# Patient Record
Sex: Male | Born: 1935
Health system: Southern US, Community
[De-identification: ages and names within clinical notes are randomized; demographics above are authoritative.]

## PROBLEM LIST (undated history)

## (undated) VITALS — BP 132/80 | HR 62 | Wt 167.0 lb

## (undated) DIAGNOSIS — E785 Hyperlipidemia, unspecified: Secondary | ICD-10-CM

## (undated) DIAGNOSIS — C4490 Unspecified malignant neoplasm of skin, unspecified: Secondary | ICD-10-CM

## (undated) DIAGNOSIS — G20 Parkinson's disease: Principal | ICD-10-CM

## (undated) DIAGNOSIS — K449 Diaphragmatic hernia without obstruction or gangrene: Secondary | ICD-10-CM

## (undated) DIAGNOSIS — G500 Trigeminal neuralgia: Secondary | ICD-10-CM

## (undated) DIAGNOSIS — T7840XA Allergy, unspecified, initial encounter: Secondary | ICD-10-CM

## (undated) DIAGNOSIS — C61 Malignant neoplasm of prostate: Secondary | ICD-10-CM

## (undated) HISTORY — DX: Diaphragmatic hernia without obstruction or gangrene: K44.9

## (undated) HISTORY — DX: Malignant neoplasm of prostate: C61

## (undated) HISTORY — DX: Trigeminal neuralgia: G50.0

## (undated) HISTORY — DX: Hyperlipidemia, unspecified: E78.5

## (undated) HISTORY — DX: Unspecified malignant neoplasm of skin, unspecified: C44.90

## (undated) HISTORY — DX: Allergy, unspecified, initial encounter: T78.40XA

## (undated) HISTORY — DX: Parkinson's disease: G20

## (undated) HISTORY — PX: CHOLECYSTECTOMY: SHX55

---

## 1997-10-03 ENCOUNTER — Other Ambulatory Visit: Admission: RE | Admit: 1997-10-03 | Discharge: 1997-10-03 | Payer: Self-pay | Admitting: Family Medicine

## 1997-10-25 ENCOUNTER — Ambulatory Visit (HOSPITAL_COMMUNITY): Admission: RE | Admit: 1997-10-25 | Discharge: 1997-10-25 | Payer: Self-pay | Admitting: *Deleted

## 1997-12-23 ENCOUNTER — Encounter: Admission: RE | Admit: 1997-12-23 | Discharge: 1998-03-23 | Payer: Self-pay | Admitting: Radiation Oncology

## 1998-06-13 ENCOUNTER — Ambulatory Visit (HOSPITAL_COMMUNITY): Admission: RE | Admit: 1998-06-13 | Discharge: 1998-06-13 | Payer: Self-pay | Admitting: Family Medicine

## 1998-06-19 ENCOUNTER — Encounter: Admission: RE | Admit: 1998-06-19 | Discharge: 1998-09-27 | Payer: Self-pay | Admitting: Family Medicine

## 1998-08-15 ENCOUNTER — Ambulatory Visit (HOSPITAL_BASED_OUTPATIENT_CLINIC_OR_DEPARTMENT_OTHER): Admission: RE | Admit: 1998-08-15 | Discharge: 1998-08-15 | Payer: Self-pay | Admitting: *Deleted

## 1998-08-15 ENCOUNTER — Encounter: Payer: Self-pay | Admitting: *Deleted

## 1998-10-06 ENCOUNTER — Ambulatory Visit (HOSPITAL_COMMUNITY): Admission: RE | Admit: 1998-10-06 | Discharge: 1998-10-06 | Payer: Self-pay | Admitting: Radiation Oncology

## 1998-10-07 ENCOUNTER — Encounter: Payer: Self-pay | Admitting: Radiation Oncology

## 1999-08-29 ENCOUNTER — Ambulatory Visit (HOSPITAL_COMMUNITY): Admission: RE | Admit: 1999-08-29 | Discharge: 1999-08-29 | Payer: Self-pay | Admitting: Neurosurgery

## 1999-08-29 ENCOUNTER — Encounter: Payer: Self-pay | Admitting: Neurosurgery

## 2000-06-14 DIAGNOSIS — C61 Malignant neoplasm of prostate: Secondary | ICD-10-CM

## 2000-06-14 HISTORY — DX: Malignant neoplasm of prostate: C61

## 2003-09-10 ENCOUNTER — Encounter: Admission: RE | Admit: 2003-09-10 | Discharge: 2003-09-10 | Payer: Self-pay | Admitting: Surgery

## 2003-09-16 ENCOUNTER — Encounter (INDEPENDENT_AMBULATORY_CARE_PROVIDER_SITE_OTHER): Payer: Self-pay | Admitting: *Deleted

## 2003-09-16 ENCOUNTER — Ambulatory Visit (HOSPITAL_COMMUNITY): Admission: RE | Admit: 2003-09-16 | Discharge: 2003-09-16 | Payer: Self-pay | Admitting: Gastroenterology

## 2004-03-06 ENCOUNTER — Inpatient Hospital Stay (HOSPITAL_COMMUNITY): Admission: RE | Admit: 2004-03-06 | Discharge: 2004-03-13 | Payer: Self-pay | Admitting: Surgery

## 2004-04-21 ENCOUNTER — Ambulatory Visit: Payer: Self-pay | Admitting: Internal Medicine

## 2004-04-21 ENCOUNTER — Ambulatory Visit (HOSPITAL_COMMUNITY): Admission: RE | Admit: 2004-04-21 | Discharge: 2004-04-21 | Payer: Self-pay | Admitting: Internal Medicine

## 2004-05-05 ENCOUNTER — Ambulatory Visit: Payer: Self-pay | Admitting: Internal Medicine

## 2004-05-06 ENCOUNTER — Ambulatory Visit: Payer: Self-pay | Admitting: Internal Medicine

## 2004-10-09 ENCOUNTER — Ambulatory Visit: Payer: Self-pay | Admitting: Internal Medicine

## 2005-02-26 ENCOUNTER — Ambulatory Visit: Payer: Self-pay | Admitting: Internal Medicine

## 2005-03-03 ENCOUNTER — Ambulatory Visit: Payer: Self-pay | Admitting: Internal Medicine

## 2005-03-16 ENCOUNTER — Ambulatory Visit: Payer: Self-pay | Admitting: Gastroenterology

## 2005-03-23 ENCOUNTER — Ambulatory Visit: Payer: Self-pay | Admitting: Gastroenterology

## 2005-03-30 ENCOUNTER — Encounter (INDEPENDENT_AMBULATORY_CARE_PROVIDER_SITE_OTHER): Payer: Self-pay | Admitting: Specialist

## 2005-03-30 ENCOUNTER — Ambulatory Visit: Payer: Self-pay | Admitting: Gastroenterology

## 2005-05-05 ENCOUNTER — Ambulatory Visit: Payer: Self-pay | Admitting: Internal Medicine

## 2006-01-18 ENCOUNTER — Ambulatory Visit: Payer: Self-pay | Admitting: Internal Medicine

## 2006-02-01 ENCOUNTER — Ambulatory Visit: Payer: Self-pay | Admitting: Internal Medicine

## 2006-03-10 ENCOUNTER — Ambulatory Visit: Payer: Self-pay | Admitting: Internal Medicine

## 2006-05-26 ENCOUNTER — Ambulatory Visit: Payer: Self-pay | Admitting: Internal Medicine

## 2006-06-30 ENCOUNTER — Encounter: Payer: Self-pay | Admitting: Internal Medicine

## 2006-09-29 DIAGNOSIS — E785 Hyperlipidemia, unspecified: Secondary | ICD-10-CM

## 2006-09-29 DIAGNOSIS — M109 Gout, unspecified: Secondary | ICD-10-CM

## 2006-09-29 DIAGNOSIS — K449 Diaphragmatic hernia without obstruction or gangrene: Secondary | ICD-10-CM | POA: Insufficient documentation

## 2006-10-11 ENCOUNTER — Ambulatory Visit: Payer: Self-pay | Admitting: Internal Medicine

## 2006-10-11 LAB — CONVERTED CEMR LAB
Creatinine, Ser: 0.9 mg/dL (ref 0.4–1.5)
Eosinophils Relative: 6.8 % — ABNORMAL HIGH (ref 0.0–5.0)
GFR calc Af Amer: 103 mL/min
Glucose, Bld: 124 mg/dL — ABNORMAL HIGH (ref 70–99)
HCT: 38 % — ABNORMAL LOW (ref 39.0–52.0)
Hemoglobin: 13 g/dL (ref 13.0–17.0)
Lymphocytes Relative: 17.9 % (ref 12.0–46.0)
MCHC: 34.3 g/dL (ref 30.0–36.0)
MCV: 96.6 fL (ref 78.0–100.0)
Monocytes Relative: 8.2 % (ref 3.0–11.0)
Neutro Abs: 3.6 10*3/uL (ref 1.4–7.7)
RBC: 3.94 M/uL — ABNORMAL LOW (ref 4.22–5.81)

## 2006-10-21 ENCOUNTER — Ambulatory Visit: Payer: Self-pay

## 2006-11-02 ENCOUNTER — Ambulatory Visit: Payer: Self-pay | Admitting: Cardiology

## 2006-11-11 ENCOUNTER — Encounter (INDEPENDENT_AMBULATORY_CARE_PROVIDER_SITE_OTHER): Payer: Self-pay | Admitting: *Deleted

## 2007-02-03 ENCOUNTER — Telehealth: Payer: Self-pay | Admitting: Internal Medicine

## 2007-03-21 ENCOUNTER — Ambulatory Visit: Payer: Self-pay | Admitting: Internal Medicine

## 2007-03-21 DIAGNOSIS — G500 Trigeminal neuralgia: Secondary | ICD-10-CM | POA: Insufficient documentation

## 2007-03-21 DIAGNOSIS — Z8546 Personal history of malignant neoplasm of prostate: Secondary | ICD-10-CM | POA: Insufficient documentation

## 2007-03-21 DIAGNOSIS — Z8719 Personal history of other diseases of the digestive system: Secondary | ICD-10-CM | POA: Insufficient documentation

## 2007-04-07 ENCOUNTER — Ambulatory Visit: Payer: Self-pay | Admitting: Internal Medicine

## 2007-08-01 ENCOUNTER — Telehealth: Payer: Self-pay | Admitting: Internal Medicine

## 2007-08-30 ENCOUNTER — Ambulatory Visit: Payer: Self-pay | Admitting: Internal Medicine

## 2007-08-30 ENCOUNTER — Telehealth (INDEPENDENT_AMBULATORY_CARE_PROVIDER_SITE_OTHER): Payer: Self-pay | Admitting: *Deleted

## 2007-08-30 DIAGNOSIS — C4490 Unspecified malignant neoplasm of skin, unspecified: Secondary | ICD-10-CM

## 2007-09-04 ENCOUNTER — Encounter: Payer: Self-pay | Admitting: Internal Medicine

## 2007-09-04 LAB — CONVERTED CEMR LAB
ALT: 19 units/L (ref 0–53)
AST: 18 units/L (ref 0–37)
Basophils Relative: 0.7 % (ref 0.0–1.0)
Cholesterol: 241 mg/dL (ref 0–200)
Direct LDL: 175.2 mg/dL
Eosinophils Absolute: 0.3 10*3/uL (ref 0.0–0.6)
Eosinophils Relative: 5.2 % — ABNORMAL HIGH (ref 0.0–5.0)
HCT: 41.3 % (ref 39.0–52.0)
HDL: 47.2 mg/dL (ref >39.0)
Hemoglobin: 13.7 g/dL (ref 13.0–17.0)
Lymphocytes Relative: 19.8 % (ref 12.0–46.0)
MCHC: 33.1 g/dL (ref 30.0–36.0)
MCV: 97 fL (ref 78.0–100.0)
Monocytes Absolute: 0.6 10*3/uL (ref 0.2–0.7)
Neutro Abs: 3.9 10*3/uL (ref 1.4–7.7)
Neutrophils Relative %: 65 % (ref 43.0–77.0)
PSA: 0.21 ng/mL (ref 0.10–4.00)
Platelets: 210 10*3/uL (ref 150–400)
Potassium: 4 meq/L (ref 3.5–5.1)
RBC: 4.25 M/uL (ref 4.22–5.81)
Saturation Ratios: 28.4 % (ref 20.0–50.0)
Sodium: 141 meq/L (ref 135–145)
WBC: 6 10*3/uL (ref 4.5–10.5)

## 2008-04-30 ENCOUNTER — Telehealth (INDEPENDENT_AMBULATORY_CARE_PROVIDER_SITE_OTHER): Payer: Self-pay | Admitting: *Deleted

## 2008-05-21 ENCOUNTER — Telehealth (INDEPENDENT_AMBULATORY_CARE_PROVIDER_SITE_OTHER): Payer: Self-pay | Admitting: *Deleted

## 2009-01-29 ENCOUNTER — Ambulatory Visit: Payer: Self-pay | Admitting: Internal Medicine

## 2009-01-29 LAB — CONVERTED CEMR LAB
Bilirubin Urine: NEGATIVE
Glucose, Urine, Semiquant: NEGATIVE
Protein, U semiquant: NEGATIVE
Specific Gravity, Urine: 1.02

## 2009-01-30 ENCOUNTER — Encounter: Payer: Self-pay | Admitting: Internal Medicine

## 2009-01-30 LAB — CONVERTED CEMR LAB
Bacteria, UA: NONE SEEN
RBC / HPF: NONE SEEN (ref <3)
WBC, UA: NONE SEEN cells/hpf (ref <3)

## 2009-01-31 ENCOUNTER — Telehealth (INDEPENDENT_AMBULATORY_CARE_PROVIDER_SITE_OTHER): Payer: Self-pay | Admitting: *Deleted

## 2009-02-19 ENCOUNTER — Encounter (INDEPENDENT_AMBULATORY_CARE_PROVIDER_SITE_OTHER): Payer: Self-pay | Admitting: *Deleted

## 2009-02-19 ENCOUNTER — Telehealth: Payer: Self-pay | Admitting: Internal Medicine

## 2009-03-06 ENCOUNTER — Ambulatory Visit: Payer: Self-pay | Admitting: Internal Medicine

## 2009-03-06 LAB — CONVERTED CEMR LAB
ALT: 17 units/L (ref 0–53)
AST: 22 units/L (ref 0–37)
Basophils Relative: 0.3 % (ref 0.0–3.0)
Calcium: 8.8 mg/dL (ref 8.4–10.5)
Cholesterol: 250 mg/dL — ABNORMAL HIGH (ref 0–200)
Eosinophils Relative: 6.6 % — ABNORMAL HIGH (ref 0.0–5.0)
HCT: 40.7 % (ref 39.0–52.0)
HDL: 46 mg/dL (ref >39.00)
Lymphs Abs: 1.3 10*3/uL (ref 0.7–4.0)
Neutro Abs: 3.2 10*3/uL (ref 1.4–7.7)
Neutrophils Relative %: 58.1 % (ref 43.0–77.0)
Potassium: 4.3 meq/L (ref 3.5–5.1)
RDW: 13.5 % (ref 11.5–14.6)
Sodium: 142 meq/L (ref 135–145)
Total CHOL/HDL Ratio: 5
Triglycerides: 104 mg/dL (ref 0.0–149.0)
Uric Acid, Serum: 7.8 mg/dL (ref 4.0–7.8)
VLDL: 20.8 mg/dL (ref 0.0–40.0)

## 2009-03-12 ENCOUNTER — Telehealth: Payer: Self-pay | Admitting: Internal Medicine

## 2009-08-18 ENCOUNTER — Ambulatory Visit: Payer: Self-pay | Admitting: Internal Medicine

## 2009-08-18 ENCOUNTER — Encounter (INDEPENDENT_AMBULATORY_CARE_PROVIDER_SITE_OTHER): Payer: Self-pay | Admitting: *Deleted

## 2009-08-20 ENCOUNTER — Ambulatory Visit: Payer: Self-pay | Admitting: Internal Medicine

## 2009-08-20 DIAGNOSIS — H6120 Impacted cerumen, unspecified ear: Secondary | ICD-10-CM | POA: Insufficient documentation

## 2009-08-20 DIAGNOSIS — J309 Allergic rhinitis, unspecified: Secondary | ICD-10-CM | POA: Insufficient documentation

## 2009-08-21 ENCOUNTER — Ambulatory Visit: Payer: Self-pay | Admitting: Internal Medicine

## 2009-09-01 ENCOUNTER — Telehealth (INDEPENDENT_AMBULATORY_CARE_PROVIDER_SITE_OTHER): Payer: Self-pay | Admitting: *Deleted

## 2009-09-01 DIAGNOSIS — E119 Type 2 diabetes mellitus without complications: Secondary | ICD-10-CM | POA: Insufficient documentation

## 2009-09-01 LAB — CONVERTED CEMR LAB
ALT: 19 units/L (ref 0–53)
AST: 20 units/L (ref 0–37)
HDL: 64.3 mg/dL (ref >39.00)
Hgb A1c MFr Bld: 6.7 % — ABNORMAL HIGH (ref 4.6–6.5)
Total CHOL/HDL Ratio: 3
Triglycerides: 86 mg/dL (ref 0.0–149.0)

## 2009-12-25 ENCOUNTER — Ambulatory Visit: Payer: Self-pay | Admitting: Internal Medicine

## 2009-12-26 ENCOUNTER — Encounter: Payer: Self-pay | Admitting: Internal Medicine

## 2009-12-29 ENCOUNTER — Telehealth (INDEPENDENT_AMBULATORY_CARE_PROVIDER_SITE_OTHER): Payer: Self-pay | Admitting: *Deleted

## 2009-12-30 ENCOUNTER — Ambulatory Visit: Payer: Self-pay | Admitting: Internal Medicine

## 2009-12-30 DIAGNOSIS — D649 Anemia, unspecified: Secondary | ICD-10-CM

## 2009-12-30 LAB — CONVERTED CEMR LAB
Ferritin: 11.3 ng/mL — ABNORMAL LOW (ref 22.0–322.0)
Iron: 91 ug/dL (ref 42–165)

## 2009-12-31 LAB — CONVERTED CEMR LAB
Basophils Absolute: 0 10*3/uL (ref 0.0–0.1)
Basophils Relative: 0.4 % (ref 0.0–3.0)
Eosinophils Absolute: 0.2 10*3/uL (ref 0.0–0.7)
Eosinophils Relative: 4.1 % (ref 0.0–5.0)
Hemoglobin: 12.9 g/dL — ABNORMAL LOW (ref 13.0–17.0)
Hgb A1c MFr Bld: 6 % (ref 4.6–6.5)
Lymphocytes Relative: 28.9 % (ref 12.0–46.0)
Lymphs Abs: 1.4 10*3/uL (ref 0.7–4.0)
MCHC: 34.7 g/dL (ref 30.0–36.0)
MCV: 94.7 fL (ref 78.0–100.0)
Monocytes Absolute: 0.5 10*3/uL (ref 0.1–1.0)
Monocytes Relative: 10.4 % (ref 3.0–12.0)

## 2010-03-03 ENCOUNTER — Encounter: Payer: Self-pay | Admitting: Internal Medicine

## 2010-03-12 ENCOUNTER — Telehealth: Payer: Self-pay | Admitting: Internal Medicine

## 2010-06-09 ENCOUNTER — Ambulatory Visit: Payer: Self-pay | Admitting: Internal Medicine

## 2010-06-09 DIAGNOSIS — R21 Rash and other nonspecific skin eruption: Secondary | ICD-10-CM

## 2010-06-09 DIAGNOSIS — N410 Acute prostatitis: Secondary | ICD-10-CM

## 2010-06-09 LAB — CONVERTED CEMR LAB
BUN: 18 mg/dL (ref 6–23)
CO2: 30 meq/L (ref 19–32)
GFR calc non Af Amer: 99.55 mL/min (ref >60.00)
Glucose, Bld: 82 mg/dL (ref 70–99)
Glucose, Urine, Semiquant: NEGATIVE
Ketones, urine, test strip: NEGATIVE
Nitrite: NEGATIVE
PSA: 3.08 ng/mL (ref 0.10–4.00)
Potassium: 4.4 meq/L (ref 3.5–5.1)
Urobilinogen, UA: 0.2
WBC Urine, dipstick: NEGATIVE

## 2010-06-10 ENCOUNTER — Encounter: Payer: Self-pay | Admitting: Internal Medicine

## 2010-06-10 LAB — CONVERTED CEMR LAB

## 2010-06-11 ENCOUNTER — Telehealth (INDEPENDENT_AMBULATORY_CARE_PROVIDER_SITE_OTHER): Payer: Self-pay | Admitting: *Deleted

## 2010-06-17 ENCOUNTER — Encounter: Payer: Self-pay | Admitting: Internal Medicine

## 2010-06-22 ENCOUNTER — Telehealth: Payer: Self-pay | Admitting: Internal Medicine

## 2010-07-02 ENCOUNTER — Ambulatory Visit: Admission: RE | Admit: 2010-07-02 | Payer: Self-pay | Source: Home / Self Care | Admitting: Internal Medicine

## 2010-07-02 ENCOUNTER — Encounter: Payer: Self-pay | Admitting: Internal Medicine

## 2010-07-12 LAB — CONVERTED CEMR LAB
AST: 23 units/L (ref 0–37)
Glucose, Bld: 103 mg/dL — ABNORMAL HIGH (ref 70–99)
HCT: 40.2 % (ref 39.0–52.0)
Hemoglobin: 13.3 g/dL (ref 13.0–17.0)
PSA: 0.1 ng/mL (ref 0.10–4.00)
RBC: 4.11 M/uL — ABNORMAL LOW (ref 4.22–5.81)
Uric Acid, Serum: 5.1 mg/dL (ref 2.4–7.0)

## 2010-07-16 NOTE — Medication Information (Signed)
Summary: [facility]  [facility]   Imported By: Lanelle Bal 03/11/2010 08:26:45  _____________________________________________________________________  External Attachment:    Type:   Image     Comment:   External Document

## 2010-07-16 NOTE — Letter (Signed)
Summary: {SA better, RTC 6 mo--- [facility]  [facility]   Imported By: Lanelle Bal 06/29/2010 10:44:25  _____________________________________________________________________  External Attachment:    Type:   Image     Comment:   External Document

## 2010-07-16 NOTE — Assessment & Plan Note (Signed)
Summary: rto 3 months.cbs   Vital Signs:  Patient profile:   77 year old male Weight:      163.25 pounds Pulse rate:   55 / minute Pulse rhythm:   regular BP sitting:   122 / 78  (left arm) Cuff size:   large  Vitals Entered By: Army Fossa CMA (December 25, 2009 10:47 AM) CC: Pt here for follow up on DM Comments Had Juice this am- no food.   History of Present Illness: followup from the last office visit, he was diagnosed with diabetes He has changed his diet, he is eating healthier, more salads, less carbohydrates. Feeling well  Allergies (verified): No Known Drug Allergies  Past History:  Past Medical History: DM-- dx 3-11, A1C 6.7 Gout Hyperlipidemia Hx of TRIGEMINAL NEURALGIA   ADENOCARCINOMA, PROSTATE---> 2002, s/p XRT- surgery (operative report not available) HIATAL HERNIA skin cancer neg cardiolite 01-2004 Carotid u/s 10-2006: 0-39%, next 1 year Allergic rhinitis  Review of Systems       he went to [place], while in [place] he became  severely constipated On September 22, 2009 he went to a local hospital, he reports normal x-rays, he  has labs reports with him from the hospital in [place]: Hemoglobin was 12, creatinine 0.8, sugar 105 Endo:  he held  simvastatin for a few weeks because he was constipated but now is back on it.  Physical Exam  General:  alert and well-developed.   Lungs:  normal respiratory effort, no intercostal retractions, no accessory muscle use, and normal breath sounds.   Heart:  normal rate, regular rhythm, and no murmur.   Abdomen:  soft, non-tender, no distention, no masses, no guarding, and no rigidity.   Psych:  Oriented X3, not anxious appearing, and not depressed appearing.     Impression & Recommendations:  Problem # 1:  DM (ICD-250.00) recently dx  diet has improved substantially, has lost almost 30 pounds Labs ( including a CBC, his hemoglobin was slightly low in April)  Labs Reviewed: Creat: 1.0 (03/06/2009)    Reviewed  HgBA1c results: 6.7 (08/21/2009)  Orders: Venipuncture (98119) TLB-A1C / Hgb A1C (Glycohemoglobin) (83036-A1C) TLB-CBC Platelet - w/Differential (85025-CBCD) Specimen Handling (14782)  Problem # 2:  ADENOCARCINOMA, PROSTATE, HX OF (ICD-V10.46) encouraged to see urology  Complete Medication List: 1)  Simvastatin 40 Mg Tabs (Simvastatin) .[name] Kitchen.. 1 by mouth at bedtime 2)  Allopurinol 300 Mg Tabs (Allopurinol) .... Take one tablet daily  Patient Instructions: 1)  please see your urologist 2)   Please schedule a follow-up appointment in 4 months .

## 2010-07-16 NOTE — Progress Notes (Signed)
Summary: rx for ankle  Phone Note From Other Clinic   Caller: [facility] Summary of Call: VM left stating that they were calling to check status of rx for ankle that was sent over. call back [phone] ext [phone]. pls advise if form received.................[name] KitchenFelecia Deloach CMA  March 12, 2010 12:58 PM   Follow-up for Phone Call        I do not have a form, Dr.Paz do you have a form?  Follow-up by: Army Fossa CMA,  March 12, 2010 1:24 PM  Additional Follow-up for Phone Call Additional follow up Details #1::        I have a request for an ankle orthosis, he does not have any diagnosis in the chart that support that prescription; if you find one, let  me know. Otherwise we'll discuss that on return to the office Additional Follow-up by: [facility] E. Paz MD,  March 13, 2010 2:54 PM    Additional Follow-up for Phone Call Additional follow up Details #2::    Left message for [facility]. I do not see anything on pts problem list, also left message for pt... Army Fossa CMA  March 13, 2010 2:57 PM

## 2010-07-16 NOTE — Assessment & Plan Note (Signed)
Summary: followup/kn   Vital Signs:  Patient profile:   77 year old male Weight:      167 pounds Pulse rate:   62 / minute Pulse rhythm:   regular BP sitting:   132 / 80  (left arm) Cuff size:   regular  Vitals Entered By: Army Fossa CMA (June 09, 2010 11:23 AM) CC: follow up visit- fasting  Comments c/o problems urinating- unable to tell me exactly whats going on. [facility]    History of Present Illness: routine office visit  4 days history of " slow urine", some dysuria, no frequency. On chart review, he has a history of prostate cancer, PSA was elevated at 02-2009, he was referred to urology, the patient reports he did go but  we were never able to get any reports.  DM--on diet only  , due for labs   Gout-- was off allopurinol x 2 months while visiting [place]  (Oct. and November), restarted meds , feeling well , no acute gout  episodes   Hyperlipidemia-- good medication compliance     ROS Denies fevers or chills No nausea or vomiting No gross hematuria Denies chest pain, shortness of breath or lower extremity edema No cough or chest congestion no back pain    Current Medications (verified): 1)  Simvastatin 40 Mg Tabs (Simvastatin) .[name] Kitchen.. 1 By Mouth At Bedtime 2)  Allopurinol 300 Mg Tabs (Allopurinol) .... Take One Tablet Daily  Allergies (verified): No Known Drug Allergies  Past History:  Past Medical History: DM-- dx 3-11, A1C 6.7 Gout Hyperlipidemia Hx of TRIGEMINAL NEURALGIA   ADENOCARCINOMA, PROSTATE---> 2002, s/p XRT- surgery (operative report not available) HIATAL HERNIA skin cancer neg cardiolite 01-2004 Carotid u/s 10-2006: 0-39%, next 1 year Allergic rhinitis  Past Surgical History: Reviewed history from 09/29/2006 and no changes required. Cholecystectomy  Social History: Reviewed history from 03/06/2009 and no changes required. 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   Lungs:  normal respiratory effort, no intercostal retractions, no accessory muscle use, and normal breath sounds.   Heart:  normal rate, regular rhythm, and no murmur.   Abdomen:  soft, non-tender, no masses, no guarding, and no rigidity.   Rectal:  what seems to be his prostate gland is firm, nonnodular, slightly tende and  enlarged   Impression & Recommendations:  Problem # 1:  ADENOCARCINOMA, PROSTATE, HX OF (ICD-V10.46) patient presents with urinary symptoms for 4 days On chart review, PSA was elevated at 02-2009, he was referred to urology, the patient reports he did go but  we were never able to get any reports. symptoms may be related to prostatitis versus local  prostate cancer recurrence plan: PSA Antibiotics re-refer to urology  Orders: TLB-PSA (Prostate Specific Antigen) ([address]-UEA) Urology Referral (Urology)  Problem # 2:  DM (ICD-250.00) due for labs  Labs Reviewed: Creat: 1.0 (03/06/2009)    Reviewed HgBA1c results: 6.0 (12/25/2009)  6.7 (08/21/2009)  Orders: Venipuncture (54098) TLB-A1C / Hgb A1C (Glycohemoglobin) (83036-A1C)  Problem # 3:  GOUT (ICD-274.9) poor compliance with meds for 2 months but he is back on allopurinol. Labs  His updated medication list for this problem includes:    Allopurinol 300 Mg Tabs (Allopurinol) .[name] Kitchen... Take one tablet daily  Orders: TLB-BMP (Basic Metabolic Panel-BMET) (80048-METABOL) TLB-ALT (SGPT) (84460-ALT) TLB-AST (SGOT) (84450-SGOT) TLB-Uric  Acid, Blood (84550-URIC)  Problem # 4:  HYPERLIPIDEMIA (ICD-272.4) well controlled per last cholesterol panel His updated medication list for this problem includes:    Simvastatin 40 Mg Tabs (Simvastatin) .[name] Kitchen... 1 by mouth at bedtime  Labs Reviewed: SGOT: 20 (08/21/2009)   SGPT: 19 (08/21/2009)   HDL:64.30 (08/21/2009), 46.00 (03/06/2009)   LDL:84 (08/21/2009), DEL (16/03/9603)  Chol:165 (08/21/2009), 250 (03/06/2009)  Trig:86.0 (08/21/2009), 104.0 (03/06/2009)  Problem # 5:  RASH-NONVESICULAR (ICD-782.1) has episodic rash n the groin: see Rx to be use PRN His updated medication list for this problem includes:    Lotrisone 1-0.05 % Crea (Clotrimazole-betamethasone) .[name] Kitchen... Apply twice a day as needed  Problem # 6:  PROSTATITIS, ACUTE (ICD-601.0) acute prostatitis? see #1 Orders: T-Urine Microscopic (54098-11914) T-Culture, Urine (78295-62130) UA Dipstick w/o Micro (automated)  (81003)  Complete Medication List: 1)  Simvastatin 40 Mg Tabs (Simvastatin) .[name] Kitchen.. 1 by mouth at bedtime 2)  Allopurinol 300 Mg Tabs (Allopurinol) .... Take one tablet daily 3)  Cipro 500 Mg Tabs (Ciprofloxacin hcl) .... One by mouth twice a day 4)  Lotrisone 1-0.05 % Crea (Clotrimazole-betamethasone) .... Apply twice a day as needed  Patient Instructions: 1)  it is very important that you see the urologist. We will make an appointment. Please keep the appointment 2)  Come back in 4 months Prescriptions: LOTRISONE 1-0.05 % CREA (CLOTRIMAZOLE-BETAMETHASONE) apply twice a day as needed  #1 x 0   Entered and Authorized by:   Elita Quick E. Sophya Vanblarcom MD   Signed by:   Nolon Rod. Jolita Haefner MD on 06/09/2010   Method used:   Print then Give to Patient   RxID:   [id number] CIPRO 500 MG TABS (CIPROFLOXACIN HCL) one by mouth twice a day  #20 x 0   Entered and Authorized by:   Nolon Rod. Zacari Stiff MD   Signed by:   Nolon Rod. Kalleigh Harbor MD on 06/09/2010   Method used:   Print then Give to Patient   RxID:   [id number]    Orders Added: 1)  Venipuncture [36415] 2)  TLB-PSA (Prostate Specific Antigen) [84153-PSA] 3)  TLB-A1C / Hgb A1C (Glycohemoglobin) [83036-A1C] 4)  TLB-BMP (Basic Metabolic Panel-BMET) [80048-METABOL] 5)  TLB-ALT (SGPT) [84460-ALT] 6)  TLB-AST (SGOT) [84450-SGOT] 7)  TLB-Uric Acid, Blood [84550-URIC] 8)  T-Urine Microscopic [03474-25956] 9)  T-Culture, Urine  [38756-43329] 10)  UA Dipstick w/o Micro (automated)  [81003] 11)  Urology Referral [Urology] 12)  Est. Patient Level IV [51884]    Laboratory Results   Urine Tests    Routine Urinalysis   Color: yellow Appearance: Clear Glucose: negative   (Normal Range: Negative) Bilirubin: negative   (Normal Range: Negative) Ketone: negative   (Normal Range: Negative) Spec. Gravity: 1.020   (Normal Range: 1.003-1.035) Blood: negative   (Normal Range: Negative) pH: 6.5   (Normal Range: 5.0-8.0) Protein: negative   (Normal Range: Negative) Urobilinogen: 0.2   (Normal Range: 0-1) Nitrite: negative   (Normal Range: Negative) Leukocyte Esterace: negative   (Normal Range: Negative)    Comments: Army Fossa CMA  June 09, 2010 11:28 AM

## 2010-07-16 NOTE — Progress Notes (Signed)
Summary: Meds- unable to communicate well w/ me  Phone Note Call from Patient Call back at Home Phone [phone]   Caller: Patient Summary of Call: I spoke w/ pt- due to [place] getting a refill for Cipro. When speaking with the pt there was a language barrier. He states he is really unsure as to what he needs due to him not being able to communicate well w/ me.  Would you be able to call the pt to see what meds he needs?  Initial call taken by: Army Fossa CMA,  June 22, 2010 8:48 AM  Follow-up for Phone Call        needs a refill on allopurinol and simvastatin, okay to refill for 6 months Follow-up by: [facility] E. Seena Face MD,  June 22, 2010 12:41 PM    Prescriptions: ALLOPURINOL 300 MG TABS (ALLOPURINOL) take one tablet daily  #30 x 6   Entered by:   Army Fossa CMA   Authorized by:   Nolon Rod. Savahanna Almendariz MD   Signed by:   Army Fossa CMA on 06/22/2010   Method used:   Electronically to        [facility]. [phone]* (retail)       [address]       [place], [place]  [address]       Ph: [phone]       Fax: [phone]   RxID:   [id number] SIMVASTATIN 40 MG TABS (SIMVASTATIN) 1 by mouth at bedtime  #30 Tablet x 6   Entered by:   Army Fossa CMA   Authorized by:   Nolon Rod. Catcher Dehoyos MD   Signed by:   Army Fossa CMA on 06/22/2010   Method used:   Electronically to        [facility]. [phone]* (retail)       [address]       [place], [place]  [address]       Ph: [phone]       Fax: [phone]   RxID:   [id number]

## 2010-07-16 NOTE — Assessment & Plan Note (Signed)
Summary: roa per pt//lch   Vital Signs:  Patient profile:   77 year old male Weight:      190 pounds Pulse rate:   60 / minute BP sitting:   110 / 60  Vitals Entered By: Shary Decamp (August 20, 2009 4:07 PM) CC: rov Comments  - both ears cleaned out Shary Decamp  August 20, 2009 4:18 PM    History of Present Illness: follow-up from the last office visit needs his ears clean  prostate cancer  : reports that he saw a  practitioner at the urology office, he was prescribed a medication (?name), he was recommended to return to the office in 6 months  high cholesterol: Based on the last labs he was a started on simvastatin, reports good tolerance  complaining off "frequent   throat clearing" , unsure if the sputum is coming from the nose or the chest  Current Medications (verified): 1)  Simvastatin 40 Mg Tabs (Simvastatin) .[name] Kitchen.. 1 By Mouth At Bedtime  Allergies (verified): No Known Drug Allergies  Past History:  Past Medical History: Gout Hyperlipidemia Hx of TRIGEMINAL NEURALGIA   ADENOCARCINOMA, PROSTATE---> 2002, s/p XRT- surgery (operative report not available) HIATAL HERNIA skin cancer neg cardiolite 01-2004 Carotid u/s 10-2006: 0-39%, next 1 year Allergic rhinitis  Past Surgical History: Reviewed history from 09/29/2006 and no changes required. Cholecystectomy  Social History: Reviewed history from 03/06/2009 and no changes required. 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 ENT:  occ  has a nosebleed. Resp:  no cough or chest congestion.  Physical Exam  General:  alert and well-developed.   Head:  face symmetric, nontender to palpation Ears:  ears were mashed bilaterally, after the lavage they were completely normal.  TMs normal as well Nose:  mild nasal congestion Lungs:  normal respiratory effort, no intercostal retractions,  no accessory muscle use, and normal breath sounds.   Heart:  normal rate, regular rhythm, and no murmur.     Impression & Recommendations:  Problem # 1:  CERUMEN IMPACTION, BILATERAL (ICD-380.4) lavaged  today Orders: Cerumen Impaction Removal (95284)  Problem # 2:  ADENOCARCINOMA, PROSTATE, HX OF (ICD-V10.46) will try to get urology note he is taking a medication as prescribed by urology, name?  Problem # 3:  HYPERLIPIDEMIA (ICD-272.4) reportedly good compliance with simvastatin labs His updated medication list for this problem includes:    Simvastatin 40 Mg Tabs (Simvastatin) .[name] Kitchen... 1 by mouth at bedtime  Problem # 4:  ALLERGIC RHINITIS (ICD-477.9)  frequent "phlegm" in the throat, some nasal congestion,  ?Allergies trial with Flonase  His updated medication list for this problem includes:    Flonase 50 Mcg/act Susp (Fluticasone propionate) .[name] Kitchen... 2 sprays on each side of the nose once daily  Problem # 5:  HYPERGLYCEMIA, FASTING (ICD-790.29) last fasting CBG is slightly elevated,  check a hemoglobin A1c  Complete Medication List: 1)  Simvastatin 40 Mg Tabs (Simvastatin) .[name] Kitchen.. 1 by mouth at bedtime 2)  Flonase 50 Mcg/act Susp (Fluticasone propionate) .... 2 sprays on each side of the nose once daily  Patient Instructions: 1)  please come back fasting: 2)  FLP , AST, ALT--- dx  high cholesterol  3)  hemoglobin A1c----dx  hyperglycemia 4)  start taking Flonase for nasal congestion 5)  Please schedule a follow-up appointment in 4 months .  Prescriptions: SIMVASTATIN 40 MG  TABS (SIMVASTATIN) 1 by mouth at bedtime  #30 Tablet x 6   Entered and Authorized by:   Nolon Rod. Jerris Fleer MD   Signed by:   Nolon Rod. Gibson Lad MD on 08/20/2009   Method used:   Print then Give to Patient   RxID:   [id number] FLONASE 50 MCG/ACT SUSP (FLUTICASONE PROPIONATE) 2 sprays on each side of the nose once daily  #1 x 6   Entered and Authorized by:   Nolon Rod. Leeah Politano MD   Signed by:   Nolon Rod. Delina Kruczek MD on  08/20/2009   Method used:   Print then Give to Patient   RxID:   [id number]

## 2010-07-16 NOTE — Progress Notes (Signed)
Summary: labs  Phone Note Outgoing Call   Call placed by: Army Fossa CMA,  December 29, 2009 4:12 PM Summary of Call: Pt needs to come in for labs:  Iron-Ferretin dx anemia. Army Fossa CMA  December 29, 2009 4:12 PM   Follow-up for Phone Call        [email or web address] Caldwell Negrete  December 29, 2009 4:53 PM  Additional Follow-up for Phone Call Additional follow up Details #1::        PATIENT CAME IN TO ASK QUESTION, MADE APPOINTMENT AND SENT HIM TO LAB////SPH Additional Follow-up by: Jerolyn Shin,  December 30, 2009 12:29 PM

## 2010-07-16 NOTE — Progress Notes (Signed)
Summary: lab results   Phone Note Outgoing Call   Summary of Call: Uric acid is actually low. He has a history of rather persistent-severe gout  before he started medication. Advise  patient: --DM stable  --Continue with allopurinol as before --PSA is elevated, finish antibiotics consider urologist as recommended --fax PSA, UA, UCX   to urology Jose E. Paz MD  June 11, 2010 1:09 PM     Follow-up for Phone Call        left message for pt to call back. Army Fossa CMA  June 11, 2010 1:33 PM   Additional Follow-up for Phone Call Additional follow up Details #1::        left message for pt to call back. Army Fossa CMA  June 12, 2010 2:26 PM     Additional Follow-up for Phone Call Additional follow up Details #2::    left message for pt to call back. Army Fossa CMA  June 16, 2010 2:43 PM  Patient's wife is aware.Harold Barban  June 17, 2010 1:19 PM

## 2010-07-16 NOTE — Assessment & Plan Note (Signed)
Summary: VISIT CANCELLED /drb   Vital Signs:  Patient profile:   77 year old male Weight:      167.13 pounds (75.97 kg) Temp:     97.6 degrees F (36.44 degrees C) oral BP sitting:   120 / 78  (left arm) Cuff size:   regular  Vitals Entered By: Lucious Groves CMA (July 02, 2010 3:43 PM) CC: DM foot exam./kb Is Patient Diabetic? Yes Pain Assessment Patient in pain? no      Comments Patient states that he does not know why he is here.   History of Present Illness: NO NEED FOR A VISIT TODAY PT DOING WELL , NO C/O  RTC 4 MONTHS   Current Medications (verified): 1)  Simvastatin 40 Mg Tabs (Simvastatin) .[name] Kitchen.. 1 By Mouth At Bedtime 2)  Allopurinol 300 Mg Tabs (Allopurinol) .... Take One Tablet Daily 3)  Lotrisone 1-0.05 % Crea (Clotrimazole-Betamethasone) .... Apply Twice A Day As Needed  Allergies (verified): No Known Drug Allergies  Past History:  Past Medical History: Reviewed history from 06/09/2010 and no changes required. DM-- dx 3-11, A1C 6.7 Gout Hyperlipidemia Hx of TRIGEMINAL NEURALGIA   ADENOCARCINOMA, PROSTATE---> 2002, s/p XRT- surgery (operative report not available) HIATAL HERNIA skin cancer neg cardiolite 01-2004 Carotid u/s 10-2006: 0-39%, next 1 year Allergic rhinitis  Past Surgical History: Reviewed history from 09/29/2006 and no changes required. Cholecystectomy  Social History: Reviewed history from 06/09/2010 and no changes required. 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 1 by mouth at bedtime 2)  Allopurinol 300 Mg Tabs (Allopurinol) .... Take one tablet daily 3)  Lotrisone 1-0.05 % Crea (Clotrimazole-betamethasone) .... Apply twice a day as needed  Other Orders: No Charge Patient Arrived (NCPA0) (NCPA0)   Orders Added: 1)  No Charge  Patient Arrived (NCPA0) [NCPA0]

## 2010-07-16 NOTE — Progress Notes (Signed)
Summary: results  Phone Note Outgoing Call   Call placed by: Doristine Devoid,  September 01, 2009 3:06 PM Call placed to: Patient Summary of Call: advise patient: cholesterol wel controlled, continue w/ meds has developed DM: diet-exercsie recommended, offer the patient a nutritionist referal  RTC 3 months  Follow-up for Phone Call        left message on machine ...[name] KitchenMarland KitchenDoristine Devoid  September 01, 2009 3:06 PM  left message on machine for pt to return call Shary Decamp  September 02, 2009 4:49 PM  discussed with pt -- referral done, DM info mailed to pt [facility]  September 04, 2009 10:38 AM   New Problems: DM (ICD-250.00)   New Problems: DM (ICD-250.00)

## 2010-07-16 NOTE — Letter (Signed)
Summary: [place] No Show Letter  [facility] [place]  [address] [place], [place] [address]   Phone: [phone]  Fax: [phone]    08/18/2009 MRN: [id number]  [facility] [address] [place], [place]  [address]   Dear Mr. Baus,   Our records indicate that you missed your scheduled appointment with Dr. Drue Novel on 08/18/09.  Please contact this office to reschedule your appointment as soon as possible.  It is important that you keep your scheduled appointments with your physician, so we can provide you the best care possible.  Please be advised that there may be a charge for "no show" appointments.    Sincerely,   [place] [facility]

## 2010-07-16 NOTE — Medication Information (Signed)
Summary: [facility] Supp[facility]  Diabetes Supplies/[facility]   Imported By: Lanelle Bal 01/06/2010 09:50:26  _____________________________________________________________________  External Attachment:    Type:   Image     Comment:   External Document

## 2010-07-16 NOTE — Medication Information (Signed)
Summary: [facility] Supp[facility]  [facility]   Imported By: Lanelle Bal 01/01/2010 13:18:09  _____________________________________________________________________  External Attachment:    Type:   Image     Comment:   External Document

## 2010-10-30 NOTE — Op Note (Signed)
NAMEHERNAN, [name]                            ACCOUNT NO.:  [id number]   MEDICAL RECORD NO.:  [id number]                   PATIENT TYPE:  AMB   LOCATION:  ENDO                                 FACILITY:  [facility]   PHYSICIAN:  Petra Kuba, M.D.                 DATE OF BIRTH:  [birth year]   DATE OF PROCEDURE:  09/16/2003  DATE OF DISCHARGE:                                 OPERATIVE REPORT   PROCEDURE PERFORMED:  Esophagogastroduodenoscopy with biopsy.   ENDOSCOPIST:  Petra Kuba, M.D.   INDICATIONS FOR PROCEDURE:  Patient with abnormal x-ray.  [name],  M.D. requesting esophagogastroduodenoscopy prior to surgical options.  Consent was signed after the risks, benefits, methods and options were  thoroughly discussed in the office with both the patient and his wife.   MEDICINES USED:  Demerol 40 mg, Versed 4 mg.   DESCRIPTION OF PROCEDURE:  The video endoscope was inserted by direct  vision.  Proximal esophagus was normal.  In the midesophagus, the  midesophageal small  to medium-sized diverticula were seen.  There was  nothing abnormal about it.  Scope passed into the stomach.  The anatomy was  deformed based on his large hernia and the abnormal rotation but we were  able to advance through the antrum through a normal pylorus into a normal  duodenal bulb and around the C-loop to a normal second portion of the  duodenum.  The scope was withdrawn back to the bulb and a good look there  ruled out ulcers in all locations.  Scope was withdrawn back to the stomach.  There was a small antral polyp which was cold biopsied times two.  Retroflex  and straight visualization confirmed the hiatal hernia but did not reveal  any additional findings.  Complete evaluation of the stomach was difficult  based on the deformed anatomy but no other abnormalities were seen.  Air was  suctioned, scope was slowly withdrawn.  Again, a good look at the esophagus  confirmed the above findings.  Scope  was removed.  The patient tolerated the  procedure well.  There were no obvious immediate complication.   ENDOSCOPIC DIAGNOSIS:  1. Large hiatal hernia.  2. Deformed anatomy due to his malrotation.  3. Midesophageal diverticulum, small to medium.  4. Small antral polyp cold biopsied times two.  5. Otherwise normal esophagogastroduodenoscopy.   PLAN:  Await pathology.  Surgical options per Dr. Daphine Deutscher.  Will be on  standby to help p.r.n.  Happy to see back as needed.                                               Petra Kuba, M.D.    MEM/MEDQ  D:  09/16/2003  T:  09/17/2003  Job:  [id number]   cc:   [name], M.D.  [address], [address]  [place]  [place] [address]  Fax: [phone]

## 2010-10-30 NOTE — Op Note (Signed)
[name], [name]                ACCOUNT NO.:  [id number]   MEDICAL RECORD NO.:  [id number]          PATIENT TYPE:  OBV   LOCATION:  0156                         FACILITY:  [facility]   PHYSICIAN:  Thornton Park. Daphine Deutscher, M.D.DATE OF BIRTH:  13-Nov-1918   DATE OF PROCEDURE:  DATE OF DISCHARGE:                                 OPERATIVE REPORT   PREOPERATIVE DIAGNOSIS:  Giant hiatal hernia with entire gastric volvulus  within the chest, as evidenced on upper gastrointestinal.   POSTOPERATIVE DIAGNOSIS:  Giant hiatal hernia with entire gastric volvulus  within the chest, as evidenced on upper gastrointestinal.   PROCEDURE:  Laparoscopic take-down of giant hiatal hernia with repair of  diaphragmatic defect with Gore-Tex dual-mesh patch and gastropexy.   SURGEON:  Thornton Park. Daphine Deutscher, M.D.   ASSISTANT:  Velora Heckler, M.D.   ANESTHESIA:  General endotracheal.   OPERATIVE TIME:  Two hours.   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  He was seen and evaluated.  An upper GI at  that time showed his stomach to be in a volvulus pattern up in the chest.  [place], [place] he was referred to me.  We evaluated him  with a chest CT, which confirmed this, and also to make sure that he did not  have active TB, since he did have a diverticulum of his proximal esophagus.  The patient was taken to room #11, where the abdomen was prepped with  Betadine and draped sterilely.  I entered the abdomen and left upper  quadrant with a 0 degree 11 Optiview without difficulty.  The abdomen was  insufflated, then two trocars were placed in the right upper quadrant, one  in the upper midline for the liver retractor, and then one slightly to the  left of the umbilicus for the camera.  Photos are taken of the stomach  herniated up into the chest, and these  are reduced.  A harmonic scalpel was  used to detach the stomach from the peritoneal attachments, holding it up  into the chest.  There was quite a large hernia sac present.  A 40 lighted  bougie was passed to help identify the esophagus.  I felt that with the  patient's age and friability of this region, to try and get around posterior  to the esophagus was not going to be helpful, and also it appeared that his  esophageal length was maintained, and the esophagus was actually into the  abdomen.  A large anterior defect was not going to come together primarily.  My goal was to restore the stomach to the abdomen, and I did this by cutting  a semi-circular piece of mesh to fit the diaphragmatic defect and then  suturing it in place with nine sutures using an Endo stitch.  These placed  and patched nicely over the defect.  The stomach was then tacked anteriorly  to this area, mainly along the lateral  segment of the liver attachment to  the diaphragm, as the diaphragm itself was felt to be too weak to try to  withstand a full-thickness suture.  This tacked the stomach anteriorly, and  posteriorly, it remained fixed, so therefore we felt it would be unlikely  that it would herniate posteriorly.  Anterior pexy was then performed more  distally with full-thickness bites to the stomach, and they were grabbed  with Endo-Close device, pulled up, and tied to the anterior abdominal wall.  No untoward recurrences were felt to be present.  I did have a little fatty  mass that I removed from the stomach, which I pulled out and extracted with  the bag.   Patient seemed to tolerate the procedure well.  Was taken to the recovery  room in satisfactory condition.  He will be taken to the intensive care unit  for postoperative observation.      MBM/MEDQ  D:  03/06/2004  T:  03/06/2004  Job:  [id number]   cc:   Danae Orleans. Venetia Maxon, M.D.  [address]  [place]  [place] [address]  Fax: [phone]   Petra Kuba,  M.D.  [address] [place] [address], [address]  [place]  [place] [address]  Fax: [phone]   Quita Skye. Artis Flock, M.D.  [address]  [place]  [place] [address]  Fax: [phone]

## 2010-10-30 NOTE — Discharge Summary (Signed)
NAMEANTOLIN, BELSITO                ACCOUNT NO.:  [id number]   MEDICAL RECORD NO.:  [id number]          PATIENT TYPE:  INP   LOCATION:  0446                         FACILITY:  [facility]   PHYSICIAN:  Thornton Park. Daphine Deutscher, MD  DATE OF BIRTH:  06-Jan-1919   DATE OF ADMISSION:  03/06/2004  DATE OF DISCHARGE:  03/13/2004                                 DISCHARGE SUMMARY   PROCEDURE:  Laparoscopic repair of giant hiatal hernia with Gore-Tex dual-  mesh patch of the diaphragm and with distal gastropexy (x2).   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  He was taken to the operating room where he  underwent the above-mentioned laparoscopic repair of a hiatal hernia with  reduction of the stomach into the abdomen and removal of the hernia sac.  Because the defect was so large that it was not amenable to primary repair,  a Gore-Tex patch was placed and sewn to the perimeter of the defect.  Distal  stomach was pexed.  Postoperatively, he had a swallow obtained which showed  no evidence of a leak.  He was advanced to a full liquid and then to a soft  diet.  He was restarted on his Neurontin for his tic douloureux.  He did  have some Reglan, although he was discharged home on Reglan, which would  help his gastric emptying.  He seemed to be doing well at the time of his  discharge on March 13, 2004, which was postop day #7.   CONDITION ON DISCHARGE:  Improved.     Matt   MBM/MEDQ  D:  04/07/2004  T:  04/07/2004  Job:  [id number]   cc:   Danae Orleans. Venetia Maxon, M.D.  [address]  [place]  [place] [address]  Fax: [phone]   Petra Kuba, M.D.  [address], [address]  [place]  [place] [address]  Fax: [phone]   Quita Skye. Artis Flock, M.D.  [address]  [place]  [place] [address]  Fax: [phone]

## 2011-03-17 ENCOUNTER — Ambulatory Visit (INDEPENDENT_AMBULATORY_CARE_PROVIDER_SITE_OTHER)
Admission: RE | Admit: 2011-03-17 | Discharge: 2011-03-17 | Disposition: A | Discharge status: Home / Self Care | Payer: Medicare Other | Source: Ambulatory Visit | Attending: Internal Medicine | Admitting: Internal Medicine

## 2011-03-17 ENCOUNTER — Ambulatory Visit (INDEPENDENT_AMBULATORY_CARE_PROVIDER_SITE_OTHER): Payer: Medicare Other | Admitting: Internal Medicine

## 2011-03-17 ENCOUNTER — Encounter: Payer: Self-pay | Admitting: Internal Medicine

## 2011-03-17 VITALS — BP 120/80 | HR 60 | Temp 97.5°F | Resp 14 | Wt 166.0 lb

## 2011-03-17 DIAGNOSIS — J40 Bronchitis, not specified as acute or chronic: Secondary | ICD-10-CM

## 2011-03-17 MED ORDER — AZITHROMYCIN 250 MG PO TABS: ORAL_TABLET | ORAL | Status: AC

## 2011-03-17 NOTE — Patient Instructions (Signed)
tomese la radiografia mucinex DM 1 o 2 veces al dia Zpack: antibiotico x 5 dias Albuterol: 2 puffs 4 veces al dia para la tos y congestion de pecho, solo si lo necesita regrese si se Personal assistant or no mejora dentro de los siguientes 3 dias necesita un exam de rutina, regrese en 2 semanas, en ayunas

## 2011-03-17 NOTE — Progress Notes (Signed)
  Subjective:    Patient ID: [name], male    DOB: 1919/01/11, 77 y.o.   MRN: [id number]  HPI Last OV 05-2010 Acute visit due to cough On and off cough for 2 months, has been bringing up white/green sputum. Was on OTC antihistaminics?  High cholesterol--run out of medicines a few months ago Gout -run out of bases a few months ago, no episodes  Past Medical History: DM-- dx 3-11, A1C 6.7 Gout Hyperlipidemia Hx of TRIGEMINAL NEURALGIA   ADENOCARCINOMA, PROSTATE---> 2002, s/p XRT- surgery (operative report not available) HIATAL HERNIA skin cancer neg cardiolite 01-2004 Carotid u/s 10-2006: 0-39%, next 1 year Allergic rhinitis  Past Surgical History: Reviewed history from 09/29/2006 and no changes required. Cholecystectomy    Review of Systems Denies fevers, shortness of breath, chest pain or lower extremity edema    Objective:   Physical Exam  Constitutional: He is oriented to person, place, and time. He appears well-developed and well-nourished. No distress.  HENT:  Head: Normocephalic and atraumatic.       Throat without redness, ears with some wax, nose is slightly congested, face symmetric and nontender to palpation  Cardiovascular: Normal rate and normal heart sounds.   No murmur heard. Pulmonary/Chest:       Rhonchi bilaterally, clear with cough. Few end expiratory wheezes bilaterally. No crackles, no increased work of breathing  Musculoskeletal: He exhibits no edema.  Neurological: He is alert and oriented to person, place, and time.  Skin: He is not diaphoretic.          Assessment & Plan:  Bronchitis: Physical exam consistent with bronchitis, symptoms however are on and off for 2 months. Plan: Chest x-ray, Z-Pak, samples of albuterol. See instructions Also ,recommended to come back in few days for routine office visit

## 2011-03-31 ENCOUNTER — Encounter: Payer: Self-pay | Admitting: Internal Medicine

## 2011-03-31 ENCOUNTER — Ambulatory Visit (INDEPENDENT_AMBULATORY_CARE_PROVIDER_SITE_OTHER): Payer: Medicare Other | Admitting: Internal Medicine

## 2011-03-31 VITALS — BP 96/68 | HR 63 | Temp 97.6°F | Resp 14 | Wt 167.5 lb

## 2011-03-31 DIAGNOSIS — Z0000 Encounter for general adult medical examination without abnormal findings: Secondary | ICD-10-CM | POA: Insufficient documentation

## 2011-03-31 DIAGNOSIS — Z23 Encounter for immunization: Secondary | ICD-10-CM

## 2011-03-31 DIAGNOSIS — M109 Gout, unspecified: Secondary | ICD-10-CM

## 2011-03-31 DIAGNOSIS — Z8546 Personal history of malignant neoplasm of prostate: Secondary | ICD-10-CM

## 2011-03-31 DIAGNOSIS — E119 Type 2 diabetes mellitus without complications: Secondary | ICD-10-CM

## 2011-03-31 DIAGNOSIS — E785 Hyperlipidemia, unspecified: Secondary | ICD-10-CM

## 2011-03-31 LAB — LIPID PANEL
Cholesterol: 253 mg/dL — ABNORMAL HIGH (ref 0–200)
HDL: 60.4 mg/dL (ref >39.00)
Total CHOL/HDL Ratio: 4
Triglycerides: 95 mg/dL (ref 0.0–149.0)
VLDL: 19 mg/dL (ref 0.0–40.0)

## 2011-03-31 LAB — BASIC METABOLIC PANEL
BUN: 18 mg/dL (ref 6–23)
CO2: 29 mEq/L (ref 19–32)
Calcium: 8.9 mg/dL (ref 8.4–10.5)
Chloride: 101 mEq/L (ref 96–112)
Creatinine, Ser: 0.9 mg/dL (ref 0.4–1.5)
GFR: 83.18 mL/min (ref >60.00)
Glucose, Bld: 96 mg/dL (ref 70–99)
Potassium: 4.3 mEq/L (ref 3.5–5.1)

## 2011-03-31 LAB — POCT URINALYSIS DIPSTICK
Clarity, UA: NORMAL
Ketones, UA: NEGATIVE
Leukocytes, UA: NEGATIVE
Protein, UA: NEGATIVE

## 2011-03-31 LAB — URIC ACID: Uric Acid, Serum: 6.5 mg/dL (ref 4.0–7.8)

## 2011-03-31 LAB — HEMOGLOBIN A1C: Hgb A1c MFr Bld: 6.6 % — ABNORMAL HIGH (ref 4.6–6.5)

## 2011-03-31 NOTE — Assessment & Plan Note (Signed)
On diet control, labs 

## 2011-03-31 NOTE — Progress Notes (Signed)
  Subjective:    Patient ID: [name], male    DOB: 1919-05-30, 77 y.o.   MRN: [id number]  HPI ROV Was seen w/ cough recently, improved  DM-- on no meds , no amb CBGs Gout, run out of allopurinol several months ago, asymptomatic High cholesterol, ran out of medicine several months ago BP noted to be slightly low, I recheck the blood pressure myself and obtained 100/ 80. See review of systems  Past Medical History  Diagnosis Date  . Diabetes mellitus     dx 3-11, A1C 6.7  . Hyperlipidemia   . Allergy     rhinitis  . Hiatal hernia   . Trigeminal neuralgia     h/o , status post gamma knife procedure and trial with multiple drugs  . Gout   . Skin cancer   . Prostate ca 2002    s/p  x-ray therapy and surgery   Past Surgical History  Procedure Date  . Cholecystectomy    History   Social History  . Marital Status: Married    Spouse Name: N/A    Number of Children: 2  . Years of Education: N/A   Occupational History  . Poet    Social History Main Topics  . Smoking status: Former Games developer  . Smokeless tobacco: Never Used  . Alcohol Use: Yes     rare  . Drug Use: No  . Sexually Active: Not on file   Other Topics Concern  . Not on file   Social History Narrative   From [place], Wife from [place]   Family History  Problem Relation Age of Onset  . Cancer Neg Hx     colon    Review of Systems Denies nausea, vomiting, diarrhea, no blood in the stools. Denies chest pain or lower extremity edema Complains of nocturia approximately 4 times at night, urinary flow is slow at night. No dysuria gross hematuria.    Objective:   Physical Exam  Constitutional: He is oriented to person, place, and time. He appears well-developed and well-nourished.  Cardiovascular: Normal rate, regular rhythm and normal heart sounds.   No murmur heard. Pulmonary/Chest: Effort normal. No respiratory distress. He has no rales.       Very few end expiratory wheezes, less than before. No  respiratory distress  Abdominal: Soft. He exhibits no distension. There is no tenderness. There is no rebound and no guarding.  Neurological: He is alert and oriented to person, place, and time.  Psychiatric: He has a normal mood and affect. His behavior is normal. Judgment and thought content normal.          Assessment & Plan:

## 2011-03-31 NOTE — Assessment & Plan Note (Addendum)
Last office visit weight urology 06-2010. PSA was better they recommended followup in 6 months. Dr. Vonita Moss Patient state he will see Dr. Vonita Moss. He does have some urinary symptoms, we'll check a UA and urine culture if needed

## 2011-03-31 NOTE — Assessment & Plan Note (Addendum)
Although we are not doing physical exam the chart is reviewed: Td 2009 pneumonia shot 2007 h/o  adenomatous polyps, last Cscope 2006  neg cardiolite 01-2004  Carotid u/s 10-2006: 0-39%  Plan: Flu shot today, no further colonoscopies given age

## 2011-03-31 NOTE — Assessment & Plan Note (Addendum)
Has taken medications on and off for years, history of poor compliance. Patient is 77 years old, will observe for now unless cholesterol is very high .

## 2011-03-31 NOTE — Assessment & Plan Note (Signed)
Not taking allopurinol for months, asymptomatic, recheck a uric acid

## 2011-04-04 LAB — CULTURE, URINE COMPREHENSIVE: Colony Count: 9000

## 2011-04-06 ENCOUNTER — Telehealth: Payer: Self-pay

## 2011-04-06 NOTE — Telephone Encounter (Signed)
Left message for pt to call back with name of pharmacy

## 2011-04-06 NOTE — Telephone Encounter (Signed)
Message copied by Beverely Low on Tue Apr 06, 2011 10:56 AM ------      Message from: Willow Ora E      Created: Mon Apr 05, 2011 10:48 AM       Advise patient:      Urine culture did show infection, start amoxicillin 500 mg 2 tablets twice a day for 2 weeks #56, no RF       Also, fax results to neurology and remind patient he needs to see them

## 2011-04-07 MED ORDER — AMOXICILLIN 500 MG PO CAPS: 500.0000 mg | ORAL_CAPSULE | Freq: Two times a day (BID) | ORAL | Status: AC

## 2011-04-07 NOTE — Telephone Encounter (Signed)
Pt's wife returned call with pharmacy name.   Rx sent to pharmacy

## 2011-06-30 ENCOUNTER — Other Ambulatory Visit: Payer: Self-pay | Admitting: Internal Medicine

## 2011-07-05 NOTE — Telephone Encounter (Signed)
rx sent to pharmacy by e-script  

## 2011-08-18 ENCOUNTER — Encounter: Payer: Self-pay | Admitting: Internal Medicine

## 2011-08-18 ENCOUNTER — Ambulatory Visit (INDEPENDENT_AMBULATORY_CARE_PROVIDER_SITE_OTHER): Payer: Medicare Other | Admitting: Internal Medicine

## 2011-08-18 VITALS — BP 122/68 | HR 82 | Temp 97.4°F | Wt 174.0 lb

## 2011-08-18 DIAGNOSIS — I739 Peripheral vascular disease, unspecified: Secondary | ICD-10-CM | POA: Diagnosis not present

## 2011-08-18 DIAGNOSIS — R05 Cough: Secondary | ICD-10-CM

## 2011-08-18 DIAGNOSIS — J9801 Acute bronchospasm: Secondary | ICD-10-CM | POA: Insufficient documentation

## 2011-08-18 MED ORDER — CLOTRIMAZOLE-BETAMETHASONE 1-0.05 % EX CREA: TOPICAL_CREAM | Freq: Two times a day (BID) | CUTANEOUS | Status: DC

## 2011-08-18 NOTE — Patient Instructions (Signed)
Get the XR done Rest, fluids , tylenol For cough, take Mucinex DM twice a day as needed  Symbicort twice a day x 1 month Take the antibiotic avelox once a day x 5 days  Call if no better in few days Call anytime if the symptoms are severe  Come back in 1 month for a check up

## 2011-08-18 NOTE — Assessment & Plan Note (Signed)
C/o bluish discoloration which is confirmed on exam, no claudication . Plan: ABIs to document the absence of significant PVD

## 2011-08-18 NOTE — Assessment & Plan Note (Signed)
6 weeks h/o cough-wheezing  No alarming sx such as fever-wt loss Plan: CXR avelox x 5 days (7 days if PNM) Samples of symbicort See instructions

## 2011-08-18 NOTE — Progress Notes (Signed)
  Subjective:    Patient ID: [name], male    DOB: [birth year], 78 y.o.   MRN: [id number]  HPI Acute visit Here with his wife 6 weeks history of cough, sputum production and wheezing. Initially took Mucinex with modest help. He continued with symptoms, symptoms are worse at night. Sputum is white, no hemoptysis. In addition, the patient's wife is concerned because his toes are "bluish" for a few months, she put them warm water with Epsom salts and that seems to help. No "bluish" color in the fingers   Past Medical History: DM-- dx 3-11, A1C 6.7 Gout Hyperlipidemia Hx of TRIGEMINAL NEURALGIA  , status post gamma knife procedure after a trial w/ several drugs ADENOCARCINOMA, PROSTATE---> 2002, s/p XRT- surgery (operative report not available) HIATAL HERNIA skin cancer neg cardiolite 01-2004 Carotid u/s 10-2006: 0-39%, next 1 year Allergic rhinitis  Past Surgical History: Cholecystectomy  Social history Married, 2 children, from [place] Occupation: poet Tobacco, quit in the 60s  Review of Systems  no fever or chills, no sinus congestion No nausea, vomiting, diarrhea No actual shortness of breath or chest pain. No claudication. Has gained 7 pounds since October 2010      Objective:   Physical Exam  Constitutional: He appears well-developed and well-nourished.  HENT:  Head: Normocephalic and atraumatic.  Right Ear: External ear normal.  Left Ear: External ear normal.  Nose: Nose normal.  Mouth/Throat: No oropharyngeal exudate.  Neck: No JVD present.  Cardiovascular: Normal rate and normal heart sounds.   No murmur heard.      Normal B femoral pulses, decreased pedal pulses  Pulmonary/Chest:       No increased WOB, ++ large airway congestion, ronchi, few end exp wheezing, no crackles   Musculoskeletal:       Trace edema B ankle-feet. Very mild bluish dyscoloration at the tip of the toes w/ normal cap refill       Assessment & Plan:

## 2011-08-19 ENCOUNTER — Ambulatory Visit (INDEPENDENT_AMBULATORY_CARE_PROVIDER_SITE_OTHER)
Admission: RE | Admit: 2011-08-19 | Discharge: 2011-08-19 | Disposition: A | Discharge status: Home / Self Care | Payer: Medicare Other | Source: Ambulatory Visit | Attending: Internal Medicine | Admitting: Internal Medicine

## 2011-08-19 DIAGNOSIS — R062 Wheezing: Secondary | ICD-10-CM | POA: Diagnosis not present

## 2011-08-19 DIAGNOSIS — R05 Cough: Secondary | ICD-10-CM

## 2011-08-19 DIAGNOSIS — J9819 Other pulmonary collapse: Secondary | ICD-10-CM | POA: Diagnosis not present

## 2011-08-24 ENCOUNTER — Telehealth: Payer: Self-pay

## 2011-08-24 NOTE — Telephone Encounter (Signed)
Patient and his spouse were notified of x-ray results.  Mrs. Trudell is concerned that patient is still coughing and congested.  He was only on 5 days of antibiotics, should he take another course of antibiotics?

## 2011-08-24 NOTE — Telephone Encounter (Signed)
Give 2 additional samples of avelox to complete 1 week of therapy, schedule a OV next week. If sx worsening needs to be seen later this week

## 2011-08-25 NOTE — Telephone Encounter (Signed)
[name], could you please call patient and give samples, if we have? Thanks

## 2011-08-25 NOTE — Telephone Encounter (Signed)
Discuss with patient wife 

## 2011-09-03 ENCOUNTER — Encounter (INDEPENDENT_AMBULATORY_CARE_PROVIDER_SITE_OTHER): Payer: Medicare Other

## 2011-09-03 DIAGNOSIS — I739 Peripheral vascular disease, unspecified: Secondary | ICD-10-CM | POA: Diagnosis not present

## 2011-09-06 ENCOUNTER — Ambulatory Visit (INDEPENDENT_AMBULATORY_CARE_PROVIDER_SITE_OTHER): Payer: Medicare Other | Admitting: Internal Medicine

## 2011-09-06 ENCOUNTER — Encounter: Payer: Self-pay | Admitting: Internal Medicine

## 2011-09-06 VITALS — BP 122/72 | HR 60 | Temp 98.1°F | Wt 174.0 lb

## 2011-09-06 DIAGNOSIS — J9801 Acute bronchospasm: Secondary | ICD-10-CM

## 2011-09-06 DIAGNOSIS — E119 Type 2 diabetes mellitus without complications: Secondary | ICD-10-CM | POA: Diagnosis not present

## 2011-09-06 DIAGNOSIS — I739 Peripheral vascular disease, unspecified: Secondary | ICD-10-CM

## 2011-09-06 DIAGNOSIS — D649 Anemia, unspecified: Secondary | ICD-10-CM | POA: Diagnosis not present

## 2011-09-06 DIAGNOSIS — E785 Hyperlipidemia, unspecified: Secondary | ICD-10-CM | POA: Diagnosis not present

## 2011-09-06 MED ORDER — BECLOMETHASONE DIPROPIONATE 80 MCG/ACT IN AERS: 1.0000 | INHALATION_SPRAY | Freq: Two times a day (BID) | RESPIRATORY_TRACT | Status: DC

## 2011-09-06 NOTE — Assessment & Plan Note (Addendum)
Status post Avelox, chest x-ray showed no acute problems. Patient is a former smoker. Plan: Instead of Symbicort will Rx Qvar PFTs

## 2011-09-06 NOTE — Assessment & Plan Note (Signed)
On diet control, labs 

## 2011-09-06 NOTE — Assessment & Plan Note (Signed)
History of mild anemia. The chart is reviewed, he had a colonoscopy in 2006 (adenomatous polyps)and GI recommended no further screening colonoscopies. Plan: labs

## 2011-09-06 NOTE — Patient Instructions (Signed)
Please come back fasting: A1c, microalbumin--- dx  diabetes CBC, iron, ferritin --- dx anemia FLP, AST ALT-- dx hyperlipidemia

## 2011-09-06 NOTE — Assessment & Plan Note (Addendum)
ABIs were done last week, report pending. Patient's wife concerned about edema, we discussed leg elevation & low-salt diet

## 2011-09-06 NOTE — Progress Notes (Signed)
  Subjective:    Patient ID: [name], male    DOB: 04-22-1919, 78 y.o.   MRN: [id number]  HPI Routine office visit, here with his wife. Diabetes, on diet control only, due for labs. High cholesterol, good compliance with medications. Cough, status post Avelox, cough has decreased significantly, still has some wheezing.  Past Medical History:  DM-- dx 3-11, A1C 6.7  Gout  Hyperlipidemia  Hx of TRIGEMINAL NEURALGIA , status post gamma knife procedure after a trial w/ several drugs  ADENOCARCINOMA, PROSTATE---> 2002, s/p XRT- surgery (operative report not available)  HIATAL HERNIA  skin cancer  neg cardiolite 01-2004  Carotid u/s 10-2006: 0-39%, next 1 year  Allergic rhinitis  Past Surgical History:  Cholecystectomy  Social history  Married, 2 children, from [place]  Occupation: poet  Tobacco, quit in the 60s  Review of Systems Nor nausea, vomiting or diarrhea. No chest pain or actual shortness of breath. He does have some lower extremity edema at the end of the day only.     Objective:   Physical Exam  General -- alert, well-developed, and well-nourished. NAD  Lungs -- normal respiratory effort, no intercostal retractions, no accessory muscle use; rhonchi bilaterally, few wheezes bilaterally Heart-- normal rate, regular rhythm, no murmur, and no gallop.   Abdomen--soft, non-tender, no distention, no masses, no HSM, no guarding, and no rigidity.   Extremities-- trace pretibial edema bilaterally        Assessment & Plan:

## 2011-09-06 NOTE — Assessment & Plan Note (Signed)
Good compliance w/ meds , labs  

## 2011-09-07 ENCOUNTER — Other Ambulatory Visit (INDEPENDENT_AMBULATORY_CARE_PROVIDER_SITE_OTHER): Payer: Medicare Other

## 2011-09-07 DIAGNOSIS — E119 Type 2 diabetes mellitus without complications: Secondary | ICD-10-CM

## 2011-09-07 DIAGNOSIS — E785 Hyperlipidemia, unspecified: Secondary | ICD-10-CM | POA: Diagnosis not present

## 2011-09-07 LAB — MICROALBUMIN / CREATININE URINE RATIO: Microalb Creat Ratio: 0.5 mg/g (ref 0.0–30.0)

## 2011-09-07 LAB — CBC WITH DIFFERENTIAL/PLATELET
Basophils Absolute: 0 10*3/uL (ref 0.0–0.1)
Basophils Relative: 0.4 % (ref 0.0–3.0)
HCT: 39.1 % (ref 39.0–52.0)
Hemoglobin: 13.1 g/dL (ref 13.0–17.0)
Lymphs Abs: 1.3 10*3/uL (ref 0.7–4.0)
Monocytes Relative: 10.5 % (ref 3.0–12.0)
Neutro Abs: 3.3 10*3/uL (ref 1.4–7.7)
RBC: 4.09 Mil/uL — ABNORMAL LOW (ref 4.22–5.81)
RDW: 14 % (ref 11.5–14.6)

## 2011-09-07 LAB — LIPID PANEL
Cholesterol: 162 mg/dL (ref 0–200)
LDL Cholesterol: 93 mg/dL (ref 0–99)
Total CHOL/HDL Ratio: 3
VLDL: 13.6 mg/dL (ref 0.0–40.0)

## 2011-09-07 LAB — AST: AST: 27 U/L (ref 0–37)

## 2011-09-07 LAB — ALT: ALT: 20 U/L (ref 0–53)

## 2011-09-13 ENCOUNTER — Encounter: Payer: Self-pay | Admitting: *Deleted

## 2011-09-21 ENCOUNTER — Encounter (INDEPENDENT_AMBULATORY_CARE_PROVIDER_SITE_OTHER): Payer: Medicare Other

## 2011-09-21 DIAGNOSIS — R0602 Shortness of breath: Secondary | ICD-10-CM

## 2011-09-21 DIAGNOSIS — J9801 Acute bronchospasm: Secondary | ICD-10-CM

## 2011-09-22 ENCOUNTER — Telehealth: Payer: Self-pay | Admitting: *Deleted

## 2011-09-22 NOTE — Telephone Encounter (Signed)
Received a phone call from [facility] & they were unable to get any useful data from the pt's PFTs. They did not charge him.

## 2011-09-23 NOTE — Telephone Encounter (Signed)
Unable to do PFT's.

## 2011-09-28 ENCOUNTER — Ambulatory Visit (INDEPENDENT_AMBULATORY_CARE_PROVIDER_SITE_OTHER): Payer: Medicare Other | Admitting: Internal Medicine

## 2011-09-28 ENCOUNTER — Encounter: Payer: Self-pay | Admitting: Internal Medicine

## 2011-09-28 VITALS — BP 122/75 | HR 66 | Temp 97.6°F | Ht 65.0 in | Wt 179.4 lb

## 2011-09-28 DIAGNOSIS — J9801 Acute bronchospasm: Secondary | ICD-10-CM | POA: Diagnosis not present

## 2011-09-28 DIAGNOSIS — R609 Edema, unspecified: Secondary | ICD-10-CM

## 2011-09-28 DIAGNOSIS — I739 Peripheral vascular disease, unspecified: Secondary | ICD-10-CM

## 2011-09-28 MED ORDER — BECLOMETHASONE DIPROPIONATE 80 MCG/ACT IN AERS: 1.0000 | INHALATION_SPRAY | Freq: Two times a day (BID) | RESPIRATORY_TRACT | Status: DC

## 2011-09-28 NOTE — Assessment & Plan Note (Addendum)
He does have some lower extremity edema, no orthopnea chest pain or shortness of breath. He has no JVD on  exam. I think is warranted to check his heart due to the edema and wheezing (cardiac asthma?). EKG today, sinus brady, similar to previous EKGs. I would like to order an echocardiogram however the patient is leaving the country in 2 days. We'll do when he  return to the office although he is visiting his native country --[place]-- and he may do a cardiac workup there Check a BMP and a TSH. May call pt's daughter Suhas Estis with results as  pt leaving town in 1-2 days [phone]

## 2011-09-28 NOTE — Assessment & Plan Note (Signed)
Definitely improved which Qvar. Unable to do PFTs do to poor effort. Plan: Continue with Qvar

## 2011-09-28 NOTE — Patient Instructions (Signed)
Please call us as soon as you come back from [place] and we will schedule echocardiogram, a heart test. Look  for medical help if you get more swelling in the ankles. Elevate the legs at least 45 minutes twice a day and eat very little salt.

## 2011-09-28 NOTE — Assessment & Plan Note (Signed)
ABIs normal ; he does have a very mild discoloration of the toes, he could have problems with micro-circulation.

## 2011-09-28 NOTE — Progress Notes (Signed)
  Subjective:    Patient ID: [name], male    DOB: 1919/02/08, 78 y.o.   MRN: [id number]  HPI Here for a followup The patient's wife continued to be concerned about the swelling in the lower extremities, worse on the right. She is also still concerned about the bluish color of the toes. As far as the wheezing, he used Qvar, cough and wheezing have decreased. Has occasionally a small amount of sputum. He ran out of Qvar 2 days ago.  Past Medical History:  DM-- dx 3-11, A1C 6.7  Gout  Hyperlipidemia  Hx of TRIGEMINAL NEURALGIA , status post gamma knife procedure after a trial w/ several drugs  ADENOCARCINOMA, PROSTATE---> 2002, s/p XRT- surgery (operative report not available)  HIATAL HERNIA  skin cancer  neg cardiolite 01-2004  Carotid u/s 10-2006: 0-39%, next 1 year  Allergic rhinitis  Past Surgical History:  Cholecystectomy  Social history  Married, 2 children, from [place]  Occupation: poet  Tobacco, quit in the 60s   Review of Systems No orthopnea, chest pain or dyspnea on exertion. Able to do all his activities of daily living without problems.    Objective:   Physical Exam  General -- alert, well-developed, and well-nourished. NAD  Neck --no JVD Lungs -- normal respiratory effort, no intercostal retractions, no accessory muscle use; a few end expiratory wheezing and scattered rhonchi, but definitely improved compared to last visit. Heart-- normal rate, regular rhythm, no murmur, and no gallop.   Extremities-- trace pretibial edema bilaterally      Assessment & Plan:

## 2011-09-29 LAB — BASIC METABOLIC PANEL
CO2: 28 mEq/L (ref 19–32)
Calcium: 8.6 mg/dL (ref 8.4–10.5)
Creatinine, Ser: 0.9 mg/dL (ref 0.4–1.5)
GFR: 86.36 mL/min (ref >60.00)
Sodium: 141 mEq/L (ref 135–145)

## 2011-10-04 ENCOUNTER — Encounter: Payer: Self-pay | Admitting: *Deleted

## 2012-03-15 ENCOUNTER — Ambulatory Visit (INDEPENDENT_AMBULATORY_CARE_PROVIDER_SITE_OTHER): Payer: Medicare Other

## 2012-03-15 DIAGNOSIS — Z23 Encounter for immunization: Secondary | ICD-10-CM

## 2012-05-09 ENCOUNTER — Other Ambulatory Visit: Payer: Self-pay | Admitting: Internal Medicine

## 2012-05-09 NOTE — Telephone Encounter (Signed)
Refill done.  

## 2012-05-16 ENCOUNTER — Encounter: Payer: Self-pay | Admitting: Internal Medicine

## 2012-05-16 ENCOUNTER — Ambulatory Visit (INDEPENDENT_AMBULATORY_CARE_PROVIDER_SITE_OTHER): Payer: Medicare Other | Admitting: Internal Medicine

## 2012-05-16 VITALS — BP 134/80 | HR 55 | Temp 97.7°F | Ht 64.75 in | Wt 181.0 lb

## 2012-05-16 DIAGNOSIS — E785 Hyperlipidemia, unspecified: Secondary | ICD-10-CM | POA: Diagnosis not present

## 2012-05-16 DIAGNOSIS — R609 Edema, unspecified: Secondary | ICD-10-CM

## 2012-05-16 DIAGNOSIS — Z23 Encounter for immunization: Secondary | ICD-10-CM

## 2012-05-16 DIAGNOSIS — J9801 Acute bronchospasm: Secondary | ICD-10-CM

## 2012-05-16 DIAGNOSIS — Z0000 Encounter for general adult medical examination without abnormal findings: Secondary | ICD-10-CM

## 2012-05-16 DIAGNOSIS — E119 Type 2 diabetes mellitus without complications: Secondary | ICD-10-CM

## 2012-05-16 DIAGNOSIS — M109 Gout, unspecified: Secondary | ICD-10-CM

## 2012-05-16 DIAGNOSIS — Z8546 Personal history of malignant neoplasm of prostate: Secondary | ICD-10-CM | POA: Diagnosis not present

## 2012-05-16 LAB — MICROALBUMIN / CREATININE URINE RATIO
Microalb Creat Ratio: 0.5 mg/g (ref 0.0–30.0)
Microalb, Ur: 0.8 mg/dL (ref 0.0–1.9)

## 2012-05-16 LAB — LIPID PANEL: VLDL: 18 mg/dL (ref 0.0–40.0)

## 2012-05-16 LAB — AST: AST: 26 U/L (ref 0–37)

## 2012-05-16 LAB — HEMOGLOBIN A1C: Hgb A1c MFr Bld: 6.5 % (ref 4.6–6.5)

## 2012-05-16 LAB — PSA: PSA: 1.81 ng/mL (ref 0.10–4.00)

## 2012-05-16 NOTE — Assessment & Plan Note (Signed)
Occasional urinary symptoms, check a UA, check a PSA, urology referral

## 2012-05-16 NOTE — Assessment & Plan Note (Signed)
Definitely better compared to previous months, and not taking any medication at this point.

## 2012-05-16 NOTE — Assessment & Plan Note (Signed)
Stable at this point, no respiratory or cardiac symptoms. Plan: Echocardiogram, see previous entry

## 2012-05-16 NOTE — Patient Instructions (Signed)

## 2012-05-16 NOTE — Assessment & Plan Note (Addendum)
Due for labs

## 2012-05-16 NOTE — Progress Notes (Signed)
  Subjective:    Patient ID: [name], male    DOB: 30-Jan-1919, 78 y.o.   MRN: [id number]  HPI Routine office visit, here by himself The only medication he is taking is a cholesterol medication. History of edema which is about the same, still have some. History of prostate cancer, complains of nocturia, 3 or 4 times a night. He has on and off urinary symptoms for a while.  Past Medical History:   DM-- dx 3-11, A1C 6.7   Gout   Hyperlipidemia   Hx of TRIGEMINAL NEURALGIA , status post gamma knife procedure after a trial w/ several drugs   ADENOCARCINOMA, PROSTATE---> 2002, s/p XRT- surgery (operative report not available)   HIATAL HERNIA   skin cancer   neg cardiolite 01-2004   Carotid u/s 10-2006: 0-39%, next 1 year   Allergic rhinitis    Past Surgical History:   Cholecystectomy    Social history   Married, 2 children, from [place]   Occupation: poet   Tobacco, quit in the 60s   Review of Systems Denies chest pain or shortness of breath.  At some point he had a wheezing, no further respiratory complaints. Denies dysuria or gross hematuria. Occasionally has constipation, denies abdominal pain, nausea, vomiting, blood in the stools. Usually prune juice  helps.     Objective:   Physical Exam General -- alert, well-developed    Neck --no JVD at 45 Lungs -- normal respiratory effort, no intercostal retractions, no accessory muscle use, and normal breath sounds.   Heart-- normal rate, regular rhythm, no murmur, and no gallop.   Abdomen--soft, non-tender, no distention, no masses  Extremities-- trace  pretibial edema bilaterally Neurologic-- alert & oriented to self, space and time . Psych-- Cognition and judgment appear intact. Alert and cooperative with normal attention span and concentration.  not anxious appearing and not depressed appearing.       Assessment & Plan:

## 2012-05-16 NOTE — Assessment & Plan Note (Signed)
Good ompliance, we'll monitor labs

## 2012-05-16 NOTE — Assessment & Plan Note (Signed)
last uric acid normal despite not taking any medication. Asymptomatic.

## 2012-05-16 NOTE — Assessment & Plan Note (Addendum)
Although we are not doing physical exam the chart is reviewed: Td 2009 pneumonia shot 2007 and today Had a flu shot h/o  adenomatous polyps, last Cscope 2006 ----> no further colonoscopies given age  Fall prevention discussed

## 2012-05-19 ENCOUNTER — Encounter: Payer: Self-pay | Admitting: *Deleted

## 2012-05-23 ENCOUNTER — Ambulatory Visit (HOSPITAL_COMMUNITY): Payer: Medicare Other | Attending: Cardiology | Admitting: Radiology

## 2012-05-23 DIAGNOSIS — R609 Edema, unspecified: Secondary | ICD-10-CM

## 2012-05-23 DIAGNOSIS — I10 Essential (primary) hypertension: Secondary | ICD-10-CM | POA: Insufficient documentation

## 2012-05-23 DIAGNOSIS — I517 Cardiomegaly: Secondary | ICD-10-CM | POA: Diagnosis not present

## 2012-05-23 DIAGNOSIS — E119 Type 2 diabetes mellitus without complications: Secondary | ICD-10-CM | POA: Diagnosis not present

## 2012-05-23 DIAGNOSIS — I359 Nonrheumatic aortic valve disorder, unspecified: Secondary | ICD-10-CM | POA: Insufficient documentation

## 2012-05-23 DIAGNOSIS — I079 Rheumatic tricuspid valve disease, unspecified: Secondary | ICD-10-CM | POA: Insufficient documentation

## 2012-05-23 DIAGNOSIS — E785 Hyperlipidemia, unspecified: Secondary | ICD-10-CM | POA: Insufficient documentation

## 2012-05-23 NOTE — Progress Notes (Signed)
Echocardiogram performed.  

## 2012-05-26 DIAGNOSIS — Z8546 Personal history of malignant neoplasm of prostate: Secondary | ICD-10-CM | POA: Diagnosis not present

## 2012-06-20 ENCOUNTER — Other Ambulatory Visit: Payer: Self-pay | Admitting: Internal Medicine

## 2012-06-20 NOTE — Telephone Encounter (Signed)
Refill done.  

## 2013-03-19 ENCOUNTER — Encounter: Payer: Self-pay | Admitting: Internal Medicine

## 2013-03-19 ENCOUNTER — Ambulatory Visit (INDEPENDENT_AMBULATORY_CARE_PROVIDER_SITE_OTHER): Payer: Medicare Other | Admitting: Internal Medicine

## 2013-03-19 VITALS — BP 160/87 | HR 76 | Temp 97.9°F | Wt 182.4 lb

## 2013-03-19 DIAGNOSIS — E785 Hyperlipidemia, unspecified: Secondary | ICD-10-CM

## 2013-03-19 DIAGNOSIS — Z0000 Encounter for general adult medical examination without abnormal findings: Secondary | ICD-10-CM

## 2013-03-19 DIAGNOSIS — Z23 Encounter for immunization: Secondary | ICD-10-CM | POA: Diagnosis not present

## 2013-03-19 DIAGNOSIS — E119 Type 2 diabetes mellitus without complications: Secondary | ICD-10-CM | POA: Diagnosis not present

## 2013-03-19 DIAGNOSIS — Z8546 Personal history of malignant neoplasm of prostate: Secondary | ICD-10-CM | POA: Diagnosis not present

## 2013-03-19 DIAGNOSIS — R609 Edema, unspecified: Secondary | ICD-10-CM

## 2013-03-19 LAB — CBC WITH DIFFERENTIAL/PLATELET
Basophils Relative: 0.5 % (ref 0.0–3.0)
Eosinophils Absolute: 0.2 10*3/uL (ref 0.0–0.7)
HCT: 39.2 % (ref 39.0–52.0)
Hemoglobin: 13.2 g/dL (ref 13.0–17.0)
Lymphocytes Relative: 24.3 % (ref 12.0–46.0)
Lymphs Abs: 1.2 10*3/uL (ref 0.7–4.0)
MCHC: 33.7 g/dL (ref 30.0–36.0)
MCV: 95.3 fl (ref 78.0–100.0)
Monocytes Absolute: 0.5 10*3/uL (ref 0.1–1.0)
Neutro Abs: 3.1 10*3/uL (ref 1.4–7.7)
RBC: 4.11 Mil/uL — ABNORMAL LOW (ref 4.22–5.81)

## 2013-03-19 LAB — HEMOGLOBIN A1C: Hgb A1c MFr Bld: 6.6 % — ABNORMAL HIGH (ref 4.6–6.5)

## 2013-03-19 LAB — LIPID PANEL
Cholesterol: 173 mg/dL (ref 0–200)
HDL: 56.7 mg/dL (ref >39.00)
Total CHOL/HDL Ratio: 3

## 2013-03-19 LAB — BASIC METABOLIC PANEL
CO2: 30 mEq/L (ref 19–32)
Calcium: 8.6 mg/dL (ref 8.4–10.5)
Chloride: 104 mEq/L (ref 96–112)
Creatinine, Ser: 1 mg/dL (ref 0.4–1.5)
Glucose, Bld: 99 mg/dL (ref 70–99)
Potassium: 4.2 mEq/L (ref 3.5–5.1)
Sodium: 138 mEq/L (ref 135–145)

## 2013-03-19 NOTE — Assessment & Plan Note (Signed)
Not on issue at this point, echocardiogram 05-2012 was okay

## 2013-03-19 NOTE — Progress Notes (Signed)
  Subjective:    Patient ID: [name], male    DOB: 24-Dec-1918, 79 y.o.   MRN: [id number]  HPI Here for a checkup History of prostate cancer, when wife schedule the appointment she reported some problems, today the patient reports nocturia "depending on how much fluids i have before bedtime". History of diabetes, diet has improved, trying to control portion sizes . No ambulatory blood sugars. BP today slightly elevated, patient is asymptomatic, previous BPs normal.  Past Medical History:   DM-- dx 3-11, A1C 6.7   Gout   Hyperlipidemia   Hx of TRIGEMINAL NEURALGIA , status post gamma knife procedure after a trial w/ several drugs   ADENOCARCINOMA, PROSTATE---> 2002, s/p XRT- surgery (operative report not available)   HIATAL HERNIA   skin cancer   neg cardiolite 01-2004   Carotid u/s 10-2006: 0-39%, next 1 year   Allergic rhinitis    Past Surgical History:   Cholecystectomy  Prostate surgery  Social history   Married, 2 children (daughter in [place], son in [place]), from [place]   Occupation: poet   Tobacco, quit in the 40s ETOH-- rare  Review of Systems  No  CP, SOB, lower extremity edema Denies  nausea, vomiting diarrhea Denies  blood in the stools occ post nasal dripping (-) cough, sputum production  No dysuria, gross hematuria, difficulty urinating        Objective:   Physical Exam BP 160/87  Pulse 76  Temp(Src) 97.9 F (36.6 C)  Wt 182 lb 6.4 oz (82.736 kg)  BMI 30.57 kg/m2  SpO2 98%  General -- alert, frail-elderly man with gait appropriate for age   Lungs -- normal respiratory effort, no intercostal retractions, no accessory muscle use; No wheezing or rhonchi, few dry crackles at bases?  Heart-- normal rate, regular rhythm, no murmur.  Abdomen-- Not distended, good bowel sounds,soft, non-tender.  Extremities-- trace peri-ankle edema bilaterally  Neurologic--  alert & oriented X3. Speech normal  Psych-- slow to answer but cognition and judgment appear  intact. Cooperative with normal attention span and concentration. No anxious appearing , no depressed appearing.      Assessment & Plan:

## 2013-03-19 NOTE — Assessment & Plan Note (Signed)
Occasional nocturia otherwise feeling well. Last PSA 1.81December 2013. Last visit with urology December 2013, was recommended to return to the office in one year. Plan: Avoid excessive fluids at night

## 2013-03-19 NOTE — Assessment & Plan Note (Signed)
Although we are not doing physical exam the chart is reviewed: Td 2009 pneumonia shot 2007 and 2013 Flu shot today h/o  adenomatous polyps, last Cscope 2006 ----> no further colonoscopies given age

## 2013-03-19 NOTE — Assessment & Plan Note (Signed)
Good compliance with medication, check FLP, AST ALT

## 2013-03-19 NOTE — Assessment & Plan Note (Signed)
On diet control, labs 

## 2013-03-21 ENCOUNTER — Encounter: Payer: Self-pay | Admitting: *Deleted

## 2013-04-23 ENCOUNTER — Other Ambulatory Visit: Payer: Self-pay | Admitting: Internal Medicine

## 2013-04-23 NOTE — Telephone Encounter (Signed)
Simvastatin refill sent to pharmacy 

## 2013-06-26 DIAGNOSIS — Z8546 Personal history of malignant neoplasm of prostate: Secondary | ICD-10-CM | POA: Diagnosis not present

## 2014-02-19 ENCOUNTER — Other Ambulatory Visit: Payer: Self-pay | Admitting: Internal Medicine

## 2014-02-22 ENCOUNTER — Encounter: Payer: Self-pay | Admitting: Internal Medicine

## 2014-02-22 ENCOUNTER — Ambulatory Visit (INDEPENDENT_AMBULATORY_CARE_PROVIDER_SITE_OTHER): Payer: Medicare Other | Admitting: Internal Medicine

## 2014-02-22 VITALS — BP 110/62 | HR 67 | Temp 98.1°F | Wt 181.0 lb

## 2014-02-22 DIAGNOSIS — R269 Unspecified abnormalities of gait and mobility: Secondary | ICD-10-CM

## 2014-02-22 DIAGNOSIS — E119 Type 2 diabetes mellitus without complications: Secondary | ICD-10-CM

## 2014-02-22 DIAGNOSIS — R21 Rash and other nonspecific skin eruption: Secondary | ICD-10-CM

## 2014-02-22 DIAGNOSIS — S7001XA Contusion of right hip, initial encounter: Secondary | ICD-10-CM

## 2014-02-22 DIAGNOSIS — J309 Allergic rhinitis, unspecified: Secondary | ICD-10-CM

## 2014-02-22 DIAGNOSIS — Z23 Encounter for immunization: Secondary | ICD-10-CM

## 2014-02-22 DIAGNOSIS — Z0000 Encounter for general adult medical examination without abnormal findings: Secondary | ICD-10-CM

## 2014-02-22 DIAGNOSIS — E785 Hyperlipidemia, unspecified: Secondary | ICD-10-CM

## 2014-02-22 LAB — BASIC METABOLIC PANEL
BUN: 16 mg/dL (ref 6–23)
CHLORIDE: 104 meq/L (ref 96–112)
CO2: 28 mEq/L (ref 19–32)
CREATININE: 0.9 mg/dL (ref 0.4–1.5)
Calcium: 8.6 mg/dL (ref 8.4–10.5)
GFR: 82.64 mL/min (ref >60.00)
Glucose, Bld: 96 mg/dL (ref 70–99)
POTASSIUM: 4 meq/L (ref 3.5–5.1)
Sodium: 138 mEq/L (ref 135–145)

## 2014-02-22 LAB — HEMOGLOBIN A1C: HEMOGLOBIN A1C: 6.7 % — AB (ref 4.6–6.5)

## 2014-02-22 MED ORDER — ZOSTER VACCINE LIVE 19400 UNT/0.65ML ~~LOC~~ SOLR: 0.6500 mL | Freq: Once | SUBCUTANEOUS | Status: DC

## 2014-02-22 NOTE — Assessment & Plan Note (Signed)
On diet control, will check the A1c and BMP

## 2014-02-22 NOTE — Progress Notes (Signed)
Subjective:    Patient ID: [name], male    DOB: [birth year], 80 y.o.   MRN: [id number]  DOS:  02/22/2014 Type of visit - description : acute, several issues  Interval history: 4 months ago, was taking a shower and fell, landed on his right hip, also injured his head. No loss of consciousness. He was able to immediately walk after the incident, he noted a lot of swelling at the right hip and  moderate pain. Few weeks later he went to [place], while there he saw a doctor who apparently drained some fluid from the hip w/ a needle. Since then the swelling has decreased but is not completely gone, the pain is very little.  high cholesterol, good medication compliance Continue with nocturia, no worse or better. Complain of constantly "clearing her throat from phlegm"  ROS Denies fever, sinus pain or congestion. No GERD symptoms No cough or wheezing No headache or dizziness   Past Medical History  Diagnosis Date  . Diabetes mellitus     dx 3-11, A1C 6.7  . Hyperlipidemia   . Allergy     rhinitis  . Hiatal hernia   . Trigeminal neuralgia     h/o , status post gamma knife procedure and trial with multiple drugs  . Gout   . Skin cancer   . Prostate ca 2002    s/p  x-ray therapy and surgery    Past Surgical History  Procedure Laterality Date  . Cholecystectomy      History   Social History  . Marital Status: Married    Spouse Name: N/A    Number of Children: 2  . Years of Education: N/A   Occupational History  . Poet    Social History Main Topics  . Smoking status: Former Research scientist (life sciences)  . Smokeless tobacco: Never Used  . Alcohol Use: No     Comment: rare  . Drug Use: No  . Sexual Activity: Not on file   Other Topics Concern  . Not on file   Social History Narrative   From [place], Wife from [place]                 Medication List       This list is accurate as of: 02/22/14 11:59 PM.  Always use your most recent med list.               clotrimazole-betamethasone cream  Commonly known as:  LOTRISONE  Apply topically 2 (two) times daily.     simvastatin 40 MG tablet  Commonly known as:  ZOCOR  Take 1 tablet at bedtime. Please call office and make appt for any further refills. [phone].     zoster vaccine live (PF) 19400 UNT/0.65ML injection  Commonly known as:  ZOSTAVAX  Inject 19,400 Units into the skin once.           Objective:   Physical Exam  Musculoskeletal:       Legs:  BP 110/62  Pulse 67  Temp(Src) 98.1 F (36.7 C) (Oral)  Wt 181 lb (82.101 kg)  SpO2 94%  General -- alert, well-developed, NAD.    Lungs -- normal respiratory effort, no intercostal retractions, no accessory muscle use, and very few ronchi, no wheezing Heart-- normal rate, regular rhythm, no murmur.   Extremities-- trace pretibial edema bilaterally . Hips: rotation symmetrically decreased without pain. Neurologic--  alert & oriented X3. Speech normal, gait slow, slt unsteady, strength symmetric and appropriate for age.  Psych-- Cognition and judgment appear intact. Cooperative with normal attention span and concentration. No anxious or depressed appearing.       Assessment & Plan:    Hip contusion, On exam, hip rotation is symmetric, apparently he had a hematoma which is slowly resolving. The incident happened 4 months ago, I don't think there is a need for an x-ray, rec  observation.

## 2014-02-22 NOTE — Patient Instructions (Signed)
Get your blood work before you leave    Please come back to the office in 3 to 4 months  for a   physical exam. Come back fasting    Stop by the front desk and schedule the visit

## 2014-02-22 NOTE — Assessment & Plan Note (Signed)
Flu shot provided today Prescription for Zostavax also provided, explained the benefits

## 2014-02-22 NOTE — Assessment & Plan Note (Signed)
Good compliance of medication, recommend to return to the office fasting in 3 months

## 2014-02-22 NOTE — Progress Notes (Signed)
Pre visit review using our clinic review tool, if applicable. No additional management support is needed unless otherwise documented below in the visit note. 

## 2014-02-22 NOTE — Assessment & Plan Note (Signed)
occ inguinal rash, on lotrisone as needed

## 2014-02-24 DIAGNOSIS — R269 Unspecified abnormalities of gait and mobility: Secondary | ICD-10-CM | POA: Insufficient documentation

## 2014-02-24 NOTE — Assessment & Plan Note (Signed)
"  Clearing throat" Trial with flonase, see instructions

## 2014-02-24 NOTE — Assessment & Plan Note (Signed)
Gait disorder, Last fall was 4 months ago but he felt twice previously. Recommend physical therapy which he agrees Strongly declined using a cane Risks of further falls discussed

## 2014-03-22 ENCOUNTER — Other Ambulatory Visit: Payer: Self-pay | Admitting: Internal Medicine

## 2014-05-27 DIAGNOSIS — R600 Localized edema: Secondary | ICD-10-CM | POA: Diagnosis not present

## 2014-05-27 DIAGNOSIS — Z23 Encounter for immunization: Secondary | ICD-10-CM | POA: Diagnosis not present

## 2014-05-27 DIAGNOSIS — E785 Hyperlipidemia, unspecified: Secondary | ICD-10-CM | POA: Diagnosis not present

## 2014-05-27 DIAGNOSIS — R490 Dysphonia: Secondary | ICD-10-CM | POA: Diagnosis not present

## 2014-05-27 DIAGNOSIS — M545 Low back pain: Secondary | ICD-10-CM | POA: Diagnosis not present

## 2014-05-30 ENCOUNTER — Encounter: Payer: Medicare Other | Admitting: Internal Medicine

## 2015-01-27 DIAGNOSIS — R600 Localized edema: Secondary | ICD-10-CM | POA: Diagnosis not present

## 2015-01-27 DIAGNOSIS — E785 Hyperlipidemia, unspecified: Secondary | ICD-10-CM | POA: Diagnosis not present

## 2015-01-27 DIAGNOSIS — K117 Disturbances of salivary secretion: Secondary | ICD-10-CM | POA: Diagnosis not present

## 2015-01-28 DIAGNOSIS — R600 Localized edema: Secondary | ICD-10-CM | POA: Diagnosis not present

## 2015-01-28 DIAGNOSIS — E785 Hyperlipidemia, unspecified: Secondary | ICD-10-CM | POA: Diagnosis not present

## 2015-02-19 ENCOUNTER — Other Ambulatory Visit: Payer: Self-pay | Admitting: Internal Medicine

## 2015-03-12 DIAGNOSIS — Z23 Encounter for immunization: Secondary | ICD-10-CM | POA: Diagnosis not present

## 2015-03-12 DIAGNOSIS — R600 Localized edema: Secondary | ICD-10-CM | POA: Diagnosis not present

## 2015-03-12 DIAGNOSIS — M25551 Pain in right hip: Secondary | ICD-10-CM | POA: Diagnosis not present

## 2015-03-12 DIAGNOSIS — E785 Hyperlipidemia, unspecified: Secondary | ICD-10-CM | POA: Diagnosis not present

## 2015-03-12 DIAGNOSIS — R102 Pelvic and perineal pain: Secondary | ICD-10-CM | POA: Diagnosis not present

## 2015-03-12 DIAGNOSIS — R351 Nocturia: Secondary | ICD-10-CM | POA: Diagnosis not present

## 2015-03-12 DIAGNOSIS — K117 Disturbances of salivary secretion: Secondary | ICD-10-CM | POA: Diagnosis not present

## 2015-03-12 DIAGNOSIS — M25552 Pain in left hip: Secondary | ICD-10-CM | POA: Diagnosis not present

## 2015-03-28 DIAGNOSIS — C61 Malignant neoplasm of prostate: Secondary | ICD-10-CM | POA: Diagnosis not present

## 2015-03-28 DIAGNOSIS — R351 Nocturia: Secondary | ICD-10-CM | POA: Diagnosis not present

## 2015-05-26 DIAGNOSIS — Z23 Encounter for immunization: Secondary | ICD-10-CM | POA: Diagnosis not present

## 2015-06-06 ENCOUNTER — Telehealth: Payer: Self-pay

## 2015-06-06 NOTE — Telephone Encounter (Signed)
Left Msg for patient to call back to scheduled AWV or CPE

## 2015-06-27 DIAGNOSIS — C61 Malignant neoplasm of prostate: Secondary | ICD-10-CM | POA: Diagnosis not present

## 2015-06-27 DIAGNOSIS — R351 Nocturia: Secondary | ICD-10-CM | POA: Diagnosis not present

## 2015-10-06 DIAGNOSIS — R05 Cough: Secondary | ICD-10-CM | POA: Diagnosis not present

## 2015-10-06 DIAGNOSIS — K117 Disturbances of salivary secretion: Secondary | ICD-10-CM | POA: Diagnosis not present

## 2015-10-06 DIAGNOSIS — R292 Abnormal reflex: Secondary | ICD-10-CM | POA: Diagnosis not present

## 2015-10-06 DIAGNOSIS — I878 Other specified disorders of veins: Secondary | ICD-10-CM | POA: Diagnosis not present

## 2015-10-06 DIAGNOSIS — R600 Localized edema: Secondary | ICD-10-CM | POA: Diagnosis not present

## 2015-10-23 ENCOUNTER — Ambulatory Visit (INDEPENDENT_AMBULATORY_CARE_PROVIDER_SITE_OTHER): Payer: Medicare Other | Admitting: Neurology

## 2015-10-23 ENCOUNTER — Encounter: Payer: Self-pay | Admitting: Neurology

## 2015-10-23 VITALS — BP 132/70 | HR 66 | Ht 64.0 in | Wt 171.0 lb

## 2015-10-23 DIAGNOSIS — G20 Parkinson's disease: Secondary | ICD-10-CM

## 2015-10-23 DIAGNOSIS — G20A1 Parkinson's disease without dyskinesia, without mention of fluctuations: Secondary | ICD-10-CM

## 2015-10-23 DIAGNOSIS — R269 Unspecified abnormalities of gait and mobility: Secondary | ICD-10-CM

## 2015-10-23 HISTORY — DX: Parkinson's disease: G20

## 2015-10-23 HISTORY — DX: Parkinson's disease without dyskinesia, without mention of fluctuations: G20.A1

## 2015-10-23 MED ORDER — CARBIDOPA-LEVODOPA ER 25-100 MG PO TBCR: EXTENDED_RELEASE_TABLET | ORAL | Status: DC

## 2015-10-23 NOTE — Progress Notes (Signed)
Reason for visit: Parkinson's disease  Referring physician: Dr. Terrilyn Saver is an 82 y.o. male  History of present illness:  [name] is an 82 year old right-handed Hispanic male with a history of some problems with drooling over the last 2 years. He comes in today with his wife who is helping to interpret for him. The patient has not described any problems swallowing, but he does drool frequently which is socially embarrassing for him. Over the last 6-7 months, he has had some issues with walking. The patient has had slowness with walking, and some difficulty getting up out of a chair. The patient is more stooped. Occasionally he will have issues with shuffling his feet, and with his head getting before his feet, with almost a running gait. The patient has had changes in his handwriting with smaller more sloppy handwriting. The patient denies any tremors. He denies a weakness, or changes in bowel or bladder function. He is having some changes in memory. He is still remaining quite active, walking on a regular basis. He is sent to this office for an evaluation.  Past Medical History  Diagnosis Date  . Diabetes mellitus     dx 3-11, A1C 6.7  . Hyperlipidemia   . Allergy     rhinitis  . Hiatal hernia   . Trigeminal neuralgia     h/o , status post gamma knife procedure and trial with multiple drugs  . Gout   . Skin cancer   . Prostate CA [facility]) 2002    s/p  x-ray therapy and surgery  . Parkinson disease ([place]) 10/23/2015    Past Surgical History  Procedure Laterality Date  . Cholecystectomy      Family History  Problem Relation Age of Onset  . Cancer Neg Hx     colon  . Heart failure Mother   . Neuropathy Sister   . Diabetes Sister   . Kidney failure Sister   . Heart disease Brother   . Heart disease Sister   . Heart disease Brother   . Heart disease Brother     Social history:  reports that he has quit smoking. He has never used smokeless tobacco. He reports that  he does not drink alcohol or use illicit drugs.  Medications:  Prior to Admission medications   Medication Sig Start Date End Date Taking? Authorizing Provider  Ascorbic Acid (VITAMIN C) 1000 MG tablet Take 1,000 mg by mouth daily.   Yes Historical Provider, MD  clotrimazole-betamethasone (LOTRISONE) cream apply to affected area twice a day topically 03/25/14  Yes Colon Branch, MD  Multiple Vitamin (MULTIVITAMIN) tablet Take by mouth.   Yes Historical Provider, MD  simvastatin (ZOCOR) 40 MG tablet Take 1 tablet (40 mg total) by mouth at bedtime. 02/20/15  Yes Colon Branch, MD  Carbidopa-Levodopa ER (SINEMET CR) 25-100 MG tablet controlled release 1/2 tablet twice a day for 3 weeks, then take 1/2 tablet three times a day 10/23/15   Kathrynn Ducking, MD     No Known Allergies  ROS:  Out of a complete 14 system review of symptoms, the patient complains only of the following symptoms, and all other reviewed systems are negative.  Fatigue Swelling in the legs Hearing loss Loss of vision Snoring, cough Achy muscles Memory disturbance Sleepiness  Blood pressure 132/70, pulse 66, height 5\' 4"  (1.626 m), weight 171 lb (77.565 kg).  Physical Exam  General: The patient is alert and cooperative at the time of  the examination.  Eyes: Pupils are equal, round, and reactive to light. Discs are flat bilaterally.  Neck: The neck is supple, no carotid bruits are noted.  Respiratory: The respiratory examination is clear.  Cardiovascular: The cardiovascular examination reveals a regular rate and rhythm, no obvious murmurs or rubs are noted.  Skin: Extremities are with 2+ edema of ankles bilaterally.  Neurologic Exam  Mental status: The patient is alert and oriented x 3 at the time of the examination. The patient has apparent normal recent and remote memory, with an apparently normal attention span and concentration ability.  Cranial nerves: Facial symmetry is present. There is good sensation of  the face to pinprick and soft touch bilaterally. The strength of the facial muscles and the muscles to head turning and shoulder shrug are normal bilaterally. Speech is well enunciated, no aphasia or dysarthria is noted. Extraocular movements are full. Visual fields are full. The tongue is midline, and the patient has symmetric elevation of the soft palate. No obvious hearing deficits are noted. Masking the face is seen.  Motor: The motor testing reveals 5 over 5 strength of all 4 extremities. Good symmetric motor tone is noted throughout.  Sensory: Sensory testing is intact to pinprick, soft touch, vibration sensation, and position sense on all 4 extremities. No evidence of extinction is noted.  Coordination: Cerebellar testing reveals good finger-nose-finger and heel-to-shin bilaterally.  Gait and station: Gait is somewhat stooped, slight shuffling gait. With walking, there is decreased arm swing bilaterally, more prominent on the right. Tandem gait was not attempted. Romberg is negative.  Reflexes: Deep tendon reflexes are symmetric and normal bilaterally. Toes are downgoing bilaterally.   Assessment/Plan:  1. Parkinson's disease  2. Mild gait disorder  The drooling issues are likely related to Parkinson's disease. The patient has fairly typical symptoms for Parkinson's disease. The patient will be placed on low-dose Sinemet, beginning one half of a 25/100 mg tablet twice daily for 3 weeks, then taking one half tablet 3 times daily. They are to watch out for increasing confusion. We will follow-up in 3 months. If the drooling is not significantly improved, Botox injections can be done.   Jill Alexanders MD 10/23/2015 7:42 PM  [facility] [address] [place], [place] [address]  Phone [phone] Fax [phone]

## 2015-10-23 NOTE — Patient Instructions (Signed)
Sinemet (carbidopa) may result in confusion or hallucinations, drowsiness, nausea, or dizziness. If any significant side effects are noted, please contact our office. Sinemet may not be well absorbed when taken with high protein meals, if tolerated it is best to take 30-45 minutes before you eat.  Enfermedad de Parkinson  (Parkinson Disease) La enfermedad de Parkinson es un trastorno del sistema nervioso central, que afecta el cerebro y la mdula espinal. La persona que sufre esta enfermedad pierde la capacidad de controlar completamente los movimientos del cuerpo. Dentro del cerebro, hay un grupo de clulas nerviosas (ganglios basales) que ayudan al control de los movimientos. Los ganglios basales estn daados y no funcionan adecuadamente en una persona con enfermedad de Parkinson. Adems, los ganglios basales producen y [place] una sustancia qumica llamada dopamina. La domamina enva mensajes a otras partes del cuerpo para controlar y coordinar los movimientos corporales. En una persona que sufre la enfermedad de Parkinson los niveles de dopamina son bajos. Si los niveles de dopamina son bajos, el organismo no recibe los mensajes que necesita para moverse normalmente.  CAUSAS  La causa exacta del dao a los ganglios basales no se conoce. Algunas investigaciones mdicas consideran que las infecciones, los genes, el medio ambiente y ciertos medicamentos pueden contribuir a English as a second language teacher.  SNTOMAS   Uno de los primeros sntomas de esta enfermedad son las sacudidas incontrolables (temblor) en las manos. Generalmente el temblor desaparece cuando la mano afectada se utiliza concientemente.  El caminar, Electrical engineer, levantarse de la silla y los movimientos nuevos se hacen cada vez ms difciles a medida que la enfermedad avanza.  Los msculos se vuelven rgidos y los movimientos se hacen ms lentos.  El equilibrio y la coordinacin se hacen ms difciles.  Puede haber depresin, dificultad para tragar,  problemas urinarios, constipacin y problemas para dormir.  En etapas posteriores de la enfermedad, pueden deteriorarse los procesos de pensamiento y Sales promotion account executive. DIAGNSTICO  No hay ninguna prueba especfica que pueda diagnosticar la enfermedad de Parkinson. Podr ser derivado a un neurlogo para Neurosurgeon. El mdico revisar su historia [place], los sntomas y le har un examen fsico. [place] Comment indicarn anlisis de sangre y pruebas de diagnstico por imgenes del cerebro para descartar otras enfermedades. El diagnstico por imgenes podr incluir una resonancia magntica o una tomografa computada.  TRATAMIENTO  El objetivo del tratamiento es aliviar los sntomas. Podrn recetarle medicamentos una vez que los sntomas se vuelvan un problema. Los medicamentos no detendrn el avance de la enfermedad, pero pueden [employer] movimientos y el equilibrio y Air traffic controller a [employer]. Tambin se indica biorretroalimentacin y psicoterapia. En personas jvenes se podr optar por un tratamiento quirrgico del cerebro.  INSTRUCCIONES PARA EL CUIDADO EN EL HOGAR   Practique actividad fsica de [place] regular y tome perodos de descanso durante el da para ayudar a prevenir el cansancio y la depresin.  Si le resulta difcil vestirse, reemplace los botones y los cierres con Environmental education officer y elstico en la ropa.  Tome todos los medicamentos segn le indic su mdico.  Instale barras o rieles en su casa para evitar cadas.  Concurra a una terapia del habla segn las indicaciones.  Cumpla con todas las visitas de control, segn le indique su mdico. SOLICITE ATENCIN MDICA SI:   Los sntomas no se alivian con los [employer].  Se cae.  Tiene dificultad para tragar o se ahoga con los medicamentos. ASEGRESE DE QUE:   Comprende estas instrucciones.  Controlar su enfermedad.  Solicitar [employer]  de inmediato si no mejora o empeora.   Esta informacin no tiene Marine scientist el consejo  del mdico. Asegrese de hacerle al mdico cualquier pregunta que tenga.   Document Released: 03/10/2005 Document Revised: 09/25/2012 Elsevier Interactive Patient Education [employer].

## 2016-01-28 ENCOUNTER — Encounter: Payer: Self-pay | Admitting: Neurology

## 2016-01-28 ENCOUNTER — Ambulatory Visit (INDEPENDENT_AMBULATORY_CARE_PROVIDER_SITE_OTHER): Payer: Medicare Other | Admitting: Neurology

## 2016-01-28 VITALS — BP 132/83 | HR 58 | Ht 64.0 in | Wt 173.0 lb

## 2016-01-28 DIAGNOSIS — G20 Parkinson's disease: Secondary | ICD-10-CM

## 2016-01-28 DIAGNOSIS — K117 Disturbances of salivary secretion: Secondary | ICD-10-CM | POA: Diagnosis not present

## 2016-01-28 DIAGNOSIS — R269 Unspecified abnormalities of gait and mobility: Secondary | ICD-10-CM

## 2016-01-28 DIAGNOSIS — R413 Other amnesia: Secondary | ICD-10-CM

## 2016-01-28 MED ORDER — CARBIDOPA-LEVODOPA ER 25-100 MG PO TBCR: EXTENDED_RELEASE_TABLET | ORAL | 4 refills | Status: DC

## 2016-01-28 NOTE — Patient Instructions (Addendum)
Parkinson Disease Parkinson disease is a disorder of the central nervous system, which includes the brain and spinal cord. A person with this disease slowly loses the ability to completely control body movements. Within the brain, there is a group of nerve cells (basal ganglia) that help control movement. The basal ganglia are damaged and do not work properly in a person with Parkinson disease. In addition, the basal ganglia produce and use a brain chemical called dopamine. The dopamine chemical sends messages to other parts of the body to control and coordinate body movements. Dopamine levels are low in a person with Parkinson disease. If the dopamine levels are low, then the body does not receive the correct messages it needs to move normally.  CAUSES  The exact reason why the basal ganglia get damaged is not known. Some medical researchers have thought that infection, genes, environment, and certain medicines may contribute to the cause.  SYMPTOMS   An early symptom of Parkinson disease is often an uncontrolled shaking (tremor) of the hands. The tremor will often disappear when the affected hand is consciously used.  As the disease progresses, walking, talking, getting out of a chair, and new movements become more difficult.  Muscles get stiff and movements become slower.  Balance and coordination become harder.  Depression, trouble swallowing, urinary problems, constipation, and sleep problems can occur.  Later in the disease, memory and thought processes may deteriorate. DIAGNOSIS  There are no specific tests to diagnose Parkinson disease. You may be referred to a neurologist for evaluation. Your caregiver will ask about your medical history, symptoms, and perform a physical exam. Blood tests and imaging tests of your brain may be performed to rule out other diseases. The imaging tests may include an MRI or a CT scan. TREATMENT  The goal of treatment is to relieve symptoms. Medicines may be  prescribed once the symptoms become troublesome. Medicine will not stop the progression of the disease, but medicine can make movement and balance better and help control tremors. Speech and occupational therapy may also be prescribed. Sometimes, surgical treatment of the brain can be done in young people. HOME CARE INSTRUCTIONS  Get regular exercise and rest periods during the day to help prevent exhaustion and depression.  If getting dressed becomes difficult, replace buttons and zippers with Velcro and elastic on your clothing.  Take all medicine as directed by your caregiver.  Install grab bars or railings in your home to prevent falls.  Go to speech or occupational therapy as directed.  Keep all follow-up visits as directed by your caregiver. SEEK MEDICAL CARE IF:  Your symptoms are not controlled with your medicine.  You fall.  You have trouble swallowing or choke on your food. MAKE SURE YOU:  Understand these instructions.  Will watch your condition.  Will get help right away if you are not doing well or get worse.   This information is not intended to replace advice given to you by your health care provider. Make sure you discuss any questions you have with your health care provider.   Document Released: 05/28/2000 Document Revised: 09/25/2012 Document Reviewed: 06/30/2011 Elsevier Interactive Patient Education 2016 Elsevier Inc.  

## 2016-01-28 NOTE — Progress Notes (Signed)
Reason for visit: Parkinson's disease  [name] is an 82 y.o. male  History of present illness:  [name] is a 82 year old right-handed Hispanic male with a history of Parkinson's disease. The patient has had a mild gait disorder, he denies any falls. He has a memory disorder as well. The patient is mainly concerned about drooling issues that has been present for a couple years. The patient has not consistently taken his medication since last seen, he took a trip to [place], he has returned on 01/20/2016. He walked quite a bit in [place], but he has not been active once he has returned. The patient denies any issues with choking with swallowing. The wife is very concerned that he is not very active.  Past Medical History:  Diagnosis Date  . Allergy    rhinitis  . Diabetes mellitus    dx 3-11, A1C 6.7  . Gout   . Hiatal hernia   . Hyperlipidemia   . Parkinson disease ([place]) 10/23/2015  . Prostate CA [facility]) 2002   s/p  x-ray therapy and surgery  . Skin cancer   . Trigeminal neuralgia    h/o , status post gamma knife procedure and trial with multiple drugs    Past Surgical History:  Procedure Laterality Date  . CHOLECYSTECTOMY      Family History  Problem Relation Age of Onset  . Cancer Neg Hx     colon  . Heart failure Mother   . Neuropathy Sister   . Diabetes Sister   . Kidney failure Sister   . Heart disease Brother   . Heart disease Sister   . Heart disease Brother   . Heart disease Brother     Social history:  reports that he has quit smoking. He has never used smokeless tobacco. He reports that he does not drink alcohol or use drugs.   No Known Allergies  Medications:  Prior to Admission medications   Medication Sig Start Date End Date Taking? Authorizing Provider  Ascorbic Acid (VITAMIN C) 1000 MG tablet Take 1,000 mg by mouth daily.   Yes Historical Provider, MD  Carbidopa-Levodopa ER (SINEMET CR) 25-100 MG tablet controlled release 1/2 tablet twice a day  for 3 weeks, then take 1/2 tablet three times a day 10/23/15  Yes Kathrynn Ducking, MD  clotrimazole-betamethasone (LOTRISONE) cream apply to affected area twice a day topically 03/25/14  Yes Colon Branch, MD  Multiple Vitamin (MULTIVITAMIN) tablet Take by mouth.   Yes Historical Provider, MD  simvastatin (ZOCOR) 40 MG tablet Take 1 tablet (40 mg total) by mouth at bedtime. 02/20/15  Yes Colon Branch, MD    ROS:  Out of a complete 14 system review of symptoms, the patient complains only of the following symptoms, and all other reviewed systems are negative.  Fatigue Drooling Eye redness Cough Leg swelling Frequency of urination Memory loss  Blood pressure 132/83, pulse (!) 58, height 5\' 4"  (1.626 m), weight 173 lb (78.5 kg).  Physical Exam  General: The patient is alert and cooperative at the time of the examination.  Skin: No significant peripheral edema is noted.   Neurologic Exam  Mental status: The patient is alert and oriented x 3 at the time of the examination.    Cranial nerves: Facial symmetry is present. Speech is normal, no aphasia or dysarthria is noted. Extraocular movements are full. Visual fields are full. Masking of the face is seen.  Motor: The patient has good strength in  all 4 extremities.  Sensory examination: Soft touch sensation is symmetric on the face, arms, and legs.  Coordination: The patient has good finger-nose-finger and heel-to-shin bilaterally. Some apraxia with the use of the extremities is noted.  Gait and station: The patient is able to walk without assistance. There is slight decrease in arm swing on the right as compared to the left. Romberg is negative. Tandem gait was not attempted.  Reflexes: Deep tendon reflexes are symmetric.   Assessment/Plan:  1. Parkinson's disease  2. Gait disorder  3. Memory disorder  4. Drooling  The patient is mainly concerned about the drooling issue. We will consider Botox for this issue. The patient was  placed back on Sinemet taking one half tablet of the 25/100 milligrams tablet of Sinemet, one half tablet 3 times daily. He will follow-up in 5 months. I have encouraged him to remain active.  Jill Alexanders MD 01/28/2016 1:36 PM  [facility] [address] [place], [place] [address]  Phone [phone] Fax [phone]

## 2016-02-18 ENCOUNTER — Encounter: Payer: Self-pay | Admitting: Neurology

## 2016-02-18 ENCOUNTER — Ambulatory Visit (INDEPENDENT_AMBULATORY_CARE_PROVIDER_SITE_OTHER): Payer: Medicare Other | Admitting: Neurology

## 2016-02-18 VITALS — BP 162/73 | HR 61 | Ht 64.0 in | Wt 170.0 lb

## 2016-02-18 DIAGNOSIS — K117 Disturbances of salivary secretion: Secondary | ICD-10-CM

## 2016-02-18 MED ORDER — ONABOTULINUMTOXINA 100 UNITS IJ SOLR
100.0000 [IU] | Freq: Once | INTRAMUSCULAR | Status: AC
  Administered 2016-02-18: 100 [IU] via INTRAMUSCULAR

## 2016-02-18 NOTE — Progress Notes (Signed)
Please refer to salivary gland injection procedure note.

## 2016-02-18 NOTE — Procedures (Signed)
      History: [name] is a 82 year old patient with a history of Parkinson's disease with associated sialorrhea. The drooling is quite a problem for him, he comes in for Botox injections for treatment of this issue.  Description of procedure: The patient was injected on both sides with Botox, 40 units in the parotid gland bilaterally and 10 units in the submandibular gland bilaterally.  The patient tolerated the procedure well, no complications were noted.  [place]: [id number] 1-0 2  Lot number: [id number] Expiration date: March 2020

## 2016-07-12 ENCOUNTER — Telehealth: Payer: Self-pay | Admitting: Neurology

## 2016-07-12 NOTE — Telephone Encounter (Signed)
Wife cancelled Wednesday by mistake through automated sytsem. Next availabe apt. Is in June. Wife has questions about meds.  Best call back [phone].

## 2016-07-13 ENCOUNTER — Telehealth: Payer: Self-pay | Admitting: Neurology

## 2016-07-13 MED ORDER — CARBIDOPA-LEVODOPA ER 25-100 MG PO TBCR: EXTENDED_RELEASE_TABLET | ORAL | 11 refills | Status: DC

## 2016-07-13 NOTE — Telephone Encounter (Signed)
Returned call to pt's wife. Appt r/s to Feb 20th w/ noon arrival time. She was very Patent attorney.

## 2016-07-13 NOTE — Telephone Encounter (Signed)
Patients wife forgot to mention he is needing a refill for Carbidopa-Levodopa ER (SINEMET CR) 25-100 MG tablet controlled release and simvastatin (ZOCOR) 40 MG tablet.  [place]

## 2016-07-13 NOTE — Addendum Note (Signed)
Addended by: Monte Fantasia on: 07/13/2016 04:40 PM   Modules accepted: Orders

## 2016-07-13 NOTE — Telephone Encounter (Signed)
I will refill Sinemet, the simvastatin refill goes through Dr. Larose Kells.

## 2016-07-13 NOTE — Addendum Note (Signed)
Addended by: Margette Fast on: 07/13/2016 05:34 PM   Modules accepted: Orders

## 2016-07-14 ENCOUNTER — Ambulatory Visit: Payer: Medicare Other | Admitting: Neurology

## 2016-08-03 ENCOUNTER — Encounter: Payer: Self-pay | Admitting: Neurology

## 2016-08-03 ENCOUNTER — Ambulatory Visit (INDEPENDENT_AMBULATORY_CARE_PROVIDER_SITE_OTHER): Payer: Medicare Other | Admitting: Neurology

## 2016-08-03 VITALS — BP 168/75 | HR 56 | Ht 64.0 in | Wt 168.0 lb

## 2016-08-03 DIAGNOSIS — R269 Unspecified abnormalities of gait and mobility: Secondary | ICD-10-CM

## 2016-08-03 DIAGNOSIS — G20 Parkinson's disease: Secondary | ICD-10-CM

## 2016-08-03 DIAGNOSIS — R413 Other amnesia: Secondary | ICD-10-CM | POA: Diagnosis not present

## 2016-08-03 DIAGNOSIS — K117 Disturbances of salivary secretion: Secondary | ICD-10-CM

## 2016-08-03 NOTE — Progress Notes (Signed)
Reason for visit: Parkinson's disease  [name] is an 83 y.o. male  History of present illness:  [name] is a 83 year old right-handed Hispanic male with a history of Parkinson's disease. The patient is on low-dose Sinemet taking the 25/100 mg tablet, one half tablet 3 times daily. He has not had any falls, he does not use a cane or walker for ambulation. The patient is not having any problems with swallowing. He is having drooling problems that are ongoing. He had a Botox injection for this in September 2017 which was quite effective, lasting 3 and 1/2 months. The patient requires another injection at this point. He does have some memory issues, this has gradually progressed over time. He returns for an evaluation.  Past Medical History:  Diagnosis Date  . Allergy    rhinitis  . Diabetes mellitus    dx 3-11, A1C 6.7  . Gout   . Hiatal hernia   . Hyperlipidemia   . Parkinson disease ([place]) 10/23/2015  . Prostate CA [facility]) 2002   s/p  x-ray therapy and surgery  . Skin cancer   . Trigeminal neuralgia    h/o , status post gamma knife procedure and trial with multiple drugs    Past Surgical History:  Procedure Laterality Date  . CHOLECYSTECTOMY      Family History  Problem Relation Age of Onset  . Heart failure Mother   . Neuropathy Sister   . Diabetes Sister   . Kidney failure Sister   . Heart disease Brother   . Heart disease Sister   . Heart disease Brother   . Heart disease Brother   . Cancer Neg Hx     colon    Social history:  reports that he has quit smoking. He has never used smokeless tobacco. He reports that he does not drink alcohol or use drugs.   No Known Allergies  Medications:  Prior to Admission medications   Medication Sig Start Date End Date Taking? Authorizing Provider  Ascorbic Acid (VITAMIN C) 1000 MG tablet Take 1,000 mg by mouth daily.   Yes Historical Provider, MD  Carbidopa-Levodopa ER (SINEMET CR) 25-100 MG tablet controlled release  take 1/2 tablet three times a day 07/13/16  Yes Kathrynn Ducking, MD  clotrimazole-betamethasone (LOTRISONE) cream apply to affected area twice a day topically 03/25/14  Yes Colon Branch, MD  Multiple Vitamin (MULTIVITAMIN) tablet Take 1 tablet by mouth daily. Centrum silver   Yes Historical Provider, MD  simvastatin (ZOCOR) 40 MG tablet Take 1 tablet (40 mg total) by mouth at bedtime. 02/20/15  Yes Colon Branch, MD    ROS:  Out of a complete 14 system review of symptoms, the patient complains only of the following symptoms, and all other reviewed systems are negative.  Hearing loss, drooling Constipation Daytime sleepiness Frequency of urination Memory loss  Blood pressure (!) 168/75, pulse (!) 56, height 5\' 4"  (1.626 m), weight 168 lb (76.2 kg).  Physical Exam  General: The patient is alert and cooperative at the time of the examination.  Skin: No significant peripheral edema is noted.   Neurologic Exam  Mental status: The patient is alert and oriented x 3 at the time of the examination. The patient has apparent normal recent and remote memory, with an apparently normal attention span and concentration ability.   Cranial nerves: Facial symmetry is present. Speech is normal, no aphasia or dysarthria is noted. Extraocular movements are full. Visual fields are full. Masking  of the face is seen.  Motor: The patient has good strength in all 4 extremities.  Sensory examination: Soft touch sensation is symmetric on the face, arms, and legs.  Coordination: The patient has good finger-nose-finger and heel-to-shin bilaterally.  Gait and station: The patient has the ability to and leg independently, some decrease in arm swing on the right. Tandem gait was not attempted. Romberg is negative. No drift is seen.  Reflexes: Deep tendon reflexes are symmetric.   Assessment/Plan:  1. Parkinson's disease  2. Sialorrhea  The patient responded well to the Botox treatment previously, we will  try to get this set up again. The patient will continue on Sinemet 25/100 mg tablet taking one half tablet 3 times daily. He will follow-up in 5 months.  Jill Alexanders MD 08/03/2016 12:51 PM  [facility] [address] [place], [place] [address]  Phone [phone] Fax [phone]

## 2016-08-05 ENCOUNTER — Telehealth: Payer: Self-pay | Admitting: Neurology

## 2016-08-05 NOTE — Telephone Encounter (Signed)
Dr Jannifer Franklin- spoke with [name] and thinks we can do buy and bill for his botox. She is going to call his insurance to make sure. Their insurance is changing on the 1st. Can we fit him in either Monday, Tuesday, or Wednesday at the end of the day?

## 2016-08-05 NOTE — Telephone Encounter (Signed)
[name]- let me know which day works for pt and I can put him on schedule next week.

## 2016-08-05 NOTE — Telephone Encounter (Signed)
This patients wife called and stated that the patient was in here earlier in the week and spoke with Dr. Jannifer Franklin about scheduling an injection before he leaves to go out of town on March the 2nd. Dr. Jannifer Franklin has no availability but she says that Dr. Jannifer Franklin is aware he needs to be seen before then. Is there somewhere he can be worked in?

## 2016-08-05 NOTE — Telephone Encounter (Signed)
Okay to have the patient come in if in the day any day, this will not take long to do.

## 2016-08-06 NOTE — Telephone Encounter (Signed)
Noted, thank you Terrence Dupont!

## 2016-08-06 NOTE — Telephone Encounter (Signed)
Pt placed on schedule on Wednesday, thank you

## 2016-08-06 NOTE — Telephone Encounter (Signed)
Wednesday would be great [name], thank you.

## 2016-08-09 ENCOUNTER — Telehealth: Payer: Self-pay | Admitting: Neurology

## 2016-08-09 NOTE — Telephone Encounter (Signed)
Patient showed up to the office today asking if the Botox has arrived. They want a sooner apt if it is as he will be travelling. Best call back is [phone]

## 2016-08-09 NOTE — Telephone Encounter (Signed)
Called and LVM for pt/wife to call. Advised there is appt tomorrow at 4pm, check in 345pm if they can come tomorrow instead of Wed. Asked them to call ASAP and let is know if this works.

## 2016-08-09 NOTE — Telephone Encounter (Signed)
Called and spoke with wife. Scheduled appt for 2/27 at 4pm instead. Cx appt for 2/28 at 4pm for botox.

## 2016-08-10 ENCOUNTER — Encounter: Payer: Self-pay | Admitting: Neurology

## 2016-08-10 ENCOUNTER — Ambulatory Visit (INDEPENDENT_AMBULATORY_CARE_PROVIDER_SITE_OTHER): Payer: Medicare Other | Admitting: Neurology

## 2016-08-10 VITALS — BP 175/80 | HR 63 | Ht 64.0 in

## 2016-08-10 DIAGNOSIS — K117 Disturbances of salivary secretion: Secondary | ICD-10-CM

## 2016-08-10 MED ORDER — ONABOTULINUMTOXINA 100 UNITS IJ SOLR
100.0000 [IU] | Freq: Once | INTRAMUSCULAR | Status: AC
  Administered 2016-08-10: 100 [IU] via INTRAMUSCULAR

## 2016-08-10 NOTE — Progress Notes (Signed)
Please refer to Botox procedure note. 

## 2016-08-10 NOTE — Procedures (Signed)
     History: [name] is a 83 year old patient with a history of Parkinson's disease with associated sialorrhea. The drooling is quite a problem for him, he comes in for Botox injections for treatment of this issue.  Description of procedure: The patient was injected on both sides with Botox, 40 units in the parotid gland bilaterally and 10 units in the submandibular gland bilaterally.  The patient tolerated the procedure well, no complications were noted.  [place]: [id number] 1-0 2  Lot number: [id number] Expiration date: August 2020

## 2016-08-11 ENCOUNTER — Ambulatory Visit: Payer: Self-pay | Admitting: Neurology

## 2016-08-11 NOTE — Telephone Encounter (Signed)
Patient did not stop to schedule follow up injection. I called the patient to scheduled injection. He did not answer.

## 2016-11-17 ENCOUNTER — Ambulatory Visit: Payer: Medicare Other | Admitting: Neurology

## 2017-01-07 ENCOUNTER — Ambulatory Visit: Payer: Medicare Other | Admitting: Neurology

## 2017-02-07 ENCOUNTER — Telehealth: Payer: Self-pay | Admitting: Neurology

## 2017-02-07 NOTE — Telephone Encounter (Signed)
Pt's wife called she thought botox appt was 8/28. I told her Dr Jannifer Franklin is not in the office this week and the pt's next appt is 9/5 but it is not scheduled for botox. She is wanting to know if this can be changed to botox or shedule appt for botox. Thank you!

## 2017-02-08 ENCOUNTER — Ambulatory Visit: Payer: Medicare Other | Admitting: Neurology

## 2017-02-08 NOTE — Telephone Encounter (Signed)
I called the patients wife back and changed the apt to an injection because she states that his September 5th apt should be an injection.

## 2017-02-16 ENCOUNTER — Ambulatory Visit (INDEPENDENT_AMBULATORY_CARE_PROVIDER_SITE_OTHER): Payer: Medicare Other | Admitting: Neurology

## 2017-02-16 ENCOUNTER — Telehealth: Payer: Self-pay | Admitting: Neurology

## 2017-02-16 VITALS — BP 158/70 | HR 60 | Ht 64.0 in

## 2017-02-16 DIAGNOSIS — K117 Disturbances of salivary secretion: Secondary | ICD-10-CM

## 2017-02-16 DIAGNOSIS — R269 Unspecified abnormalities of gait and mobility: Secondary | ICD-10-CM

## 2017-02-16 DIAGNOSIS — G20 Parkinson's disease: Secondary | ICD-10-CM | POA: Diagnosis not present

## 2017-02-16 MED ORDER — ONABOTULINUMTOXINA 100 UNITS IJ SOLR
100.0000 [IU] | Freq: Once | INTRAMUSCULAR | Status: AC
  Administered 2017-02-16: 100 [IU] via INTRAMUSCULAR

## 2017-02-16 MED ORDER — CARBIDOPA-LEVODOPA ER 25-100 MG PO TBCR: 1.0000 | EXTENDED_RELEASE_TABLET | Freq: Three times a day (TID) | ORAL | 11 refills | Status: DC

## 2017-02-16 NOTE — Addendum Note (Signed)
Addended by: Kathrynn Ducking on: 02/16/2017 06:12 PM   Modules accepted: Orders

## 2017-02-16 NOTE — Patient Instructions (Signed)
   We will increase the Sinemet tablets to one three times a day.  We will get physical therapy for gait training.  Sinemet (carbidopa) may result in confusion or hallucinations, drowsiness, nausea, or dizziness. If any significant side effects are noted, please contact our office. Sinemet may not be well absorbed when taken with high protein meals, if tolerated it is best to take 30-45 minutes before you eat.

## 2017-02-16 NOTE — Procedures (Signed)
      History: [name] a 83 year old patient with a history of Parkinson's disease with associated sialorrhea. The drooling is quite a problem for him, he comes in for Botox injections for treatment of this issue.  Description of procedure: The patient was injected on both sides with Botox, 40 units in the parotid gland bilaterally and 10 units in the submandibular gland bilaterally.  The patient tolerated the procedure well, no complications were noted.  [id number] 1-0 2  Lot number: [id number] Expiration date: March 2021

## 2017-02-16 NOTE — Telephone Encounter (Signed)
Pt needs botox °

## 2017-02-16 NOTE — Progress Notes (Signed)
Reason for visit: Parkinson's disease  [name] is an 83 y.o. male  History of present illness:  [name] is a 83 year old right-handed Hispanic male with a history of Parkinson's disease associated with a gait disorder and a memory disorder. The patient is becoming somewhat forgetful, he will leave the lights on or leave the water faucet on. The patient has had a decline in his ability to ambulate, he has had 2 falls within the last several months. He will have episodes where he speeds up with his walking and cannot stop. The patient is on a very low-dose Sinemet regimen taking one half of a 25/100 mg tablet 3 times daily. The patient continues to have problems with drooling, he gets Botox injections for this issue which helps for about 3-3-1/2 months. The patient denies any problems with choking with swallowing. He returns for an evaluation.   Past Medical History:  Diagnosis Date  . Allergy    rhinitis  . Diabetes mellitus    dx 3-11, A1C 6.7  . Gout   . Hiatal hernia   . Hyperlipidemia   . [place] disease ([place]) 10/23/2015  . Prostate CA [facility]) 2002   s/p  x-ray therapy and surgery  . Skin cancer   . Trigeminal neuralgia    h/o , status post gamma knife procedure and trial with multiple drugs    Past Surgical History:  Procedure Laterality Date  . CHOLECYSTECTOMY      Family History  Problem Relation Age of Onset  . Heart failure Mother   . Neuropathy Sister   . Diabetes Sister   . Kidney failure Sister   . Heart disease Brother   . Heart disease Sister   . Heart disease Brother   . Heart disease Brother   . Cancer Neg Hx        colon    Social history:  reports that he has quit smoking. He has never used smokeless tobacco. He reports that he does not drink alcohol or use drugs.   No Known Allergies  Medications:  Prior to Admission medications   Medication Sig Start Date End Date Taking? Authorizing Provider  Ascorbic Acid (VITAMIN C) 1000 MG tablet  Take 1,000 mg by mouth daily.   Yes [provider]  Carbidopa-Levodopa ER (SINEMET CR) 25-100 MG tablet controlled release Take 1 tablet by mouth 3 (three) times daily. 02/16/17  Yes Kathrynn Ducking, MD  clotrimazole-betamethasone (LOTRISONE) cream apply to affected area twice a day topically 03/25/14  Yes Paz, Alda Berthold, MD  Multiple Vitamin (MULTIVITAMIN) tablet Take 1 tablet by mouth daily. Centrum silver   Yes [provider]  simvastatin (ZOCOR) 40 MG tablet Take 1 tablet (40 mg total) by mouth at bedtime. 02/20/15  Yes Paz, Alda Berthold, MD    ROS:  Out of a complete 14 system review of symptoms, the patient complains only of the following symptoms, and all other reviewed systems are negative.  Hearing loss Drooling Cough Constipation Daytime drowsiness Frequency of urination Walking difficulty Moles Bruising easily  Blood pressure (!) 158/70, pulse 60, height 5\' 4"  (1.626 m), SpO2 95 %.  Physical Exam  General: The patient is alert and cooperative at the time of the examination.  Skin: No significant peripheral edema is noted.   Neurologic Exam  Mental status: The patient is alert and oriented x 3 at the time of the examination. The patient has apparent normal recent and remote memory, with an apparently normal attention  span and concentration ability.   Cranial nerves: Facial symmetry is present. Speech is normal, no aphasia or dysarthria is noted. Extraocular movements are full. Visual fields are full. Masking of the face is seen.  Motor: The patient has good strength in all 4 extremities.  Sensory examination: Soft touch sensation is symmetric on the face, arms, and legs.  Coordination: The patient has good finger-nose-finger and heel-to-shin bilaterally.  Gait and station: The patient has the ability to ambulate independently, he has some hesitancy with turns. Tandem gait was not attempted. Romberg is negative.  Reflexes: Deep tendon reflexes are  symmetric.   Assessment/Plan:  1. Parkinson's disease  2. Gait disorder  3. Reported memory disorder  4. Advanced age  The patient is having some decline in his ability to ambulate, will increase the Sinemet dose taking the 25/100 mg tablets taking 1 full tablet 3 times daily. We will initiate physical therapy for gait training, he will follow-up in 4 months. The patient is getting benefit with the Botox injections for the drooling.   Jill Alexanders MD 02/16/2017 10:04 AM  [facility] [address] [place], [place] [address]  Phone [phone] Fax [phone]

## 2017-02-23 NOTE — Telephone Encounter (Signed)
I called to scheduled the patient for his next injection, he did not answer so I left a VM asking him to call me back.

## 2017-03-28 ENCOUNTER — Ambulatory Visit: Payer: Medicare Other | Attending: Neurology | Admitting: Physical Therapy

## 2017-03-28 DIAGNOSIS — R29818 Other symptoms and signs involving the nervous system: Secondary | ICD-10-CM | POA: Insufficient documentation

## 2017-03-28 DIAGNOSIS — R2681 Unsteadiness on feet: Secondary | ICD-10-CM | POA: Diagnosis not present

## 2017-03-28 DIAGNOSIS — R2689 Other abnormalities of gait and mobility: Secondary | ICD-10-CM

## 2017-03-28 NOTE — Therapy (Signed)
[facility] [address] [place], [place], [address] Phone: [phone]   Fax:  [phone]  Physical Therapy Evaluation  Patient Details  Name: [name] MRN: [id number] Date of Birth: 1918-07-26 Referring Provider: Lenor Coffin  Encounter Date: 03/28/2017      PT End of Session - 03/28/17 1450    Visit Number 1   Number of Visits 10   Date for PT Re-Evaluation 05/27/17   Authorization Type Medicare primary, BCBS secondary-GCODE needed every 10th visit   PT Start Time 1232   PT Stop Time 1315   PT Time Calculation (min) 43 min   Activity Tolerance Patient tolerated treatment well   Behavior During Therapy [facility] for tasks assessed/performed      Past Medical History:  Diagnosis Date  . Allergy    rhinitis  . Diabetes mellitus    dx 3-11, A1C 6.7  . Gout   . Hiatal hernia   . Hyperlipidemia   . Parkinson disease ([place]) 10/23/2015  . Prostate CA [facility]) 2002   s/p  x-ray therapy and surgery  . Skin cancer   . Trigeminal neuralgia    h/o , status post gamma knife procedure and trial with multiple drugs    Past Surgical History:  Procedure Laterality Date  . CHOLECYSTECTOMY      There were no vitals filed for this visit.       Subjective Assessment - 03/28/17 1236    Subjective Pt has had two falls in the past 6 months.  My balance is low.  If he gets pushed a little bit, he may fall (per wife).  He walks very slowly, but he doesn't want to use cane.  Wife reports he sped up with walking and just fell forward.   Patient is accompained by: Family member  wife   Patient Stated Goals To get more firm/steady standing and balance with walking; and to walk faster.   Currently in Pain? No/denies            [facility] PT Assessment - 03/28/17 1244      Assessment   Medical Diagnosis Parkinson's disease   Referring Provider Lenor Coffin   Onset Date/Surgical Date 02/16/17  MD visit     Precautions   Precautions Fall     Balance Screen   Has the patient fallen in the past 6 months Yes   How many times? 2   Has the patient had a decrease in activity level because of a fear of falling?  Yes  going down the steps   Is the patient reluctant to leave their home because of a fear of falling?  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     Observation/Other Assessments   Focus on Therapeutic Outcomes (FOTO)  NA     Posture/Postural Control   Posture/Postural Control  Postural limitations   Postural Limitations Forward head     ROM / Strength   AROM / PROM / Strength Strength     Strength   Overall Strength Deficits   Strength Assessment Site Hip;Knee;Ankle   Right/Left Hip Right;Left   Right Hip Flexion 3+/5   Left Hip Flexion 4/5   Right/Left Knee Right;Left   Right Knee Flexion 4/5   Right Knee Extension 4/5   Left Knee Flexion 4/5   Left Knee Extension 4/5   Right/Left Ankle Right;Left   Right Ankle Dorsiflexion 4/5   Left Ankle Dorsiflexion 4/5     Transfers   Transfers Sit to Stand;Stand to Sit   Sit to Stand 5: Supervision;Without upper extremity assist;From chair/3-in-1   Five time sit to stand comments  18.96   Stand to Sit 5: Supervision;4: Min guard;Without upper extremity assist;To chair/3-in-1   Transfer Cueing one episode of forward lean upon  standing with one step to correct     Ambulation/Gait   Ambulation/Gait Yes   Ambulation Distance (Feet) 150 Feet   Assistive device None   Gait Pattern Step-through pattern;Decreased arm swing - right;Decreased arm swing - left;Decreased step length - right;Decreased step length - left;Narrow base of support;Poor foot clearance - left   Ambulation Surface Level;Indoor   Gait velocity 15.56 sec = 2.11 ft/sec     Standardized Balance Assessment   Standardized Balance Assessment Timed Up and Go Test;Dynamic Gait Index     Dynamic Gait Index   Level Surface Mild Impairment   Change in Gait Speed Mild Impairment   Gait with Horizontal Head Turns Mild Impairment   Gait with Vertical Head Turns Mild Impairment   Gait and Pivot Turn Mild Impairment   Step Over Obstacle Moderate Impairment   Step Around Obstacles Moderate Impairment   Steps Mild Impairment   Total Score 14   DGI comment: Scores <19/24 indicate increased fall risk.     Timed Up and Go Test   Normal TUG (seconds) 19.87   TUG Comments Scores >13.5 seconds indicate increased fall risk.            Objective measurements completed on examination: See above findings.                       PT Long Term Goals - 03/28/17 1458      PT LONG TERM GOAL #1   Title Pt will perform HEP with wife's supervision, to address Parkinson's specific deficits.  TARGET 04/29/17   Time 5   Period Weeks   Status New   Target Date 04/29/17     PT LONG TERM GOAL #2   Title Pt will improve TUG score to less than or equal to 15 seconds for decreased fall risk.   Time 5   Period Weeks   Status New   Target Date 04/29/17     PT LONG TERM GOAL #3   Title Pt will improve DGI score to at least 18/24 for decreased fall risk.   Time 5   Period Weeks   Status New   Target Date 04/29/17     PT LONG TERM GOAL #4   Title Pt/wife will verbalize understanding of techniques to decrease episodes of hastening with gait.    Time 5   Period Weeks   Status New   Target Date 04/29/17     PT LONG TERM GOAL #5   Title Pt will ambulate at least 500 ft, indoor  and outdoor surfaces, with supervision with appropriate assistive device, for improved community mobility.   Time 5   Period Weeks   Status New   Target Date 04/29/17     Additional Long Term Goals   Additional Long Term Goals Yes     PT LONG TERM GOAL #6   Title Pt/wife will verbalize understanding of fall prevention in home environment.   Time 5   Period Weeks   Status New   Target Date 04/29/17                Plan - Apr 19, 2017 1451    Clinical Impression Statement Pt is a 83 year old male who presents to OP PT with history of Parkinson's disease, with gait instability, history of 2 falls in the past  6 months.  Pt presents with decreased timing and coordination with gait, decreased strength, decreased balance, bradykinesia, episodes of hastening with gait, history of falls.  Pt is at fall risk per TUG and gait velocity scores.  Pt has been walking with wife daily (decreased from 20 blocks to 8 blocks).  Pt would benefit from skilled PT to address the above stated deficits to decrease fall risk and improve functional mobility.   History and Personal Factors relevant to plan of care: >3 co-morbidities in PMH, >3 systems involved, history of falls   Clinical Presentation Stable   Clinical Presentation due to: fall risk per TUG and DGI scores, history of 2 falls in past 6 months   Clinical Decision Making Low   Rehab Potential Good   Clinical Impairments Affecting Rehab Potential Advanced age; wife is present and appears motivated for helping patient with therapy exercises/recommendations   PT Frequency --  1x/wk for 1 week, then 2x/wk for 4 weeks   PT Duration Other (comment)  POC = 5 weeks   PT Treatment/Interventions ADLs/Self Care Home Management;Functional mobility training;Therapeutic activities;Therapeutic exercise;Balance  training;Neuromuscular re-education;Patient/family education;Gait training;DME Instruction   PT Next Visit Plan Initiate HEP for balance and posture; gait training (emphasis on heelstrike, arm swing and step length); cueing to patient/wife to avoid hastening with gait episodes   Consulted and Agree with Plan of Care Patient;Family member/caregiver   Family Member Consulted wife      Patient will benefit from skilled therapeutic intervention in order to improve the following deficits and impairments:  Abnormal gait, Decreased balance, Decreased mobility, Decreased strength, Difficulty walking, Postural dysfunction  Visit Diagnosis: Other abnormalities of gait and mobility  Unsteadiness on feet  Other symptoms and signs involving the nervous system      G-Codes - 2017/04/19 1502    Functional Assessment Tool Used (Outpatient Only) gait velocity 2.11 ft/sec, 5x 18.96 sec, TUG 19.87 sec, DGI 14/24; 2 falls   Functional Limitation Mobility: Walking and moving around   Mobility: Walking and Moving Around Current Status [phone]) At least 40 percent but less than 60 percent impaired, limited or restricted   Mobility: Walking and Moving Around Goal Status [phone]) At least 20 percent but less than 40 percent impaired, limited or restricted       Problem List Patient Active Problem List   Diagnosis Date Noted  . Sialorrhea 01/28/2016  . Memory disorder 01/28/2016  . Parkinson disease ([place]) 10/23/2015  . Gait disorder 02/24/2014  . Edema 09/28/2011  . Bronchospasm 08/18/2011  . Annual physical exam 03/31/2011  . RASH-NONVESICULAR 06/09/2010  . ANEMIA 12/30/2009  . DM 09/01/2009  . ALLERGIC RHINITIS 08/20/2009  . NEOPLASM, MALIGNANT, SKIN 08/30/2007  .  ADENOCARCINOMA, PROSTATE, HX OF 03/21/2007  . HYPERLIPIDEMIA 09/29/2006  . GOUT 09/29/2006  . HIATAL HERNIA 09/29/2006    Malone Vanblarcom W. 03/28/2017, 3:03 PM Frazier Butt., PT  [facility] [address] [place], [place], [address] Phone: [phone]   Fax:  [phone]  Name: [name] MRN: [id number] Date of Birth: 1919/05/08

## 2017-03-29 ENCOUNTER — Ambulatory Visit: Payer: Medicare Other | Admitting: Physical Therapy

## 2017-03-29 DIAGNOSIS — R29818 Other symptoms and signs involving the nervous system: Secondary | ICD-10-CM | POA: Diagnosis not present

## 2017-03-29 DIAGNOSIS — R2689 Other abnormalities of gait and mobility: Secondary | ICD-10-CM | POA: Diagnosis not present

## 2017-03-29 DIAGNOSIS — R2681 Unsteadiness on feet: Secondary | ICD-10-CM | POA: Diagnosis not present

## 2017-03-29 NOTE — Patient Instructions (Addendum)
Tips to reduce freezing or hastening episodes with standing or walking:  1. Stand tall with your feet wide, so that you can rock and weight shift through your hips. 2. Don't try to fight the freeze: if you begin taking slower, faster, smaller steps, STOP, get your posture tall, and RESET your posture and balance.  Take a deep breath before taking the BIG step to start again. 3. March in place, with high knee stepping, to get started walking again. 4. Use auditory cues:  Count out loud, think of a familiar tune or song or cadence, use pocket metronome, to use rhythm to get started walking again. 5. Use visual cues:  Use a line to step over, use laser pointer line to step over, (using BIG steps) to start walking again. 6. Use visual targets to keep your posture tall (look ahead and focus on an object or target at eye level). 7. As you approach where your destination with walking, count your steps out loud and/or focus on your target with your eyes until you are fully there. 8. Use appropriate assistive device, as advised by your physical therapist to assist with taking longer, consistent steps.     To be performed up to 20 reps, once per day, standing at counter or chair for support

## 2017-03-30 NOTE — Therapy (Addendum)
[facility] [address] [place], [place], [address] Phone: [phone]   Fax:  [phone]  Physical Therapy Treatment  Patient Details  Name: [name] MRN: [id number] Date of Birth: May 22, 1919 Referring Provider: Lenor Coffin  Encounter Date: 03/29/2017      PT End of Session - 03/30/17 1048    Visit Number 2   Number of Visits 10   Date for PT Re-Evaluation 05/27/17   Authorization Type Medicare primary, BCBS secondary-GCODE needed every 10th visit   PT Start Time 1150   PT Stop Time 1232   PT Time Calculation (min) 42 min   Activity Tolerance Patient tolerated treatment well   Behavior During Therapy [facility] for tasks assessed/performed      Past Medical History:  Diagnosis Date  . Allergy    rhinitis  . Diabetes mellitus    dx 3-11, A1C 6.7  . Gout   . Hiatal hernia   . Hyperlipidemia   . Parkinson disease ([place]) 10/23/2015  . Prostate CA [facility] 2002   s/p  x-ray therapy and surgery  . Skin cancer   . Trigeminal neuralgia    h/o , status post gamma knife procedure and trial with multiple drugs    Past Surgical History:  Procedure Laterality Date  . CHOLECYSTECTOMY      There were no vitals filed for this visit.      Subjective Assessment - 03/29/17 1153    Subjective No changes since eval yesterday.  No questions from eval.   Patient is accompained by: Family member  wife   Patient Stated Goals To get more firm/steady standing and balance with walking; and to walk faster.   Currently in Pain? No/denies                         [facility] Adult PT Treatment/Exercise - 03/30/17 0001      Ambulation/Gait   Ambulation/Gait Yes   Ambulation Distance (Feet) 120 Feet  x 2, then 240 ft; 120 ft at end of session   Assistive device None  bilateral walking poles to facilitate arm swing   Gait Pattern Step-through pattern;Decreased arm swing - right;Decreased arm swing -  left;Decreased step length - right;Decreased step length - left;Narrow base of support;Poor foot clearance - left   Ambulation Surface Level;Indoor   Pre-Gait Activities Standing at counter:  forward step and weightshift x 10 reps, stagger stance forward/back weightshift x 10 reps, then forward/back walking along counter, 3 reps x 12 ft, with cues to increase step length in both directions   Gait Comments Utilized bilateral walking poles to facitliate arm swing, with PT behind patient, cues to patient to focus on increased step length.  Also provided cues to wife for "holding hands" to allow for improved arm swing with walking, versus locking arms with pt when walking.     Self-Care   Self-Care Other Self-Care Comments   Other Self-Care Comments  Provided cues/strategies to decrease hastening episodes with gait (particularly wide BOS weightshifting in standing, STOP to reset when hastening begins, being mindful of posture using visual cues).  Educated patient and wife in these cues.           PWR [facility]) - 03/29/17 1206    PWR! exercises Moves in standing   PWR! Up x 10   PWR! Rock x 10 reps each side   PWR! Twist x 8 reps each side   PWR Step x 8  reps each side   Comments Provided written instructions/picture handout, and demonstrated each exercise again with the HEP instructions to make sure wife has correct technique for HEP.  With exercise, PT provided verbal, visual, and occasional tactile cues for correct technique.             PT Education - 03/30/17 1048    Education provided Yes   Education Details HEP initiated for standing PWR! MOves, tips to reduce hastening episodes with gait   Person(s) Educated Patient;Spouse   Methods Explanation;Demonstration;Handout   Comprehension Verbalized understanding;Returned demonstration;Verbal cues required             PT Long Term Goals - 03/28/17 1458      PT LONG TERM GOAL #1   Title Pt will perform HEP with wife's  supervision, to address Parkinson's specific deficits.  TARGET 04/29/17   Time 5   Period Weeks   Status New   Target Date 04/29/17     PT LONG TERM GOAL #2   Title Pt will improve TUG score to less than or equal to 15 seconds for decreased fall risk.   Time 5   Period Weeks   Status New   Target Date 04/29/17     PT LONG TERM GOAL #3   Title Pt will improve DGI score to at least 18/24 for decreased fall risk.   Time 5   Period Weeks   Status New   Target Date 04/29/17     PT LONG TERM GOAL #4   Title Pt/wife will verbalize understanding of techniques to decrease episodes of hastening with gait.   Time 5   Period Weeks   Status New   Target Date 04/29/17     PT LONG TERM GOAL #5   Title Pt will ambulate at least 500 ft, indoor and outdoor surfaces, with supervision with appropriate assistive device, for improved community mobility.   Time 5   Period Weeks   Status New   Target Date 04/29/17     Additional Long Term Goals   Additional Long Term Goals Yes     PT LONG TERM GOAL #6   Title Pt/wife will verbalize understanding of fall prevention in home environment.   Time 5   Period Weeks   Status New   Target Date 04/29/17               Plan - 03/30/17 1049    Clinical Impression Statement Initiated HEP for standing PWR! MOves exercises this visit.  Pt able to perform with UE support and cues.  Also, addressed gait training with cues for increased step length and reciprocal arm swing.  Pt responds well to practice with bilateral walking poles and able to continue sequencing gait with reciprocal arm swing at least 50-75 ft.  No hastening noted in eval or in first treatment session, but based on wife's report of how patient fell, education provided on tips/strategies to reduce hastening with gait.  Pt will continue to benefit from skilled PT to address balance, posture, gait.   Rehab Potential Good   Clinical Impairments Affecting Rehab Potential Advanced age; wife  is present and appears motivated for helping patient with therapy exercises/recommendations   PT Frequency --  1x/wk for 1 week, then 2x/wk for 4 weeks   PT Duration Other (comment)  POC = 5 weeks   PT Treatment/Interventions ADLs/Self Care Home Management;Functional mobility training;Therapeutic activities;Therapeutic exercise;Balance training;Neuromuscular re-education;Patient/family education;Gait training;DME Instruction   PT Next Visit Plan REview  standign PWR! MOves, continue to work on gait training-heelstrike and step length; review cueing with wife   Consulted and Agree with Plan of Care Patient;Family member/caregiver   Family Member Consulted wife      Patient will benefit from skilled therapeutic intervention in order to improve the following deficits and impairments:  Abnormal gait, Decreased balance, Decreased mobility, Decreased strength, Difficulty walking, Postural dysfunction  Visit Diagnosis: Unsteadiness on feet  Other abnormalities of gait and mobility     Problem List Patient Active Problem List   Diagnosis Date Noted  . Sialorrhea 01/28/2016  . Memory disorder 01/28/2016  . Parkinson disease ([place]) 10/23/2015  . Gait disorder 02/24/2014  . Edema 09/28/2011  . Bronchospasm 08/18/2011  . Annual physical exam 03/31/2011  . RASH-NONVESICULAR 06/09/2010  . ANEMIA 12/30/2009  . DM 09/01/2009  . ALLERGIC RHINITIS 08/20/2009  . NEOPLASM, MALIGNANT, SKIN 08/30/2007  . ADENOCARCINOMA, PROSTATE, HX OF 03/21/2007  . HYPERLIPIDEMIA 09/29/2006  . GOUT 09/29/2006  . HIATAL HERNIA 09/29/2006    Pernell Dikes W. 03/30/2017, 10:53 AM  Frazier Butt., PT   [facility] [address] [place], [place], [address] Phone: [phone]   Fax:  [phone]  Name: [name] MRN: [id number] Date of Birth: 1918/10/15  Mady Haagensen, PT 04/06/17 11:01 AM Phone: [phone] Fax: [phone]

## 2017-04-06 ENCOUNTER — Ambulatory Visit: Payer: Medicare Other | Admitting: Physical Therapy

## 2017-04-06 DIAGNOSIS — R29818 Other symptoms and signs involving the nervous system: Secondary | ICD-10-CM | POA: Diagnosis not present

## 2017-04-06 DIAGNOSIS — R2681 Unsteadiness on feet: Secondary | ICD-10-CM | POA: Diagnosis not present

## 2017-04-06 DIAGNOSIS — R2689 Other abnormalities of gait and mobility: Secondary | ICD-10-CM | POA: Diagnosis not present

## 2017-04-07 NOTE — Therapy (Signed)
[facility] [address] [place], [place], [address] Phone: [phone]   Fax:  [phone]  Physical Therapy Treatment  Patient Details  Name: [name] MRN: [id number] Date of Birth: 05-Jun-1919 Referring Provider: Lenor Coffin  Encounter Date: 04/06/2017      PT End of Session - 04/07/17 1443    Visit Number 3   Number of Visits 10   Date for PT Re-Evaluation 05/27/17   Authorization Type Medicare primary, BCBS secondary-GCODE needed every 10th visit   PT Start Time 1107   PT Stop Time 1148   PT Time Calculation (min) 41 min   Activity Tolerance Patient tolerated treatment well   Behavior During Therapy [facility] for tasks assessed/performed      Past Medical History:  Diagnosis Date  . Allergy    rhinitis  . Diabetes mellitus    dx 3-11, A1C 6.7  . Gout   . Hiatal hernia   . Hyperlipidemia   . Parkinson disease ([place]) 10/23/2015  . Prostate CA [facility]) 2002   s/p  x-ray therapy and surgery  . Skin cancer   . Trigeminal neuralgia    h/o , status post gamma knife procedure and trial with multiple drugs    Past Surgical History:  Procedure Laterality Date  . CHOLECYSTECTOMY      There were no vitals filed for this visit.      Subjective Assessment - 04/06/17 1108    Subjective No pain, no falls reported since last visit.   Patient is accompained by: Family member  wife   Patient Stated Goals To get more firm/steady standing and balance with walking; and to walk faster.   Currently in Pain? No/denies                         [facility] Adult PT Treatment/Exercise - 04/06/17 1144      Ambulation/Gait   Ambulation/Gait Yes   Ambulation Distance (Feet) 480 Feet  x 2   Assistive device None  bilateral walking poles x 283f t   Gait Pattern Step-through pattern;Decreased arm swing - right;Decreased arm swing - left;Decreased step length - right;Decreased step length - left;Narrow  base of support;Poor foot clearance - left   Pre-Gait Activities Forward/back walking 20 ft each direction   Gait Comments Initially used bilateral walking poles to facilitated increased reciprocal arm swing.  With cues, pt able to increased step length; improved carryover noted with continued reciprocal arm swing with gait after walking poles taken away.     High Level Balance   High Level Balance Comments At counter:  used 2" block for side step weightshift to increase step height, x 10 reps each side, stagger stance forward/back weightshift x 10 reps each foot position, then forward/back step and weightshift x 10 reps each leg           PWR [facility]) - 04/06/17 1109    PWR! exercises Moves in standing   PWR! Up x 10   PWR! Rock x 10 reps each side  Cues for increased weightshift and reach   PWR! Twist x 10 reps each side  Cues for pivot to increase trunk rotation/clap   PWR Step x 10 reps each side  cues for increased height of step out and in   Comments For PWR! Moves in standing; pt needs extra cueing/extra reps prior to the full set, to get correct technique  PT Education - 04/07/17 1441    Education provided Yes   Education Details REview of HEP; prvoided additional cues for correct technique (offered PWR! Moves You Tube as resource for correct performance at home, but wife declines as she wants to help him with therapist's cues and instruction)   Person(s) Educated Patient;Spouse   Methods Explanation;Demonstration;Verbal cues   Comprehension Verbalized understanding;Returned demonstration;Verbal cues required;Need further instruction             PT Long Term Goals - 03/28/17 1458      PT LONG TERM GOAL #1   Title Pt will perform HEP with wife's supervision, to address Parkinson's specific deficits.  TARGET 04/29/17   Time 5   Period Weeks   Status New   Target Date 04/29/17     PT LONG TERM GOAL #2   Title Pt will improve TUG score to less than  or equal to 15 seconds for decreased fall risk.   Time 5   Period Weeks   Status New   Target Date 04/29/17     PT LONG TERM GOAL #3   Title Pt will improve DGI score to at least 18/24 for decreased fall risk.   Time 5   Period Weeks   Status New   Target Date 04/29/17     PT LONG TERM GOAL #4   Title Pt/wife will verbalize understanding of techniques to decrease episodes of hastening with gait.   Time 5   Period Weeks   Status New   Target Date 04/29/17     PT LONG TERM GOAL #5   Title Pt will ambulate at least 500 ft, indoor and outdoor surfaces, with supervision with appropriate assistive device, for improved community mobility.   Time 5   Period Weeks   Status New   Target Date 04/29/17     Additional Long Term Goals   Additional Long Term Goals Yes     PT LONG TERM GOAL #6   Title Pt/wife will verbalize understanding of fall prevention in home environment.   Time 5   Period Weeks   Status New   Target Date 04/29/17               Plan - 04/07/17 1443    Clinical Impression Statement Provided additional instruction and cues for purpose of and technique for PWR! Moves in standing (which was given as HEP last visit).  Worked on importance of weightshifting, stepping, trunk rotation and posture as part of balance, versus wife who helps patient with exercises seems to be more focused on SLS/yoga-type performance of stepping exercises.  Educated provided to both patient and wife for correct performance of HEP.  Pt seems to be improving with carryover of step length and arm swing with gait following cueing and use of walking poles to facilitate arm swing.   Rehab Potential Good   Clinical Impairments Affecting Rehab Potential Advanced age; wife is present and appears motivated for helping patient with therapy exercises/recommendations   PT Frequency --  1x/wk for 1 week, then 2x/wk for 4 weeks   PT Duration Other (comment)  POC = 5 weeks   PT Treatment/Interventions  ADLs/Self Care Home Management;Functional mobility training;Therapeutic activities;Therapeutic exercise;Balance training;Neuromuscular re-education;Patient/family education;Gait training;DME Instruction   PT Next Visit Plan Review standing PWR! MOves, continue to work on gait training; seated PWR! Moves versus additional balance exercises as part of HEP   Consulted and Agree with Plan of Care Patient;Family member/caregiver   Family Member  Consulted wife      Patient will benefit from skilled therapeutic intervention in order to improve the following deficits and impairments:  Abnormal gait, Decreased balance, Decreased mobility, Decreased strength, Difficulty walking, Postural dysfunction  Visit Diagnosis: Unsteadiness on feet  Other abnormalities of gait and mobility  Other symptoms and signs involving the nervous system     Problem List Patient Active Problem List   Diagnosis Date Noted  . Sialorrhea 01/28/2016  . Memory disorder 01/28/2016  . Parkinson disease ([place]) 10/23/2015  . Gait disorder 02/24/2014  . Edema 09/28/2011  . Bronchospasm 08/18/2011  . Annual physical exam 03/31/2011  . RASH-NONVESICULAR 06/09/2010  . ANEMIA 12/30/2009  . DM 09/01/2009  . ALLERGIC RHINITIS 08/20/2009  . NEOPLASM, MALIGNANT, SKIN 08/30/2007  . ADENOCARCINOMA, PROSTATE, HX OF 03/21/2007  . HYPERLIPIDEMIA 09/29/2006  . GOUT 09/29/2006  . HIATAL HERNIA 09/29/2006    Shiza Thelen W. 04/07/2017, 2:50 PM Frazier Butt., PT  [facility] [address] [place], [place], [address] Phone: [phone]   Fax:  [phone]  Name: [name] MRN: [id number] Date of Birth: 1918-12-10

## 2017-04-08 ENCOUNTER — Ambulatory Visit: Payer: Medicare Other | Admitting: Physical Therapy

## 2017-04-25 ENCOUNTER — Ambulatory Visit: Payer: Medicare Other | Attending: Neurology | Admitting: Physical Therapy

## 2017-04-25 DIAGNOSIS — R2689 Other abnormalities of gait and mobility: Secondary | ICD-10-CM | POA: Insufficient documentation

## 2017-04-25 DIAGNOSIS — R29818 Other symptoms and signs involving the nervous system: Secondary | ICD-10-CM | POA: Insufficient documentation

## 2017-04-25 DIAGNOSIS — R2681 Unsteadiness on feet: Secondary | ICD-10-CM | POA: Insufficient documentation

## 2017-04-28 ENCOUNTER — Ambulatory Visit: Payer: Medicare Other | Admitting: Physical Therapy

## 2017-04-28 ENCOUNTER — Encounter: Payer: Self-pay | Admitting: Physical Therapy

## 2017-04-28 DIAGNOSIS — R2689 Other abnormalities of gait and mobility: Secondary | ICD-10-CM

## 2017-04-28 DIAGNOSIS — R2681 Unsteadiness on feet: Secondary | ICD-10-CM | POA: Diagnosis not present

## 2017-04-28 DIAGNOSIS — R29818 Other symptoms and signs involving the nervous system: Secondary | ICD-10-CM

## 2017-04-29 NOTE — Therapy (Signed)
[facility] [address] [place], [place], [address] Phone: [phone]   Fax:  [phone]  Physical Therapy Treatment  Patient Details  Name: [name] MRN: [id number] Date of Birth: 1919/06/07 Referring Provider: Lenor Coffin   Encounter Date: 04/28/2017  PT End of Session - 04/29/17 0817    Visit Number  4    Number of Visits  10    Date for PT Re-Evaluation  05/27/17    Authorization Type  Medicare primary, BCBS secondary-GCODE needed every 10th visit    PT Start Time  1324    PT Stop Time  1410    PT Time Calculation (min)  46 min    Activity Tolerance  Patient tolerated treatment well    Behavior During Therapy  [facility] for tasks assessed/performed       Past Medical History:  Diagnosis Date  . Allergy    rhinitis  . Diabetes mellitus    dx 3-11, A1C 6.7  . Gout   . Hiatal hernia   . Hyperlipidemia   . Parkinson disease ([place]) 10/23/2015  . Prostate CA [facility]) 2002   s/p  x-ray therapy and surgery  . Skin cancer   . Trigeminal neuralgia    h/o , status post gamma knife procedure and trial with multiple drugs    Past Surgical History:  Procedure Laterality Date  . CHOLECYSTECTOMY      There were no vitals filed for this visit.  Subjective Assessment - 04/29/17 0809    Subjective  No changes.  Wife reports being out of town for several weeks at daughter's home, and she and daughter have been helping pt do exercises.    Patient is accompained by:  Family member wife    Patient Stated Goals  To get more firm/steady standing and balance with walking; and to walk faster.    Currently in Pain?  No/denies                      [facility] Adult PT Treatment/Exercise - 04/29/17 0001      Ambulation/Gait   Ambulation/Gait  Yes    Ambulation Distance (Feet)  600 Feet then 450 ft    Assistive device  None    Gait Pattern  Step-through pattern;Decreased arm swing - right;Decreased arm  swing - left;Decreased step length - right;Decreased step length - left;Narrow base of support;Poor foot clearance - left    Ambulation Surface  Level;Indoor    Gait Comments  Initially used walking poles to facilitate bilateral, reciprocal arm swing.  After removing walking poles, pt has slightly improved RUE arm swing.        High Level Balance   High Level Balance Activities  Side stepping;Backward walking    High Level Balance Comments  At counter:  used 2" block for side step weightshift to increase step height, x 10 reps each side, then forward step over 2" block x 10 reps each leg (for improved step height and length); step strategy work-forward step and weightshift x 10 reps, back step and weightshift x 10 reps, then forward/back step and weightshift x 10 reps each leg, with cues for increased posterior step length.       Standing at counter:  Heel/toe raises x 10 reps, 2 sets   PWR [facility]) - 04/28/17 1333    PWR! exercises  Moves in standing    PWR! Up  x 10    PWR! Rock   x  10 reps each side    PWR! Twist  x 10 reps each side    PWR Step   x10 rep each side    Comments  Extra reps for technique for increased amplitude, increased intensity of movement for PWR! Moves in standing               PT Long Term Goals - 04/29/17 0820      PT LONG TERM GOAL #1   Title  Pt will perform HEP with wife's supervision, to address Parkinson's specific deficits.  UPDATED TARGET 05/13/17    Time  5    Period  Weeks    Status  New    Target Date  05/13/17      PT LONG TERM GOAL #2   Title  Pt will improve TUG score to less than or equal to 15 seconds for decreased fall risk.    Time  5    Period  Weeks    Status  New    Target Date  05/13/17      PT LONG TERM GOAL #3   Title  Pt will improve DGI score to at least 18/24 for decreased fall risk.    Time  5    Period  Weeks    Status  New    Target Date  05/13/17      PT LONG TERM GOAL #4   Title  Pt/wife will verbalize  understanding of techniques to decrease episodes of hastening with gait.    Time  5    Period  Weeks    Status  New    Target Date  05/13/17      PT LONG TERM GOAL #5   Title  Pt will ambulate at least 500 ft, indoor and outdoor surfaces, with supervision with appropriate assistive device, for improved community mobility.    Time  5    Period  Weeks    Status  New    Target Date  05/13/17      PT LONG TERM GOAL #6   Title  Pt/wife will verbalize understanding of fall prevention in home environment.    Time  5    Period  Weeks    Status  New    Target Date  05/13/17            Plan - 04/29/17 0817    Clinical Impression Statement  Overall, pt seems to be performing PWR! Moves in standing well as part of HEP, with minimal cues for intensity of movement patterns.  Worked on additional balance activities for ankle strategy and step strategy, with pt needing multiple cueing for increased step length and height.  Pt has missed several weeks of therapy due to being out of town;  LTGS to be extended two additional weeks, as weeks remain in [place].    Rehab Potential  Good    Clinical Impairments Affecting Rehab Potential  Advanced age; wife is present and appears motivated for helping patient with therapy exercises/recommendations    PT Frequency  -- 1x/wk for 1 week, then 2x/wk for 4 weeks    PT Duration  Other (comment) POC = 5 weeks    PT Treatment/Interventions  ADLs/Self Care Home Management;Functional mobility training;Therapeutic activities;Therapeutic exercise;Balance training;Neuromuscular re-education;Patient/family education;Gait training;DME Instruction    PT Next Visit Plan  Add to HEP:  possibly heel/toe raises, step strategy forward/back; try corner balance exercises    Consulted and Agree with Plan of Care  Patient;Family member/caregiver  Family Member Consulted  wife       Patient will benefit from skilled therapeutic intervention in order to improve the following  deficits and impairments:  Abnormal gait, Decreased balance, Decreased mobility, Decreased strength, Difficulty walking, Postural dysfunction  Visit Diagnosis: Unsteadiness on feet  Other symptoms and signs involving the nervous system  Other abnormalities of gait and mobility     Problem List Patient Active Problem List   Diagnosis Date Noted  . Sialorrhea 01/28/2016  . Memory disorder 01/28/2016  . Parkinson disease ([place]) 10/23/2015  . Gait disorder 02/24/2014  . Edema 09/28/2011  . Bronchospasm 08/18/2011  . Annual physical exam 03/31/2011  . RASH-NONVESICULAR 06/09/2010  . ANEMIA 12/30/2009  . DM 09/01/2009  . ALLERGIC RHINITIS 08/20/2009  . NEOPLASM, MALIGNANT, SKIN 08/30/2007  . ADENOCARCINOMA, PROSTATE, HX OF 03/21/2007  . HYPERLIPIDEMIA 09/29/2006  . GOUT 09/29/2006  . HIATAL HERNIA 09/29/2006    Timothy Buckwalter W. 04/29/2017, 8:22 AM  Frazier Butt., PT  [facility] [address] [place], [place], [address] Phone: [phone]   Fax:  [phone]  Name: [name] MRN: [id number] Date of Birth: 04/29/1919

## 2017-05-02 ENCOUNTER — Encounter: Payer: Self-pay | Admitting: Physical Therapy

## 2017-05-02 ENCOUNTER — Ambulatory Visit: Payer: Medicare Other | Admitting: Physical Therapy

## 2017-05-02 DIAGNOSIS — R2681 Unsteadiness on feet: Secondary | ICD-10-CM | POA: Diagnosis not present

## 2017-05-02 DIAGNOSIS — R29818 Other symptoms and signs involving the nervous system: Secondary | ICD-10-CM | POA: Diagnosis not present

## 2017-05-02 DIAGNOSIS — R2689 Other abnormalities of gait and mobility: Secondary | ICD-10-CM | POA: Diagnosis not present

## 2017-05-02 NOTE — Therapy (Signed)
[facility] [address] [place], [place], [address] Phone: [phone]   Fax:  [phone]  Physical Therapy Treatment  Patient Details  Name: [name] MRN: [id number] Date of Birth: 02-03-1919 Referring Provider: Lenor Coffin   Encounter Date: 05/02/2017  PT End of Session - 05/02/17 1505    Visit Number  5    Number of Visits  10    Date for PT Re-Evaluation  05/27/17    Authorization Type  Medicare primary, BCBS secondary-GCODE needed every 10th visit    PT Start Time  1332 Pt arrives late due to traffic    PT Stop Time  1400    PT Time Calculation (min)  28 min    Activity Tolerance  Patient tolerated treatment well    Behavior During Therapy  [facility] for tasks assessed/performed       Past Medical History:  Diagnosis Date  . Allergy    rhinitis  . Diabetes mellitus    dx 3-11, A1C 6.7  . Gout   . Hiatal hernia   . Hyperlipidemia   . Parkinson disease ([place]) 10/23/2015  . Prostate CA [facility]) 2002   s/p  x-ray therapy and surgery  . Skin cancer   . Trigeminal neuralgia    h/o , status post gamma knife procedure and trial with multiple drugs    Past Surgical History:  Procedure Laterality Date  . CHOLECYSTECTOMY      There were no vitals filed for this visit.  Subjective Assessment - 05/02/17 1334    Subjective  No changes, no falls since last visit.      Patient is accompained by:  Family member wife    Patient Stated Goals  To get more firm/steady standing and balance with walking; and to walk faster.    Currently in Pain?  No/denies                      [facility] Adult PT Treatment/Exercise - 05/02/17 0001      High Level Balance   High Level Balance Comments  At counter:  forward step and weightshift x 10 reps, alternating legs, then back step and weightshift x 10 reps, same leg (both with UE support); Provided handout to include these exercises as HEP, and performed second  set of the above exercises with patient and wife looking on(with handouts) to ensure wife able to help patient perform HEP at home.        PWR [facility]) - 05/02/17 1336    PWR! exercises  Moves in standing    PWR! Up  x 10    PWR! Rock  x 10 reps each side    PWR! Twist  x 10 reps each side    PWR Step  x 10 reps each side    Comments  Reviewed PWR! Moves in standing, with pt return demo understanding, with min cues for technique and amplitude of movement.  Discussed with patient and wife the purpose behind each of the exercises-PWR! Up for posture, PWR! Rock for [place] and reaching, PWR! Twist for trunk flexibility, PWR! Step for step initiation and balance recovery.               PT Long Term Goals - 04/29/17 0820      PT LONG TERM GOAL #1   Title  Pt will perform HEP with wife's supervision, to address Parkinson's specific deficits.  UPDATED TARGET 05/13/17    Time  5    Period  Weeks    Status  New    Target Date  05/13/17      PT LONG TERM GOAL #2   Title  Pt will improve TUG score to less than or equal to 15 seconds for decreased fall risk.    Time  5    Period  Weeks    Status  New    Target Date  05/13/17      PT LONG TERM GOAL #3   Title  Pt will improve DGI score to at least 18/24 for decreased fall risk.    Time  5    Period  Weeks    Status  New    Target Date  05/13/17      PT LONG TERM GOAL #4   Title  Pt/wife will verbalize understanding of techniques to decrease episodes of hastening with gait.    Time  5    Period  Weeks    Status  New    Target Date  05/13/17      PT LONG TERM GOAL #5   Title  Pt will ambulate at least 500 ft, indoor and outdoor surfaces, with supervision with appropriate assistive device, for improved community mobility.    Time  5    Period  Weeks    Status  New    Target Date  05/13/17      PT LONG TERM GOAL #6   Title  Pt/wife will verbalize understanding of fall prevention in home environment.    Time  5     Period  Weeks    Status  New    Target Date  05/13/17            Plan - 05/02/17 1505    Clinical Impression Statement  Added additional step/weightshift activities to HEP this visit, to address step strategy with balance.  Upon leaving counter area to return to mat, pt has LOB with several people walking towards him, needing therapist to assist regaining balance.  Pt will continue to benefit from skilled PT to address balance and gait.    Rehab Potential  Good    Clinical Impairments Affecting Rehab Potential  Advanced age; wife is present and appears motivated for helping patient with therapy exercises/recommendations    PT Frequency  -- 1x/wk for 1 week, then 2x/wk for 4 weeks    PT Duration  Other (comment) POC = 5 weeks    PT Treatment/Interventions  ADLs/Self Care Home Management;Functional mobility training;Therapeutic activities;Therapeutic exercise;Balance training;Neuromuscular re-education;Patient/family education;Gait training;DME Instruction    PT Next Visit Plan  Review step strategy exercises added this visit-look at heel toe raises, compliant surface, varied direction stepping for balance recovery    Consulted and Agree with Plan of Care  Patient;Family member/caregiver    Family Member Consulted  wife       Patient will benefit from skilled therapeutic intervention in order to improve the following deficits and impairments:  Abnormal gait, Decreased balance, Decreased mobility, Decreased strength, Difficulty walking, Postural dysfunction  Visit Diagnosis: Unsteadiness on feet  Other symptoms and signs involving the nervous system     Problem List Patient Active Problem List   Diagnosis Date Noted  . Sialorrhea 01/28/2016  . Memory disorder 01/28/2016  . Parkinson disease ([place]) 10/23/2015  . Gait disorder 02/24/2014  . Edema 09/28/2011  . Bronchospasm 08/18/2011  . Annual physical exam 03/31/2011  . RASH-NONVESICULAR 06/09/2010  . ANEMIA 12/30/2009  . DM  09/01/2009  . ALLERGIC RHINITIS 08/20/2009  . NEOPLASM, MALIGNANT, SKIN 08/30/2007  . ADENOCARCINOMA, PROSTATE, HX OF 03/21/2007  . HYPERLIPIDEMIA 09/29/2006  . GOUT 09/29/2006  . HIATAL HERNIA 09/29/2006    Timothy Macapagal W. 05/02/2017, 3:08 PM  Frazier Butt., PT   [facility] [address] [place], [place], [address] Phone: [phone]   Fax:  [phone]  Name: Jaylyn Booher MRN: [id number] Date of Birth: September 28, 1918

## 2017-05-03 ENCOUNTER — Encounter: Payer: Self-pay | Admitting: Physical Therapy

## 2017-05-03 ENCOUNTER — Ambulatory Visit: Payer: Medicare Other | Admitting: Physical Therapy

## 2017-05-03 DIAGNOSIS — R2689 Other abnormalities of gait and mobility: Secondary | ICD-10-CM | POA: Diagnosis not present

## 2017-05-03 DIAGNOSIS — R2681 Unsteadiness on feet: Secondary | ICD-10-CM | POA: Diagnosis not present

## 2017-05-03 DIAGNOSIS — R29818 Other symptoms and signs involving the nervous system: Secondary | ICD-10-CM

## 2017-05-04 NOTE — Therapy (Signed)
[facility] [address] [place], [place], [address] Phone: [phone]   Fax:  [phone]  Physical Therapy Treatment  Patient Details  Name: [name] MRN: [id number] Date of Birth: 08/25/1918 Referring Provider: Lenor Coffin   Encounter Date: 05/03/2017  PT End of Session - 05/04/17 0841    Visit Number  6    Number of Visits  10    Date for PT Re-Evaluation  05/27/17    Authorization Type  Medicare primary, BCBS secondary-GCODE needed every 10th visit    PT Start Time  1318    PT Stop Time  1401    PT Time Calculation (min)  43 min    Activity Tolerance  Patient tolerated treatment well    Behavior During Therapy  [facility] for tasks assessed/performed       Past Medical History:  Diagnosis Date  . Allergy    rhinitis  . Diabetes mellitus    dx 3-11, A1C 6.7  . Gout   . Hiatal hernia   . Hyperlipidemia   . Parkinson disease ([place]) 10/23/2015  . Prostate CA [facility]) 2002   s/p  x-ray therapy and surgery  . Skin cancer   . Trigeminal neuralgia    h/o , status post gamma knife procedure and trial with multiple drugs    Past Surgical History:  Procedure Laterality Date  . CHOLECYSTECTOMY      There were no vitals filed for this visit.  Subjective Assessment - 05/03/17 1319    Subjective  Have not yet had a chance to do new exercises.    Patient is accompained by:  Family member wife    Patient Stated Goals  To get more firm/steady standing and balance with walking; and to walk faster.    Currently in Pain?  No/denies                      [facility] Adult PT Treatment/Exercise - 05/04/17 0001      Ambulation/Gait   Ambulation/Gait  Yes    Ambulation Distance (Feet)  230 Feet then 400    Assistive device  None used bilateral walking poles to facilitate arm swing    Gait Pattern  Step-through pattern;Decreased arm swing - right;Decreased step length - right;Decreased step length -  left;Narrow base of support;Poor foot clearance - right    Ambulation Surface  Level;Indoor    Gait Comments  Gait with head turns, head nods , with slowed pace of gait; gait with quick start/stops; gait with turns, and turning/quick stop.  Pt needs supervision with these activities due to slowed gait and slight veering to side.      High Level Balance   High Level Balance Activities  Side stepping;Backward walking 3-5 reps along counter    High Level Balance Comments  Reviewed forward step and weightshift and back step and weightshift exercises provided as HEP last visit.  Pt able to return demo with verbal and visual cues from therapist performing in front of him.            Balance Exercises - 05/04/17 0830      Balance Exercises: Standing   Stepping Strategy  Anterior;Posterior;Lateral;Foam/compliant surface;UE support;10 reps at counter    Balance Beam  Standing on foam beam:  heel/toe raises, marching in place x 10, forward kicks x 10, forward step taps x 10, back step taps x 10, forward/back step taps x 10 with cues for increased step length/height  for improved foot clearance    Heel Raises Limitations  x 20    Toe Raise Limitations  x 20        PT Education - 05/04/17 0841    Education provided  Yes    Education Details  Review of HEP    Person(s) Educated  Patient;Spouse    Methods  Explanation;Demonstration;Verbal cues    Comprehension  Verbalized understanding;Verbal cues required          PT Long Term Goals - 04/29/17 0820      PT LONG TERM GOAL #1   Title  Pt will perform HEP with wife's supervision, to address Parkinson's specific deficits.  UPDATED TARGET 05/13/17    Time  5    Period  Weeks    Status  New    Target Date  05/13/17      PT LONG TERM GOAL #2   Title  Pt will improve TUG score to less than or equal to 15 seconds for decreased fall risk.    Time  5    Period  Weeks    Status  New    Target Date  05/13/17      PT LONG TERM GOAL #3    Title  Pt will improve DGI score to at least 18/24 for decreased fall risk.    Time  5    Period  Weeks    Status  New    Target Date  05/13/17      PT LONG TERM GOAL #4   Title  Pt/wife will verbalize understanding of techniques to decrease episodes of hastening with gait.    Time  5    Period  Weeks    Status  New    Target Date  05/13/17      PT LONG TERM GOAL #5   Title  Pt will ambulate at least 500 ft, indoor and outdoor surfaces, with supervision with appropriate assistive device, for improved community mobility.    Time  5    Period  Weeks    Status  New    Target Date  05/13/17      PT LONG TERM GOAL #6   Title  Pt/wife will verbalize understanding of fall prevention in home environment.    Time  5    Period  Weeks    Status  New    Target Date  05/13/17            Plan - 05/04/17 6812    Clinical Impression Statement  Worked on balance activities today, including compliant surfaces and ankle/step strategies.  Also worked on dynamic gait, including head turns and quick turns/stopping.  Pt requires supervision due to slowed pace and slight veering to side with dynamic activities, but no overt LOB noted.  Pt will continue to benefit from skilled PT to address balance and gait.    Rehab Potential  Good    Clinical Impairments Affecting Rehab Potential  Advanced age; wife is present and appears motivated for helping patient with therapy exercises/recommendations    PT Frequency  -- 1x/wk for 1 week, then 2x/wk for 4 weeks    PT Duration  Other (comment) POC = 5 weeks    PT Treatment/Interventions  ADLs/Self Care Home Management;Functional mobility training;Therapeutic activities;Therapeutic exercise;Balance training;Neuromuscular re-education;Patient/family education;Gait training;DME Instruction    PT Next Visit Plan  Review HEP; discuss fall prevention and supervision with gait; check LTGs and discuss POC    Consulted and Agree with  Plan of Care  Patient;Family  member/caregiver    Family Member Consulted  wife       Patient will benefit from skilled therapeutic intervention in order to improve the following deficits and impairments:  Abnormal gait, Decreased balance, Decreased mobility, Decreased strength, Difficulty walking, Postural dysfunction  Visit Diagnosis: Unsteadiness on feet  Other symptoms and signs involving the nervous system  Other abnormalities of gait and mobility     Problem List Patient Active Problem List   Diagnosis Date Noted  . Sialorrhea 01/28/2016  . Memory disorder 01/28/2016  . Parkinson disease ([place]) 10/23/2015  . Gait disorder 02/24/2014  . Edema 09/28/2011  . Bronchospasm 08/18/2011  . Annual physical exam 03/31/2011  . RASH-NONVESICULAR 06/09/2010  . ANEMIA 12/30/2009  . DM 09/01/2009  . ALLERGIC RHINITIS 08/20/2009  . NEOPLASM, MALIGNANT, SKIN 08/30/2007  . ADENOCARCINOMA, PROSTATE, HX OF 03/21/2007  . HYPERLIPIDEMIA 09/29/2006  . GOUT 09/29/2006  . HIATAL HERNIA 09/29/2006    Briahnna Harries W. 05/04/2017, 8:45 AM  Frazier Butt., PT   [facility] [address] [place], [place], [address] Phone: [phone]   Fax:  [phone]  Name: Jacky Dross MRN: [id number] Date of Birth: 10/18/1918

## 2017-05-11 ENCOUNTER — Ambulatory Visit: Payer: Medicare Other | Admitting: Physical Therapy

## 2017-05-13 ENCOUNTER — Ambulatory Visit: Payer: Medicare Other | Admitting: Physical Therapy

## 2017-05-13 ENCOUNTER — Encounter: Payer: Self-pay | Admitting: Physical Therapy

## 2017-05-13 DIAGNOSIS — R29818 Other symptoms and signs involving the nervous system: Secondary | ICD-10-CM | POA: Diagnosis not present

## 2017-05-13 DIAGNOSIS — R2689 Other abnormalities of gait and mobility: Secondary | ICD-10-CM | POA: Diagnosis not present

## 2017-05-13 DIAGNOSIS — R2681 Unsteadiness on feet: Secondary | ICD-10-CM | POA: Diagnosis not present

## 2017-05-13 NOTE — Patient Instructions (Signed)

## 2017-05-13 NOTE — Therapy (Signed)
[facility] [address] [place], [place], [address] Phone: [phone]   Fax:  [phone]  Physical Therapy Treatment  Patient Details  Name: [name] MRN: [id number] Date of Birth: 09/30/1918 Referring Provider: Charles Keith Willis   Encounter Date: 05/13/2017  PT End of Session - 05/13/17 2124    Visit Number  7    Number of Visits  10 11    Date for PT Re-Evaluation  05/27/17    Authorization Type  Medicare primary, BCBS secondary-GCODE needed every 10th visit    PT Start Time  1150    PT Stop Time  1239    PT Time Calculation (min)  49 min    Activity Tolerance  Patient tolerated treatment well    Behavior During Therapy  [facility] for tasks assessed/performed       Past Medical History:  Diagnosis Date  . Allergy    rhinitis  . Diabetes mellitus    dx 3-11, A1C 6.7  . Gout   . Hiatal hernia   . Hyperlipidemia   . Parkinson disease ([facility]) 10/23/2015  . Prostate CA ([facility]) 2002   s/p  x-ray therapy and surgery  . Skin cancer   . Trigeminal neuralgia    h/o , status post gamma knife procedure and trial with multiple drugs    Past Surgical History:  Procedure Laterality Date  . CHOLECYSTECTOMY      There were no vitals filed for this visit.  Subjective Assessment - 05/13/17 2108    Subjective  No falls; per wife, doesn't do the exercises without my help    Patient is accompained by:  Family member wife    Patient Stated Goals  To get more firm/steady standing and balance with walking; and to walk faster.    Currently in Pain?  No/denies                      [facility] Adult PT Treatment/Exercise - 05/13/17 1204      Transfers   Transfers  Sit to Stand;Stand to Sit    Sit to Stand  6: Modified independent (Device/Increase time);Without upper extremity assist;From chair/3-in-1    Five time sit to stand comments   16.59    Stand to Sit  6: Modified independent (Device/Increase time)       Ambulation/Gait   Ambulation/Gait  Yes    Ambulation Distance (Feet)  345 Feet then 230    Assistive device  None    Gait Pattern  Step-through pattern;Decreased arm swing - right;Decreased step length - right;Narrow base of support;Poor foot clearance - right    Ambulation Surface  Level;Indoor    Gait velocity  10.5 (3.12 ft/sec)      Standardized Balance Assessment   Standardized Balance Assessment  Dynamic Gait Index;Timed Up and Go Test      Dynamic Gait Index   Level Surface  Mild Impairment    Change in Gait Speed  Normal    Gait with Horizontal Head Turns  Mild Impairment    Gait with Vertical Head Turns  Mild Impairment    Gait and Pivot Turn  Mild Impairment    Step Over Obstacle  Mild Impairment    Step Around Obstacles  Normal    Steps  Moderate Impairment    Total Score  17      Timed Up and Go Test   TUG  Normal TUG    Normal TUG (seconds)  16.82   17.41, 18.22        [facility]) - 05/13/17 2121    PWR! exercises  Moves in standing    PWR! Up  x 20    PWR! Rock  x 10 reps each side    PWR! Twist  x 10 reps each side    PWR Step  x 10 reps each side Forward step x 10, back step x 10 each side    Comments  Cues for intensity/amplitude of movement      Pt continues to need initial verbal and visual cues for all exercises, with continued reminders for technique and intensity.  Reviewed tips to reduce hastening with gait.   Discussed objective measures and compared to eval.    PT Education - 05/13/17 2122    Education provided  Yes    Education Details  Progress towards goals; discussed POC; fall prevention education    Person(s) Educated  Patient;Spouse    Methods  Explanation;Handout    Comprehension  Verbalized understanding          PT Long Term Goals - 05/13/17 1154      PT LONG TERM GOAL #1   Title  Pt will perform HEP with wife's supervision, to address Parkinson's specific deficits.  UPDATED TARGET 05/13/17    Time  5    Period  Weeks     Status  Achieved      PT LONG TERM GOAL #2   Title  Pt will improve TUG score to less than or equal to 15 seconds for decreased fall risk.    Baseline  17.41, 18.22, 16.82    Time  5    Period  Weeks    Status  Not Met      PT LONG TERM GOAL #3   Title  Pt will improve DGI score to at least 18/24 for decreased fall risk.    Time  5    Period  Weeks    Status  Not Met      PT LONG TERM GOAL #4   Title  Pt/wife will verbalize understanding of techniques to decrease episodes of hastening with gait.    Time  5    Period  Weeks    Status  Achieved      PT LONG TERM GOAL #5   Title  Pt will ambulate at least 500 ft, indoor and outdoor surfaces, with supervision with appropriate assistive device, for improved community mobility.  ONGOING/UPDATED TARGET 07/08/17    Time  5 4 weeks per recert 40/98/11    Period  Weeks    Status  On-going    Target Date  07/08/17      Additional Long Term Goals   Additional Long Term Goals  Yes      PT LONG TERM GOAL #6   Title  Pt/wife will verbalize understanding of fall prevention in home environment.  ONGOING/UPDATED TARGET 07/08/17    Time  5 4 weeks per recert 91/47/82    Period  Weeks    Status  On-going    Target Date  07/08/17      PT LONG TERM GOAL #7   Title  Pt will improve TUG score to less than or equal to 13.5 sec for decreased fall risk.  TARGET 07/08/17    Time  4    Period  Weeks    Status  New    Target Date  07/08/17      PT LONG TERM GOAL  #  9   TITLE  Pt will improve DGI score to at least 20/24 for decreased fall risk.  TARGET 07/08/17    Time  4    Period  Weeks    Status  New    Target Date  07/08/17            Plan - 05/13/17 2129    Clinical Impression Statement  Assessed LTGs this visit, with pt meeting LTG 1 and 4.  Pt improved on DGI and TUG scores, but did not meetin LTG 2 and 3.  Due to pt's missed/cancelled appointments, making progress, and still being at fall risk, pt would benefit from continued skilled  PT to addres posture, balance, and gait.  LTG 5-6 are going; LTG 7-8 new goals.      Rehab Potential  Good    Clinical Impairments Affecting Rehab Potential  Advanced age; wife is present and appears motivated for helping patient with therapy exercises/recommendations    PT Frequency  1x / week    PT Duration  4 weeks    PT Treatment/Interventions  ADLs/Self Care Home Management;Functional mobility training;Therapeutic activities;Therapeutic exercise;Balance training;Neuromuscular re-education;Patient/family education;Gait training;DME Instruction    PT Next Visit Plan  Review fall prevention; work on balance, posture, gait outdoor surfaces    Consulted and Agree with Plan of Care  Patient;Family member/caregiver    Family Member Consulted  wife       Patient will benefit from skilled therapeutic intervention in order to improve the following deficits and impairments:  Abnormal gait, Decreased balance, Decreased mobility, Decreased strength, Difficulty walking, Postural dysfunction  Visit Diagnosis: Other abnormalities of gait and mobility  Other symptoms and signs involving the nervous system  Unsteadiness on feet     Problem List Patient Active Problem List   Diagnosis Date Noted  . Sialorrhea 01/28/2016  . Memory disorder 01/28/2016  . Parkinson disease ([facility]) 10/23/2015  . Gait disorder 02/24/2014  . Edema 09/28/2011  . Bronchospasm 08/18/2011  . Annual physical exam 03/31/2011  . RASH-NONVESICULAR 06/09/2010  . ANEMIA 12/30/2009  . DM 09/01/2009  . ALLERGIC RHINITIS 08/20/2009  . NEOPLASM, MALIGNANT, SKIN 08/30/2007  . ADENOCARCINOMA, PROSTATE, HX OF 03/21/2007  . HYPERLIPIDEMIA 09/29/2006  . GOUT 09/29/2006  . HIATAL HERNIA 09/29/2006    MARRIOTT,AMY W. 05/13/2017, 9:33 PM MARRIOTT,AMY W., PT  [facility] [address] [place], [place], [address] Phone: [phone]   Fax:  [phone]  Name: [name] MRN: [id number] Date of Birth: 03/26/1919   

## 2017-05-26 ENCOUNTER — Ambulatory Visit: Payer: Medicare Other | Admitting: Physical Therapy

## 2017-06-02 ENCOUNTER — Encounter: Payer: Self-pay | Admitting: Physical Therapy

## 2017-06-02 ENCOUNTER — Ambulatory Visit: Payer: Medicare Other | Attending: Neurology | Admitting: Physical Therapy

## 2017-06-02 DIAGNOSIS — R29818 Other symptoms and signs involving the nervous system: Secondary | ICD-10-CM | POA: Diagnosis not present

## 2017-06-02 DIAGNOSIS — R2681 Unsteadiness on feet: Secondary | ICD-10-CM

## 2017-06-02 DIAGNOSIS — R2689 Other abnormalities of gait and mobility: Secondary | ICD-10-CM

## 2017-06-02 NOTE — Therapy (Signed)
[facility] [address] [place], [place], [address] Phone: [phone]   Fax:  [phone]  Physical Therapy Treatment  Patient Details  Name: [name] MRN: [id number] Date of Birth: 03/30/1919 Referring Provider: Lenor Coffin   Encounter Date: 06/02/2017  PT End of Session - 06/02/17 1141    Visit Number  8    Number of Visits  10 11    Date for PT Re-Evaluation  05/27/17    Authorization Type  Medicare primary, BCBS secondary-GCODE needed every 10th visit    PT Start Time  1016    PT Stop Time  1101    PT Time Calculation (min)  45 min    Activity Tolerance  Patient tolerated treatment well    Behavior During Therapy  [facility] for tasks assessed/performed       Past Medical History:  Diagnosis Date  . Allergy    rhinitis  . Diabetes mellitus    dx 3-11, A1C 6.7  . Gout   . Hiatal hernia   . Hyperlipidemia   . Parkinson disease ([place]) 10/23/2015  . Prostate CA [facility]) 2002   s/p  x-ray therapy and surgery  . Skin cancer   . Trigeminal neuralgia    h/o , status post gamma knife procedure and trial with multiple drugs    Past Surgical History:  Procedure Laterality Date  . CHOLECYSTECTOMY      There were no vitals filed for this visit.  Subjective Assessment - 06/02/17 1018    Subjective  No falls, no changes since last visit    Patient is accompained by:  Family member wife    Patient Stated Goals  To get more firm/steady standing and balance with walking; and to walk faster.    Currently in Pain?  No/denies                      [facility] Adult PT Treatment/Exercise - 06/02/17 1019      Transfers   Transfers  Sit to Stand;Stand to Sit    Sit to Stand  6: Modified independent (Device/Increase time);Without upper extremity assist;From chair/3-in-1    Stand to Sit  6: Modified independent (Device/Increase time)    Number of Reps  2 sets;10 reps cues for upright posture upon  standing      Ambulation/Gait   Ambulation/Gait  Yes    Ambulation Distance (Feet)  530 Feet then 400    Assistive device  None    Gait Pattern  Step-through pattern;Decreased arm swing - right;Decreased step length - right;Narrow base of support;Poor foot clearance - right    Ambulation Surface  Level;Indoor    Gait Comments  Gait with head turns, slowed pace of gait, 20 ft x 2 reps      High Level Balance   High Level Balance Activities  Side stepping;Backward walking;Marching forwards;Marching backwards Forward/back walking in parallel bars    High Level Balance Comments  Forward step and weightshift x 15 reps each leg with cues for widened BOS; back step and weightshift x 15 reps with cue for widened BOS.            Balance Exercises - 06/02/17 1127      Balance Exercises: Standing   Balance Beam  Standing on foam:  marching in place x 20 reps, forward step taps x 15 reps, then forward kicks x 15 reps; forward/back step and weightshift x 10 reps each side, with UE support  Heel Raises Limitations  x 20    Toe Raise Limitations  x 20 cues to slow pace, for 3 second hold    Other Standing Exercises  Alternating step taps x 10 reps each leg, then forward step up/up -down/down x 10 reps each leg leading with 1-2 UE support      Standing on foam balance beam:  Head turns x 10 reps, then head nods x 10 reps with UE support.       PT Long Term Goals - 05/13/17 1154      PT LONG TERM GOAL #1   Title  Pt will perform HEP with wife's supervision, to address Parkinson's specific deficits.  UPDATED TARGET 05/13/17    Time  5    Period  Weeks    Status  Achieved      PT LONG TERM GOAL #2   Title  Pt will improve TUG score to less than or equal to 15 seconds for decreased fall risk.    Baseline  17.41, 18.22, 16.82    Time  5    Period  Weeks    Status  Not Met      PT LONG TERM GOAL #3   Title  Pt will improve DGI score to at least 18/24 for decreased fall risk.    Time   5    Period  Weeks    Status  Not Met      PT LONG TERM GOAL #4   Title  Pt/wife will verbalize understanding of techniques to decrease episodes of hastening with gait.    Time  5    Period  Weeks    Status  Achieved      PT LONG TERM GOAL #5   Title  Pt will ambulate at least 500 ft, indoor and outdoor surfaces, with supervision with appropriate assistive device, for improved community mobility.  ONGOING/UPDATED TARGET 07/08/17    Time  5 4 weeks per recert 15/83/09    Period  Weeks    Status  On-going    Target Date  07/08/17      Additional Long Term Goals   Additional Long Term Goals  Yes      PT LONG TERM GOAL #6   Title  Pt/wife will verbalize understanding of fall prevention in home environment.  ONGOING/UPDATED TARGET 07/08/17    Time  5 4 weeks per recert 40/76/80    Period  Weeks    Status  On-going    Target Date  07/08/17      PT LONG TERM GOAL #7   Title  Pt will improve TUG score to less than or equal to 13.5 sec for decreased fall risk.  TARGET 07/08/17    Time  4    Period  Weeks    Status  New    Target Date  07/08/17      PT LONG TERM GOAL  #9   TITLE  Pt will improve DGI score to at least 20/24 for decreased fall risk.  TARGET 07/08/17    Time  4    Period  Weeks    Status  New    Target Date  07/08/17            Plan - 06/02/17 1142    Clinical Impression Statement  Skilled PT session focused on gait, dynamic balance activities for improved functional mobility.  Pt continues to slow gait pace with head turns, but overall no LOB noted.  With  balance activities, pt prefers 1-2 UE support.  Pt will continue to benefit from skilled PT to address balance, gait and posture.    Rehab Potential  Good    Clinical Impairments Affecting Rehab Potential  Advanced age; wife is present and appears motivated for helping patient with therapy exercises/recommendations    PT Frequency  1x / week    PT Duration  4 weeks    PT Treatment/Interventions  ADLs/Self Care  Home Management;Functional mobility training;Therapeutic activities;Therapeutic exercise;Balance training;Neuromuscular re-education;Patient/family education;Gait training;DME Instruction    PT Next Visit Plan  Continue to work on balance, posture, gait outdoor surfaces    Consulted and Agree with Plan of Care  Patient;Family member/caregiver    Family Member Consulted  wife       Patient will benefit from skilled therapeutic intervention in order to improve the following deficits and impairments:  Abnormal gait, Decreased balance, Decreased mobility, Decreased strength, Difficulty walking, Postural dysfunction  Visit Diagnosis: Other abnormalities of gait and mobility  Unsteadiness on feet  Other symptoms and signs involving the nervous system     Problem List Patient Active Problem List   Diagnosis Date Noted  . Sialorrhea 01/28/2016  . Memory disorder 01/28/2016  . Parkinson disease ([place]) 10/23/2015  . Gait disorder 02/24/2014  . Edema 09/28/2011  . Bronchospasm 08/18/2011  . Annual physical exam 03/31/2011  . RASH-NONVESICULAR 06/09/2010  . ANEMIA 12/30/2009  . DM 09/01/2009  . ALLERGIC RHINITIS 08/20/2009  . NEOPLASM, MALIGNANT, SKIN 08/30/2007  . ADENOCARCINOMA, PROSTATE, HX OF 03/21/2007  . HYPERLIPIDEMIA 09/29/2006  . GOUT 09/29/2006  . HIATAL HERNIA 09/29/2006    Yona Stansbury W. 06/02/2017, 11:45 AM  Frazier Butt., PT  [facility] [address] [place], [place], [address] Phone: [phone]   Fax:  [phone]  Name: [name] MRN: [id number] Date of Birth: [birth year]

## 2017-06-16 ENCOUNTER — Ambulatory Visit: Payer: Medicare Other | Admitting: Physical Therapy

## 2017-06-23 ENCOUNTER — Encounter: Payer: Self-pay | Admitting: Physical Therapy

## 2017-06-23 ENCOUNTER — Ambulatory Visit: Payer: Medicare Other | Attending: Neurology | Admitting: Physical Therapy

## 2017-06-23 ENCOUNTER — Telehealth: Payer: Self-pay | Admitting: Neurology

## 2017-06-23 DIAGNOSIS — R29818 Other symptoms and signs involving the nervous system: Secondary | ICD-10-CM | POA: Insufficient documentation

## 2017-06-23 DIAGNOSIS — R2681 Unsteadiness on feet: Secondary | ICD-10-CM

## 2017-06-23 DIAGNOSIS — R2689 Other abnormalities of gait and mobility: Secondary | ICD-10-CM | POA: Insufficient documentation

## 2017-06-23 MED ORDER — CARBIDOPA-LEVODOPA ER 25-100 MG PO TBCR: 1.0000 | EXTENDED_RELEASE_TABLET | Freq: Three times a day (TID) | ORAL | 5 refills | Status: DC

## 2017-06-23 NOTE — Telephone Encounter (Signed)
Patient and wife presented to lobby requesting refills of

## 2017-06-23 NOTE — Telephone Encounter (Signed)
Patient and wife presented to lobby requesting refill of Sinemet CR. Best call back [phone]

## 2017-06-23 NOTE — Telephone Encounter (Signed)
The Sinemet prescription will be refilled.  The patient will need a revisit.

## 2017-06-23 NOTE — Patient Instructions (Addendum)
Toe / Heel Raise    Gently rock back on heels and raise toes.  Hold for 3 seconds.  Then rock forward on toes and raise heels.  Hold for 3 seconds. Repeat sequence __20__ times per session. Do _1-2___ sessions per day.   Copyright  VHI. All rights reserved.  HIP / KNEE: Extension - Sit to Stand    Sitting at edge of chair with feet with wide base of support (at least shoulder width apart), lean chest forward, raise hips up from surface. Straighten hips and knees and stand up as tall as you can.  Weight bear equally on left and right sides. Backs of legs should not push off surface. _5-10__ reps per set, 1-2___ sets per day.  Copyright  VHI. All rights reserved.

## 2017-06-23 NOTE — Telephone Encounter (Signed)
Dr. Jannifer Franklin- okay to refill sinemet? He has no pending appt. When would you like him to come back?

## 2017-06-24 NOTE — Therapy (Signed)
[facility] [address] [place], [place], [address] Phone: [phone]   Fax:  [phone]  Physical Therapy Treatment  Patient Details  Name: [name] MRN: [id number] Date of Birth: Dec 21, 1918 Referring Provider: Lenor Coffin   Encounter Date: 06/23/2017  PT End of Session - 06/24/17 1641    Visit Number  9    Number of Visits  11 11    Date for PT Re-Evaluation  76/16/07 per recert 37/10/62    Authorization Type  Medicare primary, BCBS secondary-GCODE needed every 10th visit    PT Start Time  1404    PT Stop Time  1448    PT Time Calculation (min)  44 min    Activity Tolerance  Patient tolerated treatment well    Behavior During Therapy  [facility] for tasks assessed/performed       Past Medical History:  Diagnosis Date  . Allergy    rhinitis  . Diabetes mellitus    dx 3-11, A1C 6.7  . Gout   . Hiatal hernia   . Hyperlipidemia   . Parkinson disease ([place]) 10/23/2015  . Prostate CA [facility]) 2002   s/p  x-ray therapy and surgery  . Skin cancer   . Trigeminal neuralgia    h/o , status post gamma knife procedure and trial with multiple drugs    Past Surgical History:  Procedure Laterality Date  . CHOLECYSTECTOMY      There were no vitals filed for this visit.  Subjective Assessment - 06/23/17 1408    Subjective  No falls, no changes.  Wife reports misplacing HEP and requests new copy.    Patient is accompained by:  Family member wife    Patient Stated Goals  To get more firm/steady standing and balance with walking; and to walk faster.    Currently in Pain?  No/denies                         PWR [facility]) - 06/24/17 1634    PWR! exercises  Moves in standing    PWR! Up  x 20    PWR! Rock  x 10 reps each side    PWR! Twist  x 10 reps each side    PWR Step  x 10 reps each side forward x 10, back x 10     Comments  Overall, pt return demo good understanding of HEP, with cues for  intensity and techique.  Verbally instructed pt/wife in rationale behind stepping exercises is for balance recovery, with robust stepping the focus of these exercises (not necessarily arm placement)    PWR! Sit to Stand  x 10 reps with PWR! Up in standing; x 5 reps with lateral rocking, then x 5 reps with trunk rotation/head turns       Balance Exercises - 06/24/17 1635      Balance Exercises: Standing   Standing Eyes Opened  Wide (BOA);Head turns;5 reps head nods    Wall Bumps  Hip    Wall Bumps-Hips  Anterior/posterior;10 reps 2 sets    Heel Raises Limitations  x 20    Toe Raise Limitations  x 20     Other Standing Exercises  Standing at doorframe, postural exercises scapular squeezes x 10 reps for upright posture; standing wide BOS with lateral weightshifting, x 10 reps;  emphasized PWR! UP posture in standing with wide BOS and with upright posture for improved balance.  PT Education - 06/24/17 1640    Education provided  Yes    Education Details  Upright standing posture and wide BOS for best balance; updates to HEP    Person(s) Educated  Patient;Spouse    Methods  Explanation;Demonstration;Handout    Comprehension  Verbalized understanding;Returned demonstration;Verbal cues required          PT Long Term Goals - 05/13/17 1154      PT LONG TERM GOAL #1   Title  Pt will perform HEP with wife's supervision, to address Parkinson's specific deficits.  UPDATED TARGET 05/13/17    Time  5    Period  Weeks    Status  Achieved      PT LONG TERM GOAL #2   Title  Pt will improve TUG score to less than or equal to 15 seconds for decreased fall risk.    Baseline  17.41, 18.22, 16.82    Time  5    Period  Weeks    Status  Not Met      PT LONG TERM GOAL #3   Title  Pt will improve DGI score to at least 18/24 for decreased fall risk.    Time  5    Period  Weeks    Status  Not Met      PT LONG TERM GOAL #4   Title  Pt/wife will verbalize understanding of techniques to  decrease episodes of hastening with gait.    Time  5    Period  Weeks    Status  Achieved      PT LONG TERM GOAL #5   Title  Pt will ambulate at least 500 ft, indoor and outdoor surfaces, with supervision with appropriate assistive device, for improved community mobility.  ONGOING/UPDATED TARGET 07/08/17    Time  5 4 weeks per recert 55/73/22    Period  Weeks    Status  On-going    Target Date  07/08/17      Additional Long Term Goals   Additional Long Term Goals  Yes      PT LONG TERM GOAL #6   Title  Pt/wife will verbalize understanding of fall prevention in home environment.  ONGOING/UPDATED TARGET 07/08/17    Time  5 4 weeks per recert 02/54/27    Period  Weeks    Status  On-going    Target Date  07/08/17      PT LONG TERM GOAL #7   Title  Pt will improve TUG score to less than or equal to 13.5 sec for decreased fall risk.  TARGET 07/08/17    Time  4    Period  Weeks    Status  New    Target Date  07/08/17      PT LONG TERM GOAL  #9   TITLE  Pt will improve DGI score to at least 20/24 for decreased fall risk.  TARGET 07/08/17    Time  4    Period  Weeks    Status  New    Target Date  07/08/17            Plan - 06/24/17 1642    Clinical Impression Statement  Focused today on HEP review and focused cues for widened BOS, for upright posture in "ready" stance, as well as postural strategies for balance.  Pt appears to be performing HEP with wife'sassistance at home.  Pt will continue to beneift from continued balance and gait and posture work.  Rehab Potential  Good    Clinical Impairments Affecting Rehab Potential  Advanced age; wife is present and appears motivated for helping patient with therapy exercises/recommendations    PT Frequency  1x / week    PT Duration  4 weeks    PT Treatment/Interventions  ADLs/Self Care Home Management;Functional mobility training;Therapeutic activities;Therapeutic exercise;Balance training;Neuromuscular re-education;Patient/family  education;Gait training;DME Instruction    PT Next Visit Plan  Review HEP; balance, posture, gait on outdoor surfaces-begin discussion of d/c    Consulted and Agree with Plan of Care  Patient;Family member/caregiver    Family Member Consulted  wife       Patient will benefit from skilled therapeutic intervention in order to improve the following deficits and impairments:  Abnormal gait, Decreased balance, Decreased mobility, Decreased strength, Difficulty walking, Postural dysfunction  Visit Diagnosis: Unsteadiness on feet     Problem List Patient Active Problem List   Diagnosis Date Noted  . Sialorrhea 01/28/2016  . Memory disorder 01/28/2016  . Parkinson disease ([place]) 10/23/2015  . Gait disorder 02/24/2014  . Edema 09/28/2011  . Bronchospasm 08/18/2011  . Annual physical exam 03/31/2011  . RASH-NONVESICULAR 06/09/2010  . ANEMIA 12/30/2009  . DM 09/01/2009  . ALLERGIC RHINITIS 08/20/2009  . NEOPLASM, MALIGNANT, SKIN 08/30/2007  . ADENOCARCINOMA, PROSTATE, HX OF 03/21/2007  . HYPERLIPIDEMIA 09/29/2006  . GOUT 09/29/2006  . HIATAL HERNIA 09/29/2006    Newell Wafer W. 06/24/2017, 4:45 PM  Frazier Butt., PT   [facility] [address] [place], [place], [address] Phone: [phone]   Fax:  [phone]  Name: [name] MRN: [id number] Date of Birth: Oct 22, 1918

## 2017-06-24 NOTE — Telephone Encounter (Signed)
Called, LVM for pt letting him know CW,MD refilled his sinemet. He would like him to contact us to set up a follow up in the next 2-3 months.  Please schedule if pt/wife call back, thank you

## 2017-06-29 NOTE — Telephone Encounter (Signed)
Called and LVM again since no return call. Asked them to call back and make a f/u appt. Ok to schedule if they call.  Dr. Jannifer Franklin wanted him to follow up in the next 2-3 months

## 2017-06-30 ENCOUNTER — Ambulatory Visit: Payer: Medicare Other | Admitting: Physical Therapy

## 2017-06-30 ENCOUNTER — Encounter: Payer: Self-pay | Admitting: *Deleted

## 2017-06-30 DIAGNOSIS — R2681 Unsteadiness on feet: Secondary | ICD-10-CM

## 2017-06-30 DIAGNOSIS — R2689 Other abnormalities of gait and mobility: Secondary | ICD-10-CM

## 2017-06-30 NOTE — Telephone Encounter (Signed)
Sent pt letter to call and schedule f/u since unable to reach by phone.

## 2017-06-30 NOTE — Patient Instructions (Addendum)
Side to Side Head Motion    Perform without assistive device. Walking on solid surface, turn head and eyes to left for _3___ steps. Then, turn head and eyes straight ahead for _3___ steps. Then, turn head and eyes to opposite side for __3__ steps.  Do this along your hallway. Repeat sequence __3-5__ times per session. Do _1-2___ sessions per day.   Copyright  VHI. All rights reserved.

## 2017-07-01 ENCOUNTER — Encounter: Payer: Self-pay | Admitting: Physical Therapy

## 2017-07-01 NOTE — Therapy (Signed)
[facility] [address] [place], [place], [address] Phone: [phone]   Fax:  [phone]  Physical Therapy Treatment  Patient Details  Name: [name] MRN: [id number] Date of Birth: 1918-08-07 Referring Provider: Lenor Coffin   Encounter Date: 06/30/2017  PT End of Session - 07/01/17 1305    Visit Number  10    Number of Visits  11 11    Date for PT Re-Evaluation  75/44/92 per recert 01/00/71    Authorization Type  Medicare primary, BCBS secondary-GCODE needed every 10th visit    PT Start Time  1404    PT Stop Time  1448    PT Time Calculation (min)  44 min    Activity Tolerance  Patient tolerated treatment well    Behavior During Therapy  [facility] for tasks assessed/performed       Past Medical History:  Diagnosis Date  . Allergy    rhinitis  . Diabetes mellitus    dx 3-11, A1C 6.7  . Gout   . Hiatal hernia   . Hyperlipidemia   . Parkinson disease ([place]) 10/23/2015  . Prostate CA [facility]) 2002   s/p  x-ray therapy and surgery  . Skin cancer   . Trigeminal neuralgia    h/o , status post gamma knife procedure and trial with multiple drugs    Past Surgical History:  Procedure Laterality Date  . CHOLECYSTECTOMY      There were no vitals filed for this visit.  Subjective Assessment - 07/01/17 1256    Subjective  No falls, no changes; wife reports he is doing better with sit<>stand.  Patient reports more difficulty with head turns during PT session.    Patient is accompained by:  Family member wife    Patient Stated Goals  To get more firm/steady standing and balance with walking; and to walk faster.    Currently in Pain?  No/denies                      [facility] Adult PT Treatment/Exercise - 07/01/17 0001      Transfers   Transfers  Sit to Stand;Stand to Sit    Sit to Stand  6: Modified independent (Device/Increase time);Without upper extremity assist;From chair/3-in-1;From bed    Stand to Sit  6: Modified independent (Device/Increase time);Without upper extremity assist;To bed;To chair/3-in-1    Number of Reps  10 reps review of sit<>stand for HEP-pt demo understanding -Cues to stand with full upright posture each time with standing     Ambulation/Gait   Ambulation/Gait  Yes    Ambulation Distance (Feet)  800 Feet    Assistive device  None    Gait Pattern  Step-through pattern;Decreased arm swing - right;Decreased step length - right;Narrow base of support;Poor foot clearance - right    Ambulation Surface  Level;Indoor    Stairs  Yes    Stairs Assistance  5: Supervision;6: Modified independent (Device/Increase time)    Stairs Assistance Details (indicate cue type and reason)  Provided cues for definite use of handrail-instructed patient that he appears safe to try alternating step pattern to ascend stairs, but may need step to pattern to descend stairs for safety.  Pt apears to have good foot clearance for steps.    Stair Management Technique  One rail Left;Alternating pattern;Step to pattern;Forwards    Number of Stairs  4 x 5 reps    Height of Stairs  6    Ramp  5:  Supervision    Pre-Gait Activities  Simulated outdoor surfaces, with mat surfaces in gym area (due to rain outdoors today)    Gait Comments  Gait with head turns, with environmental scanning tasks, looking for objects, head turns during gait, head nods during gait.  Pt has one epsiode of LOB to L side, but otherwise, gait is just slowed with head turns/nods.      Self-Care   Self-Care  Other Self-Care Comments    Other Self-Care Comments   Discussed POC, including plans to check goals and plan for d/c next visit.  Discussed pt's current exercise routine, with wife's supervision, including walking laps in basement area.  Reviewed fall prevention education provided a previous visit-pt/wife verbalize understanding.          Balance Exercises - 06/30/17 1429      Balance Exercises: Standing   Standing  Eyes Opened  Wide (BOA);Head turns;5 reps;Narrow base of support (BOS) In corner, head nods    Gait with Head Turns  Forward;3 reps 20-30 ft in gym area with supervision    Heel Raises Limitations  x 20    Toe Raise Limitations  x 20 Cues provided to clarify to wife to avoid excess post lean        PT Education - 07/01/17 1304    Education provided  Yes    Education Details  Added walking with head turns to HEP; reviewed fall prevention    Person(s) Educated  Patient;Spouse    Methods  Explanation;Demonstration;Handout    Comprehension  Verbalized understanding;Returned demonstration;Verbal cues required          PT Long Term Goals - 05/13/17 1154      PT LONG TERM GOAL #1   Title  Pt will perform HEP with wife's supervision, to address Parkinson's specific deficits.  UPDATED TARGET 05/13/17    Time  5    Period  Weeks    Status  Achieved      PT LONG TERM GOAL #2   Title  Pt will improve TUG score to less than or equal to 15 seconds for decreased fall risk.    Baseline  17.41, 18.22, 16.82    Time  5    Period  Weeks    Status  Not Met      PT LONG TERM GOAL #3   Title  Pt will improve DGI score to at least 18/24 for decreased fall risk.    Time  5    Period  Weeks    Status  Not Met      PT LONG TERM GOAL #4   Title  Pt/wife will verbalize understanding of techniques to decrease episodes of hastening with gait.    Time  5    Period  Weeks    Status  Achieved      PT LONG TERM GOAL #5   Title  Pt will ambulate at least 500 ft, indoor and outdoor surfaces, with supervision with appropriate assistive device, for improved community mobility.  ONGOING/UPDATED TARGET 07/08/17    Time  5 4 weeks per recert 99/37/16    Period  Weeks    Status  On-going    Target Date  07/08/17      Additional Long Term Goals   Additional Long Term Goals  Yes      PT LONG TERM GOAL #6   Title  Pt/wife will verbalize understanding of fall prevention in home environment.   ONGOING/UPDATED TARGET 07/08/17  Time  5 4 weeks per recert 32/99/24    Period  Weeks    Status  On-going    Target Date  07/08/17      PT LONG TERM GOAL #7   Title  Pt will improve TUG score to less than or equal to 13.5 sec for decreased fall risk.  TARGET 07/08/17    Time  4    Period  Weeks    Status  New    Target Date  07/08/17      PT LONG TERM GOAL  #9   TITLE  Pt will improve DGI score to at least 20/24 for decreased fall risk.  TARGET 07/08/17    Time  4    Period  Weeks    Status  New    Target Date  07/08/17            Plan - 07/01/17 1306    Clinical Impression Statement  Reviewed new exercises added to HEP, with focus today on balance, and gait activities, including head turns.  Pt able to keep nice pace with gait, but does slow pace for head turns/nods with gait.  Discussed plans for checking LTGS to d/c next visit.    Rehab Potential  Good    Clinical Impairments Affecting Rehab Potential  Advanced age; wife is present and appears motivated for helping patient with therapy exercises/recommendations    PT Frequency  1x / week    PT Duration  4 weeks    PT Treatment/Interventions  ADLs/Self Care Home Management;Functional mobility training;Therapeutic activities;Therapeutic exercise;Balance training;Neuromuscular re-education;Patient/family education;Gait training;DME Instruction    PT Next Visit Plan  Plan to check LTGs and discharge next visit    Consulted and Agree with Plan of Care  Patient;Family member/caregiver    Family Member Consulted  wife       Patient will benefit from skilled therapeutic intervention in order to improve the following deficits and impairments:  Abnormal gait, Decreased balance, Decreased mobility, Decreased strength, Difficulty walking, Postural dysfunction  Visit Diagnosis: Other abnormalities of gait and mobility  Unsteadiness on feet     Problem List Patient Active Problem List   Diagnosis Date Noted  . Sialorrhea  01/28/2016  . Memory disorder 01/28/2016  . Parkinson disease ([place]) 10/23/2015  . Gait disorder 02/24/2014  . Edema 09/28/2011  . Bronchospasm 08/18/2011  . Annual physical exam 03/31/2011  . RASH-NONVESICULAR 06/09/2010  . ANEMIA 12/30/2009  . DM 09/01/2009  . ALLERGIC RHINITIS 08/20/2009  . NEOPLASM, MALIGNANT, SKIN 08/30/2007  . ADENOCARCINOMA, PROSTATE, HX OF 03/21/2007  . HYPERLIPIDEMIA 09/29/2006  . GOUT 09/29/2006  . HIATAL HERNIA 09/29/2006    Khayden Herzberg W. 07/01/2017, 1:13 PM  Frazier Butt., PT  [facility] [address] [place], [place], [address] Phone: [phone]   Fax:  [phone]  Name: [name] MRN: [id number] Date of Birth: 06-Oct-1918

## 2017-07-05 ENCOUNTER — Ambulatory Visit: Payer: Medicare Other | Admitting: Physical Therapy

## 2017-07-08 ENCOUNTER — Other Ambulatory Visit: Payer: Self-pay

## 2017-07-08 DIAGNOSIS — I739 Peripheral vascular disease, unspecified: Secondary | ICD-10-CM

## 2017-07-12 ENCOUNTER — Telehealth: Payer: Self-pay | Admitting: Neurology

## 2017-07-12 NOTE — Telephone Encounter (Signed)
Pt wife(on DPR) has called in response to letter received about appointment. Pt wife accepted 1st afternoon appointment avail for 08-15.  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  Pt wife is asking if re: pt's drooling condition pt be given an injection(with generally last a month) to prevent him from drooling while doing the presenting of his book in [place].  Pt wife is asking for a call back

## 2017-07-12 NOTE — Telephone Encounter (Signed)
I called the wife and left a message.  The patient will be going to [place] on 25 September 2016.  I will try to get him set up for a Botox injection for sialorrhea sometime in March.

## 2017-07-14 ENCOUNTER — Ambulatory Visit: Payer: Medicare Other | Admitting: Physical Therapy

## 2017-07-14 ENCOUNTER — Encounter: Payer: Self-pay | Admitting: Physical Therapy

## 2017-07-14 ENCOUNTER — Telehealth: Payer: Self-pay | Admitting: *Deleted

## 2017-07-14 DIAGNOSIS — R2689 Other abnormalities of gait and mobility: Secondary | ICD-10-CM

## 2017-07-14 DIAGNOSIS — R29818 Other symptoms and signs involving the nervous system: Secondary | ICD-10-CM

## 2017-07-14 DIAGNOSIS — R2681 Unsteadiness on feet: Secondary | ICD-10-CM

## 2017-07-14 NOTE — Therapy (Signed)
[facility] [address] [place], [place], [address] Phone: [phone]   Fax:  [phone]  Physical Therapy Treatment  Patient Details  Name: [name] MRN: [id number] Date of Birth: [birth year] Referring Provider: Lenor Coffin   Encounter Date: 07/14/2017  PT End of Session - 07/14/17 1833    Visit Number  11    Number of Visits  11 11    Date for PT Re-Evaluation  22/63/33 recert for 5/45/62 visit    Authorization Type  Medicare primary, BCBS secondary-GCODE needed every 10th visit    PT Start Time  1105    PT Stop Time  1147    PT Time Calculation (min)  42 min    Activity Tolerance  Patient tolerated treatment well    Behavior During Therapy  [facility] for tasks assessed/performed       Past Medical History:  Diagnosis Date  . Allergy    rhinitis  . Diabetes mellitus    dx 3-11, A1C 6.7  . Gout   . Hiatal hernia   . Hyperlipidemia   . Parkinson disease ([place]) 10/23/2015  . Prostate CA [facility]) 2002   s/p  x-ray therapy and surgery  . Skin cancer   . Trigeminal neuralgia    h/o , status post gamma knife procedure and trial with multiple drugs    Past Surgical History:  Procedure Laterality Date  . CHOLECYSTECTOMY      There were no vitals filed for this visit.  Subjective Assessment - 07/14/17 1109    Subjective  No changes, no falls.  Will be travelling to [place] in April.  Per wife report, pt has not done HEP due to "doing taxes and papers strowed all over."    Patient is accompained by:  Family member wife    Patient Stated Goals  To get more firm/steady standing and balance with walking; and to walk faster.    Currently in Pain?  No/denies                      [facility] Adult PT Treatment/Exercise - 07/14/17 0001      Ambulation/Gait   Ambulation/Gait  Yes    Ambulation Distance (Feet)  500 Feet then 200     Assistive device  None    Gait Pattern  Step-through  pattern;Decreased arm swing - right;Decreased step length - right;Narrow base of support;Poor foot clearance - right    Ambulation Surface  Level;Indoor    Gait velocity  11.38 sec = 2.88 ft/sec    Stairs  Yes    Stairs Assistance  5: Supervision;6: Modified independent (Device/Increase time)    Stair Management Technique  One rail Left;Alternating pattern;Step to pattern;Forwards    Number of Stairs  4 x 2    Height of Stairs  6    Gait Comments  Gait with head turns; with gait with head turns and conversation tasks, pt has slowed pace and difficulty with conversation tasks      Dynamic Gait Index   Level Surface  Normal    Change in Gait Speed  Normal    Gait with Horizontal Head Turns  Mild Impairment    Gait with Vertical Head Turns  Mild Impairment    Gait and Pivot Turn  Normal    Step Over Obstacle  Mild Impairment    Step Around Obstacles  Mild Impairment    Steps  Mild Impairment    Total Score  19      Timed Up and Go Test   TUG  Normal TUG    Normal TUG (seconds)  17.22 15.94, 16.06      High Level Balance   High Level Balance Comments  Review of pt's full HEP:  PWR! MOves in standing:  PWR! Up x 10, PWR! Rock x 10, [place]! Twist x 10, PWR! Step x 10, with additional reps for cueing for proper technique; forward step and weight shift, back step and weightshfit-pt needs consistent cues for correct performance of exercises.  Wife present for instruction and PT instructs wife in proper form.  Also, performed heel/toe raises,short distance gait with head turns and sit<>stand.            Self Care: PT Education - 07/14/17 1832    Education provided  Yes    Education Details  Discussed progress towards goals, benefits of continued HEP and walking performance at home; plans for discharge this visit; return PT screen in 6 months    Person(s) Educated  Patient;Spouse    Methods  Explanation;Demonstration    Comprehension  Verbalized understanding;Returned demonstration;Verbal  cues required          PT Long Term Goals - 07/14/17 1111      PT LONG TERM GOAL #1   Title  Pt will perform HEP with wife's supervision, to address Parkinson's specific deficits.  UPDATED TARGET 05/13/17    Time  5    Period  Weeks    Status  Achieved      PT LONG TERM GOAL #2   Title  Pt will improve TUG score to less than or equal to 15 seconds for decreased fall risk.    Baseline  17.41, 18.22, 16.82    Time  5    Period  Weeks    Status  Not Met      PT LONG TERM GOAL #3   Title  Pt will improve DGI score to at least 18/24 for decreased fall risk.    Time  5    Period  Weeks    Status  Not Met      PT LONG TERM GOAL #4   Title  Pt/wife will verbalize understanding of techniques to decrease episodes of hastening with gait.    Time  5    Period  Weeks    Status  Achieved      PT LONG TERM GOAL #5   Title  Pt will ambulate at least 500 ft, indoor and outdoor surfaces, with supervision with appropriate assistive device, for improved community mobility.  ONGOING/UPDATED TARGET 07/08/17    Baseline  500 ft indoors (temps too cold for outdoor gait)-with supervision    Time  5 4 weeks per recert 27/51/70    Period  Weeks    Status  Achieved      PT LONG TERM GOAL #6   Title  Pt/wife will verbalize understanding of fall prevention in home environment.  ONGOING/UPDATED TARGET 07/08/17    Time  5 4 weeks per recert 01/74/94    Period  Weeks    Status  Achieved      PT LONG TERM GOAL #7   Title  Pt will improve TUG score to less than or equal to 13.5 sec for decreased fall risk.  TARGET 07/08/17    Baseline  17.22, 15.94, 16.06 sec 07/14/17    Time  4    Period  Weeks    Status  Not  Met      PT LONG TERM GOAL  #9   TITLE  Pt will improve DGI score to at least 20/24 for decreased fall risk.  TARGET 07/08/17    Baseline  19/24 07/14/17    Time  4    Period  Weeks    Status  Not Met            Plan - 07/14/17 1834    Clinical Impression Statement  Assessed LTGs,  wtih LTG 5-6 met, LTG 7-8 not met.  (LTG 1-4 had previously been assessed).  Pt needs consistent cues for correct performance of HEP, which wife is able to provide, but PT did spend time educating wife on ways to make sure patient is safe performing exercises. (holding onto UE support of chair for improved step height and weigthshifting, for example.).  Pt appears appropriate for discharge from PT at this time.    Rehab Potential  Good    Clinical Impairments Affecting Rehab Potential  Advanced age; wife is present and appears motivated for helping patient with therapy exercises/recommendations    PT Frequency  1x / week recert for this visit 01/22/90    PT Duration  4 weeks    PT Treatment/Interventions  ADLs/Self Care Home Management;Functional mobility training;Therapeutic activities;Therapeutic exercise;Balance training;Neuromuscular re-education;Patient/family education;Gait training;DME Instruction    PT Next Visit Plan  Discharge this visit and plan for return screen in 6 months.    Consulted and Agree with Plan of Care  Patient;Family member/caregiver    Family Member Consulted  wife       Patient will benefit from skilled therapeutic intervention in order to improve the following deficits and impairments:  Abnormal gait, Decreased balance, Decreased mobility, Decreased strength, Difficulty walking, Postural dysfunction  Visit Diagnosis: Other abnormalities of gait and mobility  Unsteadiness on feet  Other symptoms and signs involving the nervous system     Problem List Patient Active Problem List   Diagnosis Date Noted  . Sialorrhea 01/28/2016  . Memory disorder 01/28/2016  . Parkinson disease ([place]) 10/23/2015  . Gait disorder 02/24/2014  . Edema 09/28/2011  . Bronchospasm 08/18/2011  . Annual physical exam 03/31/2011  . RASH-NONVESICULAR 06/09/2010  . ANEMIA 12/30/2009  . DM 09/01/2009  . ALLERGIC RHINITIS 08/20/2009  . NEOPLASM, MALIGNANT, SKIN 08/30/2007  .  ADENOCARCINOMA, PROSTATE, HX OF 03/21/2007  . HYPERLIPIDEMIA 09/29/2006  . GOUT 09/29/2006  . HIATAL HERNIA 09/29/2006    Daveon Arpino W. 07/14/2017, 6:38 PM  Frazier Butt., PT   [facility] [address] [place], [place], [address] Phone: [phone]   Fax:  [phone]  Name: Gerrald Basu MRN: [id number] Date of Birth: Dec 28, 1918   PHYSICAL THERAPY DISCHARGE SUMMARY  Visits from Start of Care: 11  Current functional level related to goals / functional outcomes: PT Long Term Goals - 07/14/17 1111      PT LONG TERM GOAL #1   Title  Pt will perform HEP with wife's supervision, to address Parkinson's specific deficits.  UPDATED TARGET 05/13/17    Time  5    Period  Weeks    Status  Achieved      PT LONG TERM GOAL #2   Title  Pt will improve TUG score to less than or equal to 15 seconds for decreased fall risk.    Baseline  17.41, 18.22, 16.82    Time  5    Period  Weeks    Status  Not Met  PT LONG TERM GOAL #3   Title  Pt will improve DGI score to at least 18/24 for decreased fall risk.    Time  5    Period  Weeks    Status  Not Met      PT LONG TERM GOAL #4   Title  Pt/wife will verbalize understanding of techniques to decrease episodes of hastening with gait.    Time  5    Period  Weeks    Status  Achieved      PT LONG TERM GOAL #5   Title  Pt will ambulate at least 500 ft, indoor and outdoor surfaces, with supervision with appropriate assistive device, for improved community mobility.  ONGOING/UPDATED TARGET 07/08/17    Baseline  500 ft indoors (temps too cold for outdoor gait)-with supervision    Time  5 4 weeks per recert 33/82/50    Period  Weeks    Status  Achieved      PT LONG TERM GOAL #6   Title  Pt/wife will verbalize understanding of fall prevention in home environment.  ONGOING/UPDATED TARGET 07/08/17    Time  5 4 weeks per recert 53/97/67    Period  Weeks    Status  Achieved       PT LONG TERM GOAL #7   Title  Pt will improve TUG score to less than or equal to 13.5 sec for decreased fall risk.  TARGET 07/08/17    Baseline  17.22, 15.94, 16.06 sec 07/14/17    Time  4    Period  Weeks    Status  Not Met      PT LONG TERM GOAL  #9   TITLE  Pt will improve DGI score to at least 20/24 for decreased fall risk.  TARGET 07/08/17    Baseline  19/24 07/14/17    Time  4    Period  Weeks    Status  Not Met         Remaining deficits: Bradykinesia, posture, gait with dual tasks (responds well to visual and verbal cues)   Education / Equipment: Educated patient and wife on HEP, fall prevention. Plan: Patient agrees to discharge.  Patient goals were partially met. Patient is being discharged due to being pleased with the current functional level.  ?????Pt may benefit from return screen in 6 months due to progressive nature of disease.    Mady Haagensen, PT 07/14/17 6:41 PM Phone: [phone] Fax: [phone]

## 2017-07-14 NOTE — Telephone Encounter (Signed)
Patients wife stopped by the office today.  Patient very interested in getting more injections in his salivary glands to prevent drooling. 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  Please call.

## 2017-07-14 NOTE — Telephone Encounter (Signed)
I have already requested that an appointment be set up for Botox therapy in March before he leaves in April.

## 2017-07-14 NOTE — Telephone Encounter (Signed)
I spoke with the patient's wife Consuelo in the lobby. Patient is currently getting therapy next door. Today was the last session. 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 She states he is very worried & concerned that he will be drooling. She stated it would be very embarrassing for the patient. Wife stated he would like to have Botox injections again before going on this trip. Her number for call back is [phone].

## 2017-07-18 NOTE — Telephone Encounter (Signed)
I spoke with the patients wife and scheduled his apt.

## 2017-07-18 NOTE — Telephone Encounter (Signed)
Pt's wife returned Danielle's call

## 2017-07-18 NOTE — Telephone Encounter (Signed)
Please refer to previous phone note, I have tried reaching patient to schedule injections with no response. I called him again today and left a VM asking them to call me back.

## 2017-07-18 NOTE — Telephone Encounter (Signed)
Spoke to patients wife and scheduled injection.

## 2017-08-05 ENCOUNTER — Ambulatory Visit (INDEPENDENT_AMBULATORY_CARE_PROVIDER_SITE_OTHER): Payer: Medicare Other | Admitting: Neurology

## 2017-08-05 ENCOUNTER — Encounter: Payer: Self-pay | Admitting: Neurology

## 2017-08-05 VITALS — BP 120/78 | HR 58 | Ht 64.0 in | Wt 162.0 lb

## 2017-08-05 DIAGNOSIS — K117 Disturbances of salivary secretion: Secondary | ICD-10-CM | POA: Diagnosis not present

## 2017-08-05 MED ORDER — ONABOTULINUMTOXINA 100 UNITS IJ SOLR
100.0000 [IU] | Freq: Once | INTRAMUSCULAR | Status: AC
  Administered 2017-08-05: 100 [IU] via INTRAMUSCULAR

## 2017-08-05 NOTE — Progress Notes (Signed)
Please refer to Botox procedure note. 

## 2017-08-05 NOTE — Procedures (Signed)
     History: [name] a 84 year old patient with a history of Parkinson's disease with associated sialorrhea. The drooling is quite a problem for him, he comes in for Botox injections for treatment of this issue.  Description of procedure: The patient was injected on both sides with Botox, 40 units in the parotid gland bilaterally and 10 units in the submandibular gland bilaterally.  The patient tolerated the procedure well, no complications were noted.  ACZ:[id number]  Lot number: [id number] Expiration date: August 2021

## 2017-08-25 ENCOUNTER — Encounter: Payer: Self-pay | Admitting: Vascular Surgery

## 2017-08-25 ENCOUNTER — Ambulatory Visit (INDEPENDENT_AMBULATORY_CARE_PROVIDER_SITE_OTHER): Payer: Medicare Other | Admitting: Vascular Surgery

## 2017-08-25 ENCOUNTER — Ambulatory Visit (HOSPITAL_COMMUNITY)
Admission: RE | Admit: 2017-08-25 | Discharge: 2017-08-25 | Disposition: A | Discharge status: Home / Self Care | Payer: Medicare Other | Source: Ambulatory Visit | Attending: Vascular Surgery | Admitting: Vascular Surgery

## 2017-08-25 VITALS — BP 128/72 | HR 53 | Temp 97.0°F | Resp 20 | Ht 64.0 in | Wt 162.2 lb

## 2017-08-25 DIAGNOSIS — M7989 Other specified soft tissue disorders: Secondary | ICD-10-CM

## 2017-08-25 DIAGNOSIS — I739 Peripheral vascular disease, unspecified: Secondary | ICD-10-CM | POA: Diagnosis not present

## 2017-08-25 NOTE — Progress Notes (Signed)
Referring Physician: Dr Jannifer Franklin  Patient name: [name] MRN: [id number] DOB: [birth year] Sex: male   REASON FOR CONSULT: duskiness of feet swelling and right knee pain  HPI: [name] is a 84 y.o. male with a 10-year history of duskiness of his feet with occasional swelling.  He does not really describe claudication or rest pain.  He has no nonhealing wounds.  He states that the knee pain is intermittent and not necessarily associated with walking or with sitting still.  He states that this has been going on for 10 years as far as the foot discoloration and swelling are concerned.  The right knee pain has been going on for about 2 years.  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  He primarily speaks [place].  His wife was present for the office visit today.  Other medical problems include diabetes, hyperlipidemia, Parkinson's disease all of which have been stable.  He is ambulatory with assistance.  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  He travels to present his books.  Past Medical History:  Diagnosis Date  . Allergy    rhinitis  . Diabetes mellitus    dx 3-11, A1C 6.7  . Gout   . Hiatal hernia   . Hyperlipidemia   . Parkinson disease ([place]) 10/23/2015  . Prostate CA [facility]) 2002   s/p  x-ray therapy and surgery  . Skin cancer   . Trigeminal neuralgia    h/o , status post gamma knife procedure and trial with multiple drugs   Past Surgical History:  Procedure Laterality Date  . CHOLECYSTECTOMY      Family History  Problem Relation Age of Onset  . Heart failure Mother   . Neuropathy Sister   . Diabetes Sister   . Kidney failure Sister   . Heart disease Brother   . Heart disease Sister   . Heart disease Brother   . Heart disease Brother   . Cancer Neg Hx        colon    SOCIAL HISTORY: Social History   Socioeconomic History  . Marital status: Married    Spouse name: Not on file  . Number of children: 2  . Years of education: PhD  . [facility]  education level: Not on file  Social Needs  . Financial resource strain: Not on file  . Food insecurity - worry: Not on file  . Food insecurity - inability: Not on file  . Transportation needs - medical: Not on file  . Transportation needs - non-medical: Not on file  Occupational History  . Occupation: Poet    Employer: RETIRED  Tobacco Use  . Smoking status: Former Research scientist (life sciences)  . Smokeless tobacco: Never Used  Substance and Sexual Activity  . Alcohol use: No    Comment: rare  . Drug use: No  . Sexual activity: Not on file  Other Topics Concern  . Not on file  Social History Narrative   From [place], Wife from [place]   Right-handed   Only occasional coffee          No Known Allergies  Current Outpatient Medications  Medication Sig Dispense Refill  . Ascorbic Acid (VITAMIN C) 1000 MG tablet Take 1,000 mg by mouth daily.    . Carbidopa-Levodopa ER (SINEMET CR) 25-100 MG tablet controlled release Take 1 tablet by mouth 3 (three) times daily. 90 tablet 5  . clotrimazole-betamethasone (LOTRISONE) cream apply to affected area twice a day topically 30 g 2  .  Multiple Vitamin (MULTIVITAMIN) tablet Take 1 tablet by mouth daily. Centrum silver    . simvastatin (ZOCOR) 40 MG tablet Take 1 tablet (40 mg total) by mouth at bedtime. (Patient taking differently: Take 40 mg by mouth daily at 8 pm. ) 30 tablet 0   No current facility-administered medications for this visit.     ROS:   General:  No weight loss, Fever, chills  HEENT: No recent headaches, no nasal bleeding, no visual changes, no sore throat  Neurologic: No dizziness, blackouts, seizures. No recent symptoms of stroke or mini- stroke. No recent episodes of slurred speech, or temporary blindness.  Cardiac: No recent episodes of chest pain/pressure, no shortness of breath at rest.  No shortness of breath with exertion.  Denies history of atrial fibrillation or irregular heartbeat  Vascular: No history of rest pain in feet.  No  history of claudication.  No history of non-healing ulcer, No history of DVT   Pulmonary: No home oxygen, no productive cough, no hemoptysis,  No asthma or wheezing  Musculoskeletal:  [X]  Arthritis, [ ]  Low back pain,  [X]  Joint pain  Hematologic:No history of hypercoagulable state.  No history of easy bleeding.  No history of anemia  Gastrointestinal: No hematochezia or melena,  No gastroesophageal reflux, no trouble swallowing  Urinary: [ ]  chronic Kidney disease, [ ]  on HD - [ ]  MWF or [ ]  TTHS, [ ]  Burning with urination, [ ]  Frequent urination, [ ]  Difficulty urinating;   Skin: No rashes  Psychological: No history of anxiety,  No history of depression   Physical Examination  Vitals:   08/25/17 1104 08/25/17 1109  BP: (!) 154/77 128/72  Pulse: (!) 53   Resp: 20   Temp: (!) 97 F (36.1 C)   TempSrc: Oral   SpO2: 98%   Weight: 162 lb 3.2 oz (73.6 kg)   Height: 5\' 4"  (1.626 m)     Body mass index is 27.84 kg/m.  General:  Alert and oriented, no acute distress HEENT: Normal Neck: No bruit or JVD Pulmonary: Clear to auscultation bilaterally Cardiac: Regular Rate and Rhythm without murmur Abdomen: Soft, non-tender, non-distended, no mass Skin: No rash, toes dusky bilaterally Extremity Pulses:  2+ radial, brachial, femoral, dorsalis pedis, absent posterior tibial pulses bilaterally Musculoskeletal: No deformity trace pedal and ankle edema  Neurologic: Upper and lower extremity motor 5/5 and symmetric  DATA:  Patient had bilateral ABIs performed today.  These were greater than 1 normal and triphasic bilaterally.  ASSESSMENT: Patient with intermittent chronic leg swelling which I do not believe is significant enough to consider compression therapy or further evaluation at this point.  His feet are dusky but he has normal ABIs and palpable pulses on exam.  I do not believe this is secondary to arterial occlusive disease.  He could have an element of central cardiac  dysfunction but does not really have symptoms of this.  This is been present for 10 years with no real change in symptoms.  As far as the right knee pain is concerned he has some degenerative changes with the right knee approximately 25% larger than the left but no evidence of effusion.  Most likely this represents degenerative joint disease.   PLAN: The patient will follow-up on an as-needed basis.  If his knee pain persists he could pursue further evaluation by orthopedic surgery.  If his swelling becomes more bothersome over time he may benefit from compression stockings.   Ruta Hinds, MD [facility] Office: [phone] Pager: [phone]

## 2018-01-26 ENCOUNTER — Ambulatory Visit: Payer: Medicare Other | Admitting: Neurology

## 2018-01-26 ENCOUNTER — Telehealth: Payer: Self-pay | Admitting: Neurology

## 2018-01-26 ENCOUNTER — Encounter: Payer: Self-pay | Admitting: Neurology

## 2018-01-26 VITALS — BP 152/78 | HR 51 | Ht 64.0 in | Wt 157.0 lb

## 2018-01-26 DIAGNOSIS — G20 Parkinson's disease: Secondary | ICD-10-CM

## 2018-01-26 MED ORDER — CARBIDOPA-LEVODOPA ER 25-100 MG PO TBCR: 1.5000 | EXTENDED_RELEASE_TABLET | Freq: Three times a day (TID) | ORAL | 5 refills | Status: DC

## 2018-01-26 NOTE — Telephone Encounter (Signed)
I called to schedule the patient but they did not answer so I left a VM asking them to call back.

## 2018-01-26 NOTE — Patient Instructions (Signed)
We will increase the Sinemet tablet 25/100 mg to 1.5 tablets three times a day.  May use Artificial Tears for the eyes.  Sinemet (carbidopa) may result in confusion or hallucinations, drowsiness, nausea, or dizziness. If any significant side effects are noted, please contact our office. Sinemet may not be well absorbed when taken with high protein meals, if tolerated it is best to take 30-45 minutes before you eat.

## 2018-01-26 NOTE — Progress Notes (Signed)
Reason for visit: Parkinson's disease  [name] is an 84 y.o. male  History of present illness:  [name] is an 84 year old right-handed Hispanic gentleman with a history of Parkinson's disease, diabetes, and sialorrhea.  The patient continues to have difficulty with excessive salivation, he did not get much benefit from the last Botox injection, but the prior injections have offered good benefit.  The patient has been walking on a regular basis, but his ability to walk longer distances has declined.  He has not had any falls.  He has some difficulty getting up out of a chair, he denies any significant issues with swallowing.  He returns to this office for an evaluation.  Past Medical History:  Diagnosis Date  . Allergy    rhinitis  . Diabetes mellitus    dx 3-11, A1C 6.7  . Gout   . Hiatal hernia   . Hyperlipidemia   . Parkinson disease ([place]) 10/23/2015  . Prostate CA [facility]) 2002   s/p  x-ray therapy and surgery  . Skin cancer   . Trigeminal neuralgia    h/o , status post gamma knife procedure and trial with multiple drugs    Past Surgical History:  Procedure Laterality Date  . CHOLECYSTECTOMY      Family History  Problem Relation Age of Onset  . Heart failure Mother   . Neuropathy Sister   . Diabetes Sister   . Kidney failure Sister   . Heart disease Brother   . Heart disease Sister   . Heart disease Brother   . Heart disease Brother   . Cancer Neg Hx        colon    Social history:  reports that he has quit smoking. He has never used smokeless tobacco. He reports that he does not drink alcohol or use drugs.   No Known Allergies  Medications:  Prior to Admission medications   Medication Sig Start Date End Date Taking? Authorizing Provider  Ascorbic Acid (VITAMIN C) 1000 MG tablet Take 1,000 mg by mouth daily.    [provider]  Carbidopa-Levodopa ER (SINEMET CR) 25-100 MG tablet controlled release Take 1.5 tablets by mouth 3 (three) times daily.  01/26/18   Kathrynn Ducking, MD  clotrimazole-betamethasone (LOTRISONE) cream apply to affected area twice a day topically Patient not taking: Reported on 01/26/2018 03/25/14   Colon Branch, MD  Multiple Vitamin (MULTIVITAMIN) tablet Take 1 tablet by mouth daily. Centrum silver    [provider]  simvastatin (ZOCOR) 40 MG tablet Take 1 tablet (40 mg total) by mouth at bedtime. Patient taking differently: Take 40 mg by mouth daily at 8 pm.  02/20/15   Colon Branch, MD    ROS:  Out of a complete 14 system review of symptoms, the patient complains only of the following symptoms, and all other reviewed systems are negative.  Hearing loss, drooling Eye redness Feet swelling Daytime sleepiness Frequency of urination Memory loss  Blood pressure (!) 152/78, pulse (!) 51, height 5\' 4"  (1.626 m), weight 157 lb (71.2 kg).  Physical Exam  General: The patient is alert and cooperative at the time of the examination.  Skin: 1-2+ edema below the knees is seen bilaterally.   Neurologic Exam  Mental status: The patient is alert and oriented x 3 at the time of the examination. The patient has apparent normal recent and remote memory, with an apparently normal attention span and concentration ability.   Cranial nerves: Facial symmetry  is present. Speech is normal, no aphasia or dysarthria is noted. Extraocular movements are full. Visual fields are full.  Masking of the face is seen.  Motor: The patient has good strength in all 4 extremities.  Sensory examination: Soft touch sensation is symmetric on the face, arms, and legs.  Coordination: The patient has good finger-nose-finger and heel-to-shin bilaterally.  Some apraxia with use of the extremities is noted.  Gait and station: The patient has the ability to ambulate independently.  The patient has some decreased arm swing on the right as compared to the left.  The patient has fairly good turns.  Romberg is negative.  Reflexes: Deep  tendon reflexes are symmetric.   Assessment/Plan:  1.  Parkinson's disease  2.  Sialorrhea  The patient will be set up for another Botox injection, the Sinemet will be increased to the 25/100 mg tablets taking 1.5 tablets 3 times daily, a prescription was sent in.  He will follow-up in 6 months.  Jill Alexanders MD 01/26/2018 2:55 PM  [facility] [address] [place], [place] [address]  Phone [phone] Fax [phone]

## 2018-03-07 ENCOUNTER — Ambulatory Visit: Payer: Medicare Other | Attending: Family Medicine | Admitting: Physical Therapy

## 2018-03-07 DIAGNOSIS — R2689 Other abnormalities of gait and mobility: Secondary | ICD-10-CM | POA: Insufficient documentation

## 2018-03-07 NOTE — Therapy (Signed)
[facility] [address] [place], [place], [address] Phone: [phone]   Fax:  [phone]  Patient Details  Name: [name] MRN: [id number] Date of Birth: [birth year] Referring Provider:  Vernie Shanks, MD  Encounter Date: 03/07/2018  Physical Therapy Parkinson's Disease Screen   Timed Up and Go test: 20.53 sec  10 meter walk test: 12.5 sec (2.62 ft/sec)-compared to 2.88 ft/sec  5 time sit to stand test: 13.91 sec    Patient does not require Physical Therapy services at this time.  Recommend Physical Therapy screen in 6-9 months.  Wife does report she is concerned about his excess salivation and inquires about speed therapy for potential swallowing/salivation issues.    Lourine Alberico W. 03/07/2018, 1:32 PM  Frazier Butt., PT   [facility] [address] [place], [place], [address] Phone: [phone]   Fax:  [phone]

## 2018-04-05 ENCOUNTER — Encounter (HOSPITAL_COMMUNITY): Payer: Self-pay | Admitting: Family Medicine

## 2018-04-05 ENCOUNTER — Emergency Department (HOSPITAL_COMMUNITY)
Admission: EM | Admit: 2018-04-05 | Discharge: 2018-04-05 | Disposition: A | Discharge status: Home / Self Care | Payer: Medicare Other | Attending: Emergency Medicine | Admitting: Emergency Medicine

## 2018-04-05 DIAGNOSIS — Z87891 Personal history of nicotine dependence: Secondary | ICD-10-CM | POA: Diagnosis not present

## 2018-04-05 DIAGNOSIS — R339 Retention of urine, unspecified: Secondary | ICD-10-CM | POA: Insufficient documentation

## 2018-04-05 DIAGNOSIS — G20 Parkinson's disease: Secondary | ICD-10-CM | POA: Diagnosis not present

## 2018-04-05 DIAGNOSIS — R319 Hematuria, unspecified: Secondary | ICD-10-CM | POA: Diagnosis present

## 2018-04-05 DIAGNOSIS — N3001 Acute cystitis with hematuria: Secondary | ICD-10-CM | POA: Insufficient documentation

## 2018-04-05 DIAGNOSIS — Z79899 Other long term (current) drug therapy: Secondary | ICD-10-CM | POA: Insufficient documentation

## 2018-04-05 DIAGNOSIS — E119 Type 2 diabetes mellitus without complications: Secondary | ICD-10-CM | POA: Diagnosis not present

## 2018-04-05 DIAGNOSIS — Z8546 Personal history of malignant neoplasm of prostate: Secondary | ICD-10-CM | POA: Diagnosis not present

## 2018-04-05 DIAGNOSIS — Z85828 Personal history of other malignant neoplasm of skin: Secondary | ICD-10-CM | POA: Diagnosis not present

## 2018-04-05 LAB — CBC
HEMATOCRIT: 37.8 % — AB (ref 39.0–52.0)
Hemoglobin: 12.1 g/dL — ABNORMAL LOW (ref 13.0–17.0)
MCH: 32.2 pg (ref 26.0–34.0)
MCHC: 32 g/dL (ref 30.0–36.0)
MCV: 100.5 fL — AB (ref 80.0–100.0)
NRBC: 0 % (ref 0.0–0.2)
PLATELETS: 154 10*3/uL (ref 150–400)
RBC: 3.76 MIL/uL — ABNORMAL LOW (ref 4.22–5.81)
RDW: 13.2 % (ref 11.5–15.5)
WBC: 6.9 10*3/uL (ref 4.0–10.5)

## 2018-04-05 LAB — BASIC METABOLIC PANEL
ANION GAP: 7 (ref 5–15)
BUN: 16 mg/dL (ref 8–23)
CALCIUM: 8.5 mg/dL — AB (ref 8.9–10.3)
CO2: 30 mmol/L (ref 22–32)
CREATININE: 0.97 mg/dL (ref 0.61–1.24)
Chloride: 102 mmol/L (ref 98–111)
GFR calc Af Amer: >60 mL/min (ref >60)
GLUCOSE: 103 mg/dL — AB (ref 70–99)
Potassium: 4 mmol/L (ref 3.5–5.1)
Sodium: 139 mmol/L (ref 135–145)

## 2018-04-05 LAB — URINALYSIS, ROUTINE W REFLEX MICROSCOPIC
BILIRUBIN URINE: NEGATIVE
Glucose, UA: 50 mg/dL — AB
KETONES UR: NEGATIVE mg/dL
LEUKOCYTES UA: NEGATIVE
NITRITE: NEGATIVE
Protein, ur: 100 mg/dL — AB
SPECIFIC GRAVITY, URINE: 1.004 — AB (ref 1.005–1.030)
pH: 9 — ABNORMAL HIGH (ref 5.0–8.0)

## 2018-04-05 MED ORDER — SODIUM CHLORIDE 0.9 % IV BOLUS
1000.0000 mL | Freq: Once | INTRAVENOUS | Status: AC
  Administered 2018-04-05: 1000 mL via INTRAVENOUS

## 2018-04-05 MED ORDER — CEPHALEXIN 500 MG PO CAPS: 500.0000 mg | ORAL_CAPSULE | Freq: Three times a day (TID) | ORAL | 0 refills | Status: DC

## 2018-04-05 MED ORDER — MORPHINE SULFATE (PF) 4 MG/ML IV SOLN
4.0000 mg | Freq: Once | INTRAVENOUS | Status: DC
  Filled 2018-04-05: qty 1

## 2018-04-05 MED ORDER — SODIUM CHLORIDE 0.9 % IV SOLN
1.0000 g | Freq: Once | INTRAVENOUS | Status: AC
  Administered 2018-04-05: 1 g via INTRAVENOUS
  Filled 2018-04-05: qty 10

## 2018-04-05 NOTE — ED Provider Notes (Signed)
[facility] DEPT Provider Note   CSN: [id number] Arrival date & time: 04/05/18  1712     History   Chief Complaint Chief Complaint  Patient presents with  . Hematuria    HPI [name] is a 84 y.o. male.  HPI Patient is a 84 year old male presents the emergency department with complaints of dysuria and urinary frequency and new hematuria today.  His symptoms began yesterday.  Wife reports that some blood clots occurred.  He has been able to urinate today.  He denies significant abdominal pain.  Symptoms are mild in severity.   Past Medical History:  Diagnosis Date  . Allergy    rhinitis  . Diabetes mellitus    dx 3-11, A1C 6.7  . Gout   . Hiatal hernia   . Hyperlipidemia   . Parkinson disease ([place]) 10/23/2015  . Prostate CA [facility]) 2002   s/p  x-ray therapy and surgery  . Skin cancer   . Trigeminal neuralgia    h/o , status post gamma knife procedure and trial with multiple drugs    Patient Active Problem List   Diagnosis Date Noted  . Sialorrhea 01/28/2016  . Memory disorder 01/28/2016  . Parkinson disease ([place]) 10/23/2015  . Gait disorder 02/24/2014  . Edema 09/28/2011  . Bronchospasm 08/18/2011  . Annual physical exam 03/31/2011  . RASH-NONVESICULAR 06/09/2010  . ANEMIA 12/30/2009  . DM 09/01/2009  . ALLERGIC RHINITIS 08/20/2009  . NEOPLASM, MALIGNANT, SKIN 08/30/2007  . ADENOCARCINOMA, PROSTATE, HX OF 03/21/2007  . HYPERLIPIDEMIA 09/29/2006  . GOUT 09/29/2006  . HIATAL HERNIA 09/29/2006    Past Surgical History:  Procedure Laterality Date  . CHOLECYSTECTOMY          Home Medications    Prior to Admission medications   Medication Sig Start Date End Date Taking? Authorizing Provider  Ascorbic Acid (VITAMIN C) 1000 MG tablet Take 1,000 mg by mouth daily.   Yes [provider]  Carbidopa-Levodopa ER (SINEMET CR) 25-100 MG tablet controlled release Take 1.5 tablets by mouth 3 (three) times daily. 01/26/18   Yes Kathrynn Ducking, MD  Multiple Vitamin (MULTIVITAMIN) tablet Take 1 tablet by mouth daily. Centrum silver   Yes [provider]  simvastatin (ZOCOR) 40 MG tablet Take 1 tablet (40 mg total) by mouth at bedtime. 02/20/15  Yes Paz, Alda Berthold, MD  cephALEXin (KEFLEX) 500 MG capsule Take 1 capsule (500 mg total) by mouth 3 (three) times daily. 04/05/18   Jola Schmidt, MD  clotrimazole-betamethasone (LOTRISONE) cream apply to affected area twice a day topically Patient not taking: Reported on 01/26/2018 03/25/14   Colon Branch, MD    Family History Family History  Problem Relation Age of Onset  . Heart failure Mother   . Neuropathy Sister   . Diabetes Sister   . Kidney failure Sister   . Heart disease Brother   . Heart disease Sister   . Heart disease Brother   . Heart disease Brother   . Cancer Neg Hx        colon    Social History Social History   Tobacco Use  . Smoking status: Former Research scientist (life sciences)  . Smokeless tobacco: Never Used  Substance Use Topics  . Alcohol use: No  . Drug use: No     Allergies   Patient has no known allergies.   Review of Systems Review of Systems  All other systems reviewed and are negative.    Physical Exam Updated Vital Signs BP [name] Kitchen)  229/107 (BP Location: Right Arm)   Pulse 100   Temp 98.2 F (36.8 C) (Oral)   Resp 16   Ht 5\' 7"  (1.702 m)   Wt 72.6 kg   SpO2 94%   BMI 25.06 kg/m   Physical Exam  Constitutional: He is oriented to person, place, and time. He appears well-developed and well-nourished.  HENT:  Head: Normocephalic.  Eyes: EOM are normal.  Neck: Normal range of motion.  Cardiovascular: Normal rate.  Pulmonary/Chest: Effort normal.  Abdominal: Soft. He exhibits no distension. There is no tenderness.  Genitourinary:  Genitourinary Comments: Uncircumcised penis.  Small amount of blood coming from the urethral meatus.  Musculoskeletal: Normal range of motion.  Neurological: He is alert and oriented to person, place,  and time.  Psychiatric: He has a normal mood and affect.  Nursing note and vitals reviewed.    ED Treatments / Results  Labs (all labs ordered are listed, but only abnormal results are displayed) Labs Reviewed  URINALYSIS, ROUTINE W REFLEX MICROSCOPIC - Abnormal; Notable for the following components:      Result Value   Color, Urine RED (*)    Specific Gravity, Urine 1.004 (*)    pH 9.0 (*)    Glucose, UA 50 (*)    Hgb urine dipstick MODERATE (*)    Protein, ur 100 (*)    Bacteria, UA MANY (*)    All other components within normal limits  CBC - Abnormal; Notable for the following components:   RBC 3.76 (*)    Hemoglobin 12.1 (*)    HCT 37.8 (*)    MCV 100.5 (*)    All other components within normal limits  BASIC METABOLIC PANEL - Abnormal; Notable for the following components:   Glucose, Bld 103 (*)    Calcium 8.5 (*)    All other components within normal limits  URINE CULTURE    EKG None  Radiology No results found.  Procedures Procedures (including critical care time)  Medications Ordered in ED Medications  morphine 4 MG/ML injection 4 mg (4 mg Intravenous Refused 04/05/18 2127)  sodium chloride 0.9 % bolus 1,000 mL (0 mLs Intravenous Stopped 04/05/18 2052)  cefTRIAXone (ROCEPHIN) 1 g in sodium chloride 0.9 % 100 mL IVPB (0 g Intravenous Stopped 04/05/18 2052)     Initial Impression / Assessment and Plan / ED Course  I have reviewed the triage vital signs and the nursing notes.  Pertinent labs & imaging results that were available during my care of the patient were reviewed by me and considered in my medical decision making (see chart for details).     Patient was able to urinate in the emergency department.  No significant urine noted on bladder scan.  Patient with symptoms consistent with acute cystitis now with hematuria.  I reviewed Rocephin and sent.  Urine culture sent.  Home with Keflex.  PCP and urology follow-up.  Patient family encouraged to return  to the ER for new or worsening symptoms  Final Clinical Impressions(s) / ED Diagnoses   Final diagnoses:  Acute cystitis with hematuria    ED Discharge Orders         Ordered    cephALEXin (KEFLEX) 500 MG capsule  3 times daily     04/05/18 2304           Jola Schmidt, MD 04/05/18 2340

## 2018-04-05 NOTE — ED Notes (Signed)
Pt assisted to a bedside commode for BM. Pt stated that he feels better after. Assisted back into bed

## 2018-04-05 NOTE — ED Triage Notes (Signed)
Patients first language is Spanish but knows some [place]. Patient preferred for his spouse to translate what he could not understand.

## 2018-04-05 NOTE — ED Notes (Signed)
MD notified of BP

## 2018-04-05 NOTE — ED Triage Notes (Signed)
Patients spouse reports he has been urinating blood with clots since yesterday. Patient states he has pain when he stands up in his penis. Denies nausea, vomiting, and fever.

## 2018-04-05 NOTE — ED Notes (Signed)
Pt stated that it only hurts when he stands up to pee but not if he is laying down

## 2018-04-05 NOTE — ED Notes (Addendum)
Morphine was opened but not punctured with a needle. Wasted in Chief Operating Officer with [place] witness per Pharmacists order

## 2018-04-07 LAB — URINE CULTURE

## 2018-07-31 ENCOUNTER — Other Ambulatory Visit: Payer: Self-pay | Admitting: Neurology

## 2018-08-16 ENCOUNTER — Ambulatory Visit: Payer: Medicare Other | Admitting: Neurology

## 2018-08-16 ENCOUNTER — Telehealth: Payer: Self-pay | Admitting: Neurology

## 2018-08-16 NOTE — Telephone Encounter (Signed)
This patient did not show for a revisit appointment today. 

## 2018-08-17 ENCOUNTER — Encounter: Payer: Self-pay | Admitting: Neurology

## 2018-09-25 ENCOUNTER — Telehealth: Payer: Self-pay | Admitting: Occupational Therapy

## 2018-09-25 NOTE — Telephone Encounter (Signed)
[name] was contacted today regarding the temporary closing of [facility] due to [place].  Therapist discussed:canellation of upcoming PD screens  [facility] will follow up with patients when we are able to resume care.  [name], OTR/L [phone] Phone: [phone] 4:59 PM 09/25/18 [facility] [address] [place], [place]  [address] Phone:  [phone] Fax:  [phone]

## 2018-11-07 ENCOUNTER — Ambulatory Visit: Payer: Medicare Other | Admitting: Physical Therapy

## 2019-01-24 ENCOUNTER — Other Ambulatory Visit: Payer: Self-pay | Admitting: Neurology

## 2019-02-22 ENCOUNTER — Other Ambulatory Visit: Payer: Self-pay | Admitting: Neurology

## 2019-07-15 ENCOUNTER — Encounter (HOSPITAL_COMMUNITY): Payer: Self-pay | Admitting: Emergency Medicine

## 2019-07-15 ENCOUNTER — Other Ambulatory Visit: Payer: Self-pay

## 2019-07-15 ENCOUNTER — Emergency Department (HOSPITAL_COMMUNITY)
Admission: EM | Admit: 2019-07-15 | Discharge: 2019-07-15 | Disposition: A | Discharge status: Home / Self Care | Payer: Medicare PPO | Attending: Emergency Medicine | Admitting: Emergency Medicine

## 2019-07-15 DIAGNOSIS — Z87891 Personal history of nicotine dependence: Secondary | ICD-10-CM | POA: Diagnosis not present

## 2019-07-15 DIAGNOSIS — N3091 Cystitis, unspecified with hematuria: Secondary | ICD-10-CM | POA: Diagnosis not present

## 2019-07-15 DIAGNOSIS — E119 Type 2 diabetes mellitus without complications: Secondary | ICD-10-CM | POA: Insufficient documentation

## 2019-07-15 DIAGNOSIS — Z8546 Personal history of malignant neoplasm of prostate: Secondary | ICD-10-CM | POA: Diagnosis not present

## 2019-07-15 DIAGNOSIS — Z79899 Other long term (current) drug therapy: Secondary | ICD-10-CM | POA: Diagnosis not present

## 2019-07-15 DIAGNOSIS — R310 Gross hematuria: Secondary | ICD-10-CM

## 2019-07-15 DIAGNOSIS — N3090 Cystitis, unspecified without hematuria: Secondary | ICD-10-CM

## 2019-07-15 DIAGNOSIS — R319 Hematuria, unspecified: Secondary | ICD-10-CM | POA: Diagnosis present

## 2019-07-15 LAB — BASIC METABOLIC PANEL
Anion gap: 11 (ref 5–15)
BUN: 26 mg/dL — ABNORMAL HIGH (ref 8–23)
CO2: 27 mmol/L (ref 22–32)
Calcium: 8.9 mg/dL (ref 8.9–10.3)
Chloride: 98 mmol/L (ref 98–111)
Creatinine, Ser: 1.4 mg/dL — ABNORMAL HIGH (ref 0.61–1.24)
GFR calc Af Amer: 48 mL/min — ABNORMAL LOW (ref >60)
GFR calc non Af Amer: 42 mL/min — ABNORMAL LOW (ref >60)
Glucose, Bld: 125 mg/dL — ABNORMAL HIGH (ref 70–99)
Potassium: 4.2 mmol/L (ref 3.5–5.1)
Sodium: 136 mmol/L (ref 135–145)

## 2019-07-15 LAB — CBC WITH DIFFERENTIAL/PLATELET
Abs Immature Granulocytes: 0.05 10*3/uL (ref 0.00–0.07)
Basophils Absolute: 0 10*3/uL (ref 0.0–0.1)
Basophils Relative: 0 %
Eosinophils Absolute: 0 10*3/uL (ref 0.0–0.5)
Eosinophils Relative: 0 %
HCT: 38.6 % — ABNORMAL LOW (ref 39.0–52.0)
Hemoglobin: 12.9 g/dL — ABNORMAL LOW (ref 13.0–17.0)
Immature Granulocytes: 1 %
Lymphocytes Relative: 5 %
Lymphs Abs: 0.6 10*3/uL — ABNORMAL LOW (ref 0.7–4.0)
MCH: 32.5 pg (ref 26.0–34.0)
MCHC: 33.4 g/dL (ref 30.0–36.0)
MCV: 97.2 fL (ref 80.0–100.0)
Monocytes Absolute: 0.8 10*3/uL (ref 0.1–1.0)
Monocytes Relative: 8 %
Neutro Abs: 9.5 10*3/uL — ABNORMAL HIGH (ref 1.7–7.7)
Neutrophils Relative %: 86 %
Platelets: 138 10*3/uL — ABNORMAL LOW (ref 150–400)
RBC: 3.97 MIL/uL — ABNORMAL LOW (ref 4.22–5.81)
RDW: 12.8 % (ref 11.5–15.5)
WBC: 10.9 10*3/uL — ABNORMAL HIGH (ref 4.0–10.5)
nRBC: 0 % (ref 0.0–0.2)

## 2019-07-15 LAB — HEPATIC FUNCTION PANEL
ALT: 23 U/L (ref 0–44)
AST: 33 U/L (ref 15–41)
Albumin: 3.9 g/dL (ref 3.5–5.0)
Alkaline Phosphatase: 71 U/L (ref 38–126)
Bilirubin, Direct: 0.2 mg/dL (ref 0.0–0.2)
Indirect Bilirubin: 0.8 mg/dL (ref 0.3–0.9)
Total Bilirubin: 1 mg/dL (ref 0.3–1.2)
Total Protein: 7.6 g/dL (ref 6.5–8.1)

## 2019-07-15 LAB — URINALYSIS, ROUTINE W REFLEX MICROSCOPIC

## 2019-07-15 LAB — URINALYSIS, MICROSCOPIC (REFLEX)
Bacteria, UA: NONE SEEN
RBC / HPF: >50 RBC/hpf (ref 0–5)
Squamous Epithelial / LPF: NONE SEEN (ref 0–5)

## 2019-07-15 LAB — LIPASE, BLOOD: Lipase: 18 U/L (ref 11–51)

## 2019-07-15 MED ORDER — SODIUM CHLORIDE 0.9 % IV BOLUS
1000.0000 mL | Freq: Once | INTRAVENOUS | Status: AC
  Administered 2019-07-15: 1000 mL via INTRAVENOUS

## 2019-07-15 MED ORDER — SODIUM CHLORIDE 0.9 % IV BOLUS
250.0000 mL | Freq: Once | INTRAVENOUS | Status: AC
  Administered 2019-07-15: 250 mL via INTRAVENOUS

## 2019-07-15 MED ORDER — CEPHALEXIN 500 MG PO CAPS: 500.0000 mg | ORAL_CAPSULE | Freq: Two times a day (BID) | ORAL | 0 refills | Status: AC

## 2019-07-15 MED ORDER — SODIUM CHLORIDE 0.9 % IV SOLN
1.0000 g | Freq: Once | INTRAVENOUS | Status: AC
  Administered 2019-07-15: 1 g via INTRAVENOUS
  Filled 2019-07-15: qty 10

## 2019-07-15 NOTE — ED Triage Notes (Signed)
Patient here from [facility] with complaints of large amount of blood coming from penis and chills. Prostate CA.

## 2019-07-15 NOTE — Discharge Instructions (Signed)
Return to the emergency department if decreased urinary output from catheter.  Take antibiotic as prescribed.  Please call urologist for appointment this week.

## 2019-07-15 NOTE — ED Provider Notes (Signed)
[facility] DEPT Provider Note   CSN: [id number] Arrival date & time: 07/15/19  1150     History Chief Complaint  Patient presents with  . Hematuria    [name] is a 86 y.o. male.  Patient with history of Parkinson disease, prostate cancer who presents to the ED with hematuria and pain with urination.  Patient denies any abdominal pain.  Denies any urinary retention.  Has had blood grossly in his urine since yesterday afternoon.  History of the same in the past when he has urinary tract infections.  Has required a catheter in the past.  No smoking history.  No bladder cancer history.  The history is provided by the patient and a caregiver.  Hematuria This is a new problem. The current episode started yesterday. The problem occurs constantly. The problem has not changed since onset.Pertinent negatives include no chest pain, no abdominal pain, no headaches and no shortness of breath. Nothing aggravates the symptoms. Nothing relieves the symptoms. He has tried nothing for the symptoms. The treatment provided no relief.       Past Medical History:  Diagnosis Date  . Allergy    rhinitis  . Diabetes mellitus    dx 3-11, A1C 6.7  . Gout   . Hiatal hernia   . Hyperlipidemia   . Parkinson disease ([place]) 10/23/2015  . Prostate CA [facility]) 2002   s/p  x-ray therapy and surgery  . Skin cancer   . Trigeminal neuralgia    h/o , status post gamma knife procedure and trial with multiple drugs    Patient Active Problem List   Diagnosis Date Noted  . Sialorrhea 01/28/2016  . Memory disorder 01/28/2016  . Parkinson disease ([place]) 10/23/2015  . Gait disorder 02/24/2014  . Edema 09/28/2011  . Bronchospasm 08/18/2011  . Annual physical exam 03/31/2011  . RASH-NONVESICULAR 06/09/2010  . ANEMIA 12/30/2009  . DM 09/01/2009  . ALLERGIC RHINITIS 08/20/2009  . NEOPLASM, MALIGNANT, SKIN 08/30/2007  . ADENOCARCINOMA, PROSTATE, HX OF 03/21/2007  . HYPERLIPIDEMIA  09/29/2006  . GOUT 09/29/2006  . HIATAL HERNIA 09/29/2006    Past Surgical History:  Procedure Laterality Date  . CHOLECYSTECTOMY         Family History  Problem Relation Age of Onset  . Heart failure Mother   . Neuropathy Sister   . Diabetes Sister   . Kidney failure Sister   . Heart disease Brother   . Heart disease Sister   . Heart disease Brother   . Heart disease Brother   . Cancer Neg Hx        colon    Social History   Tobacco Use  . Smoking status: Former Research scientist (life sciences)  . Smokeless tobacco: Never Used  Substance Use Topics  . Alcohol use: No  . Drug use: No    Home Medications Prior to Admission medications   Medication Sig Start Date End Date Taking? Authorizing Provider  Ascorbic Acid (VITAMIN C) 1000 MG tablet Take 1,000 mg by mouth daily.   Yes [provider]  Carbidopa-Levodopa ER (SINEMET CR) 25-100 MG tablet controlled release TAKE 1 AND 1/2 TABLETS BY MOUTH THREE TIMES DAILY 01/24/19  Yes Kathrynn Ducking, MD  Multiple Vitamin (MULTIVITAMIN) tablet Take 1 tablet by mouth daily. Centrum silver   Yes [provider]  simvastatin (ZOCOR) 40 MG tablet Take 1 tablet (40 mg total) by mouth at bedtime. 02/20/15  Yes Paz, Alda Berthold, MD  cephALEXin (KEFLEX) 500 MG capsule Take  1 capsule (500 mg total) by mouth 2 (two) times daily for 10 days. 07/15/19 07/25/19  Lennice Sites, DO  clotrimazole-betamethasone (LOTRISONE) cream apply to affected area twice a day topically Patient not taking: Reported on 01/26/2018 03/25/14   Colon Branch, MD    Allergies    Patient has no known allergies.  Review of Systems   Review of Systems  Constitutional: Negative for chills and fever.  HENT: Negative for ear pain and sore throat.   Eyes: Negative for pain and visual disturbance.  Respiratory: Negative for cough and shortness of breath.   Cardiovascular: Negative for chest pain and palpitations.  Gastrointestinal: Negative for abdominal pain and vomiting.    Genitourinary: Positive for dysuria and hematuria. Negative for decreased urine volume, difficulty urinating, discharge, enuresis, flank pain, frequency, genital sores, penile pain, penile swelling, scrotal swelling, testicular pain and urgency.  Musculoskeletal: Negative for arthralgias and back pain.  Skin: Negative for color change and rash.  Neurological: Negative for seizures, syncope and headaches.  All other systems reviewed and are negative.   Physical Exam Updated Vital Signs  ED Triage Vitals  Enc Vitals Group     BP 07/15/19 1156 (!) 182/87     Pulse Rate 07/15/19 1156 78     Resp 07/15/19 1156 17     Temp 07/15/19 1156 98.6 F (37 C)     Temp Source 07/15/19 1156 Oral     SpO2 07/15/19 1156 98 %     Weight --      Height --      Head Circumference --      Peak Flow --      Pain Score 07/15/19 1158 10     Pain Loc --      Pain Edu? --      Excl. in [place]? --     Physical Exam Vitals and nursing note reviewed.  Constitutional:      General: He is not in acute distress.    Appearance: He is well-developed. He is not ill-appearing.  HENT:     Head: Normocephalic and atraumatic.     Nose: Nose normal.     Mouth/Throat:     Mouth: Mucous membranes are moist.  Eyes:     Extraocular Movements: Extraocular movements intact.     Conjunctiva/sclera: Conjunctivae normal.     Pupils: Pupils are equal, round, and reactive to light.  Cardiovascular:     Rate and Rhythm: Normal rate and regular rhythm.     Pulses: Normal pulses.     Heart sounds: Normal heart sounds. No murmur.  Pulmonary:     Effort: Pulmonary effort is normal. No respiratory distress.     Breath sounds: Normal breath sounds.  Abdominal:     Palpations: Abdomen is soft.     Tenderness: There is no abdominal tenderness.  Genitourinary:    Testes: Normal.     Comments: Scant blood at the urethral meatus, no tenderness to the penis or testicles Musculoskeletal:        General: Normal range of motion.      Cervical back: Normal range of motion and neck supple.  Lymphadenopathy:     Cervical: Cervical adenopathy present.  Skin:    General: Skin is warm and dry.     Capillary Refill: Capillary refill takes less than 2 seconds.  Neurological:     General: No focal deficit present.     Mental Status: He is alert.  Psychiatric:  Mood and Affect: Mood normal.     ED Results / Procedures / Treatments   Labs (all labs ordered are listed, but only abnormal results are displayed) Labs Reviewed  URINALYSIS, ROUTINE W REFLEX MICROSCOPIC - Abnormal; Notable for the following components:      Result Value   Color, Urine RED (*)    APPearance TURBID (*)    Glucose, UA   (*)    Value: TEST NOT REPORTED DUE TO COLOR INTERFERENCE OF URINE PIGMENT   Hgb urine dipstick   (*)    Value: TEST NOT REPORTED DUE TO COLOR INTERFERENCE OF URINE PIGMENT   Bilirubin Urine   (*)    Value: TEST NOT REPORTED DUE TO COLOR INTERFERENCE OF URINE PIGMENT   Ketones, ur   (*)    Value: TEST NOT REPORTED DUE TO COLOR INTERFERENCE OF URINE PIGMENT   Protein, ur   (*)    Value: TEST NOT REPORTED DUE TO COLOR INTERFERENCE OF URINE PIGMENT   Nitrite   (*)    Value: TEST NOT REPORTED DUE TO COLOR INTERFERENCE OF URINE PIGMENT   Leukocytes,Ua   (*)    Value: TEST NOT REPORTED DUE TO COLOR INTERFERENCE OF URINE PIGMENT   All other components within normal limits  CBC WITH DIFFERENTIAL/PLATELET - Abnormal; Notable for the following components:   WBC 10.9 (*)    RBC 3.97 (*)    Hemoglobin 12.9 (*)    HCT 38.6 (*)    Platelets 138 (*)    Neutro Abs 9.5 (*)    Lymphs Abs 0.6 (*)    All other components within normal limits  BASIC METABOLIC PANEL - Abnormal; Notable for the following components:   Glucose, Bld 125 (*)    BUN 26 (*)    Creatinine, Ser 1.40 (*)    GFR calc non Af Amer 42 (*)    GFR calc Af Amer 48 (*)    All other components within normal limits  URINE CULTURE  HEPATIC FUNCTION PANEL   LIPASE, BLOOD  URINALYSIS, MICROSCOPIC (REFLEX)    EKG None  Radiology No results found.  Procedures Procedures (including critical care time)  Medications Ordered in ED Medications  sodium chloride 0.9 % bolus 1,000 mL (0 mLs Intravenous Stopped 07/15/19 1430)  cefTRIAXone (ROCEPHIN) 1 g in sodium chloride 0.9 % 100 mL IVPB (0 g Intravenous Stopped 07/15/19 1726)  sodium chloride 0.9 % bolus 250 mL (0 mLs Intravenous Stopped 07/15/19 1730)    ED Course  I have reviewed the triage vital signs and the nursing notes.  Pertinent labs & imaging results that were available during my care of the patient were reviewed by me and considered in my medical decision making (see chart for details).    MDM Rules/Calculators/A&P  [name] is a 86 year old male with history of Parkinson's disease, prostate cancer who presents to the ED with hematuria.  Patient with hypertension but otherwise unremarkable vitals.  No fever.  Hematuria since yesterday.  History of the same when he has urinary tract infections.  Denies any retention or abdominal pain.  Sent from primary care doctor's office for evaluation.  Patient is not on any blood thinners.  No history of smoking or bladder cancer.  Overall patient is well-appearing.  He has no abdominal tenderness.  He has some blood at the urethral meatus but no tenderness to his testicles or penis.  Concern for hemorrhagic cystitis/urinary retention.  Will evaluate for infection.  Will have patient void into  a post void bladder scan.  Will consider doing catheter.  Patient does not appear to have fever or abnormal vital signs to suggest sepsis at this time.  No significant leukocytosis, electrolyte abnormality.  Mild elevation in creatinine.  Patient was given a liter and a half of fluids.  He did appear to have retention with greater than 200 cc of urine in the bladder.  Foley catheter was placed.  Foley was irrigated and patient had good return of urine that  was grossly bloody.  Blood clots were not seen in the catheter.  Patient was given a dose of Rocephin.  Suspect likely hemorrhagic cystitis.  He does have a history of prostate cancer.  Cystoscopy done several years ago was unremarkable.  No polyps.  Foley has been placed.  Will give antibiotics.  Have provided information for urology follow-up.  They understand return precautions.  This chart was dictated using voice recognition software.  Despite best efforts to proofread,  errors can occur which can change the documentation meaning.    Final Clinical Impression(s) / ED Diagnoses Final diagnoses:  Gross hematuria  Cystitis    Rx / DC Orders ED Discharge Orders         Ordered    cephALEXin (KEFLEX) 500 MG capsule  2 times daily     07/15/19 2025           Lennice Sites, DO 07/15/19 2030

## 2019-07-15 NOTE — ED Notes (Signed)
Made Dr Ronnald Nian made aware of BP 209/93. No new orders at this time.

## 2019-07-15 NOTE — ED Notes (Signed)
Patient and wife educated on how to empty leg bag. Wife demonstrated emptying bag using proper hand washing technique.

## 2019-07-16 LAB — URINE CULTURE

## 2019-07-28 ENCOUNTER — Ambulatory Visit: Payer: Medicare PPO | Attending: Internal Medicine

## 2019-07-28 DIAGNOSIS — Z23 Encounter for immunization: Secondary | ICD-10-CM | POA: Insufficient documentation

## 2019-07-28 NOTE — Progress Notes (Signed)
   Covid-19 Vaccination Clinic  Name:  [name]    MRN: [id number] DOB: 12-22-1918  07/28/2019  [name] was observed post Covid-19 immunization for 15 minutes without incidence. He was provided with Vaccine Information Sheet and instruction to access the V-Safe system.   [name] was instructed to call 911 with any severe reactions post vaccine: [name] Kitchen Difficulty breathing  . Swelling of your face and throat  . A fast heartbeat  . A bad rash all over your body  . Dizziness and weakness    Immunizations Administered    Name Date Dose VIS Date Route   Pfizer COVID-19 Vaccine 07/28/2019  1:52 PM 0.3 mL 05/25/2019 Intramuscular   Manufacturer: [place]   Lot: [id number]   Archie: [id number]

## 2019-08-20 ENCOUNTER — Ambulatory Visit: Payer: Medicare PPO | Attending: Internal Medicine

## 2019-08-20 DIAGNOSIS — Z23 Encounter for immunization: Secondary | ICD-10-CM | POA: Insufficient documentation

## 2019-08-20 NOTE — Progress Notes (Signed)
   Covid-19 Vaccination Clinic  Name:  [name]    MRN: [id number] DOB: [birth year]  08/20/2019  [name] was observed post Covid-19 immunization for 15 minutes without incident. He was provided with Vaccine Information Sheet and instruction to access the V-Safe system.   [name] was instructed to call 911 with any severe reactions post vaccine: [name] Kitchen Difficulty breathing  . Swelling of face and throat  . A fast heartbeat  . A bad rash all over body  . Dizziness and weakness   Immunizations Administered    Name Date Dose VIS Date Route   Pfizer COVID-19 Vaccine 08/20/2019 11:34 AM 0.3 mL 05/25/2019 Intramuscular   Manufacturer: [place]   Lot: [id number]   [place]: [id number]

## 2019-11-10 DIAGNOSIS — I10 Essential (primary) hypertension: Secondary | ICD-10-CM | POA: Diagnosis not present

## 2019-11-10 DIAGNOSIS — I739 Peripheral vascular disease, unspecified: Secondary | ICD-10-CM | POA: Diagnosis not present

## 2019-11-10 DIAGNOSIS — N529 Male erectile dysfunction, unspecified: Secondary | ICD-10-CM | POA: Diagnosis not present

## 2019-11-10 DIAGNOSIS — G20 Parkinson's disease: Secondary | ICD-10-CM | POA: Diagnosis not present

## 2019-11-10 DIAGNOSIS — E785 Hyperlipidemia, unspecified: Secondary | ICD-10-CM | POA: Diagnosis not present

## 2019-11-10 DIAGNOSIS — F0280 Dementia in other diseases classified elsewhere without behavioral disturbance: Secondary | ICD-10-CM | POA: Diagnosis not present

## 2019-11-10 DIAGNOSIS — Z87891 Personal history of nicotine dependence: Secondary | ICD-10-CM | POA: Diagnosis not present

## 2019-11-23 ENCOUNTER — Other Ambulatory Visit: Payer: Self-pay | Admitting: Neurology

## 2020-04-17 DIAGNOSIS — Z79899 Other long term (current) drug therapy: Secondary | ICD-10-CM | POA: Diagnosis not present

## 2020-04-17 DIAGNOSIS — R319 Hematuria, unspecified: Secondary | ICD-10-CM | POA: Diagnosis not present

## 2020-04-17 DIAGNOSIS — I739 Peripheral vascular disease, unspecified: Secondary | ICD-10-CM | POA: Diagnosis not present

## 2020-04-17 DIAGNOSIS — D508 Other iron deficiency anemias: Secondary | ICD-10-CM | POA: Diagnosis not present

## 2020-04-17 DIAGNOSIS — M25471 Effusion, right ankle: Secondary | ICD-10-CM | POA: Diagnosis not present

## 2020-04-17 DIAGNOSIS — Z23 Encounter for immunization: Secondary | ICD-10-CM | POA: Diagnosis not present

## 2020-04-17 DIAGNOSIS — G20 Parkinson's disease: Secondary | ICD-10-CM | POA: Diagnosis not present

## 2020-04-18 ENCOUNTER — Other Ambulatory Visit (HOSPITAL_COMMUNITY): Payer: Self-pay | Admitting: Family Medicine

## 2020-04-18 ENCOUNTER — Other Ambulatory Visit: Payer: Self-pay

## 2020-04-18 ENCOUNTER — Ambulatory Visit (HOSPITAL_COMMUNITY)
Admission: RE | Admit: 2020-04-18 | Discharge: 2020-04-18 | Disposition: A | Discharge status: Home / Self Care | Payer: Medicare PPO | Source: Ambulatory Visit | Attending: Family Medicine | Admitting: Family Medicine

## 2020-04-18 DIAGNOSIS — M25471 Effusion, right ankle: Secondary | ICD-10-CM | POA: Diagnosis not present

## 2020-04-18 NOTE — Progress Notes (Signed)
Lower extremity venous RT study completed.  Preliminary results relayed to Anderson Malta for Jacelyn Grip, MD.   See CV Proc for preliminary results report.   Darlin Coco, RDMS

## 2020-05-13 DIAGNOSIS — N35012 Post-traumatic membranous urethral stricture: Secondary | ICD-10-CM | POA: Diagnosis not present

## 2020-05-13 DIAGNOSIS — R310 Gross hematuria: Secondary | ICD-10-CM | POA: Diagnosis not present

## 2020-05-21 DIAGNOSIS — R310 Gross hematuria: Secondary | ICD-10-CM | POA: Diagnosis not present

## 2020-05-29 DIAGNOSIS — R310 Gross hematuria: Secondary | ICD-10-CM | POA: Diagnosis not present

## 2020-05-29 DIAGNOSIS — Z8546 Personal history of malignant neoplasm of prostate: Secondary | ICD-10-CM | POA: Diagnosis not present

## 2020-09-30 ENCOUNTER — Other Ambulatory Visit (HOSPITAL_COMMUNITY): Payer: Self-pay | Admitting: Family Medicine

## 2020-09-30 DIAGNOSIS — R3589 Other polyuria: Secondary | ICD-10-CM | POA: Diagnosis not present

## 2020-09-30 DIAGNOSIS — G20 Parkinson's disease: Secondary | ICD-10-CM | POA: Diagnosis not present

## 2020-09-30 DIAGNOSIS — Z0000 Encounter for general adult medical examination without abnormal findings: Secondary | ICD-10-CM | POA: Diagnosis not present

## 2020-09-30 DIAGNOSIS — E7800 Pure hypercholesterolemia, unspecified: Secondary | ICD-10-CM | POA: Diagnosis not present

## 2020-09-30 DIAGNOSIS — Z79899 Other long term (current) drug therapy: Secondary | ICD-10-CM | POA: Diagnosis not present

## 2020-09-30 DIAGNOSIS — I739 Peripheral vascular disease, unspecified: Secondary | ICD-10-CM

## 2020-09-30 DIAGNOSIS — M47812 Spondylosis without myelopathy or radiculopathy, cervical region: Secondary | ICD-10-CM | POA: Diagnosis not present

## 2020-09-30 DIAGNOSIS — Z23 Encounter for immunization: Secondary | ICD-10-CM | POA: Diagnosis not present

## 2020-10-02 ENCOUNTER — Encounter (HOSPITAL_COMMUNITY): Payer: Medicare PPO

## 2020-10-02 ENCOUNTER — Ambulatory Visit (HOSPITAL_COMMUNITY)
Admission: RE | Admit: 2020-10-02 | Discharge: 2020-10-02 | Disposition: A | Discharge status: Home / Self Care | Payer: Medicare PPO | Source: Ambulatory Visit | Attending: Family Medicine | Admitting: Family Medicine

## 2020-10-02 ENCOUNTER — Other Ambulatory Visit: Payer: Self-pay

## 2020-10-02 DIAGNOSIS — I739 Peripheral vascular disease, unspecified: Secondary | ICD-10-CM | POA: Diagnosis not present

## 2020-10-02 NOTE — Progress Notes (Incomplete)
{  Select Note:[id number]} 

## 2020-10-02 NOTE — Progress Notes (Signed)
ABI completed. Refer to "CV Proc" under chart review to view preliminary results.  10/02/2020 2:24 PM Kelby Aline., MHA, RVT, RDCS, RDMS

## 2020-11-13 ENCOUNTER — Ambulatory Visit: Payer: Medicare PPO

## 2020-11-17 ENCOUNTER — Other Ambulatory Visit: Payer: Self-pay

## 2020-11-17 ENCOUNTER — Ambulatory Visit: Payer: Medicare PPO | Attending: Family Medicine

## 2020-11-17 DIAGNOSIS — M6281 Muscle weakness (generalized): Secondary | ICD-10-CM | POA: Insufficient documentation

## 2020-11-17 DIAGNOSIS — R2689 Other abnormalities of gait and mobility: Secondary | ICD-10-CM | POA: Diagnosis not present

## 2020-11-17 DIAGNOSIS — R2681 Unsteadiness on feet: Secondary | ICD-10-CM | POA: Insufficient documentation

## 2020-11-17 NOTE — Therapy (Signed)
[facility] [address] [place], [place], [address] Phone: [phone]   Fax:  [phone]  Physical Therapy Treatment  Patient Details  Name: [name] MRN: [id number] Date of Birth: [birth year] Referring Provider (PT): Dr. Yaakov Guthrie   Encounter Date: 11/17/2020   PT End of Session - 11/17/20 1720    Visit Number 1    Number of Visits 5    Date for PT Re-Evaluation 12/22/20    Authorization Type [employer]    PT Start Time 1497    PT Stop Time 1700    PT Time Calculation (min) 45 min    Equipment Utilized During Treatment Gait belt    Activity Tolerance Patient tolerated treatment well    Behavior During Therapy [facility] for tasks assessed/performed           Past Medical History:  Diagnosis Date  . Allergy    rhinitis  . Diabetes mellitus    dx 3-11, A1C 6.7  . Gout   . Hiatal hernia   . Hyperlipidemia   . Parkinson disease ([place]) 10/23/2015  . Prostate CA [facility]) 2002   s/p  x-ray therapy and surgery  . Skin cancer   . Trigeminal neuralgia    h/o , status post gamma knife procedure and trial with multiple drugs    Past Surgical History:  Procedure Laterality Date  . CHOLECYSTECTOMY      There were no vitals filed for this visit.   Subjective Assessment - 11/17/20 1630    Subjective patient spouse serves as interpretter, referred by Dr. Jacelyn Grip to assess gait and fall risk, establish HEP and CG training    Patient is accompained by: Family member    How long can you sit comfortably? no issues    How long can you stand comfortably? 10 min    How long can you walk comfortably? 7-8 blocks    Patient Stated Goals To walk to church every day    Currently in Pain? No/denies              [facility] PT Assessment - 11/17/20 0001      Assessment   Medical Diagnosis PD    Referring Provider (PT) Dr. Yaakov Guthrie    Next MD Visit PRN    Prior Therapy OPPT 2019      Precautions   Precautions Fall       Balance Screen   Has the patient fallen in the past 6 months No    Has the patient had a decrease in activity level because of a fear of falling?  No    Is the patient reluctant to leave their home because of a fear of falling?  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      ROM / Strength   AROM / PROM / Strength Strength      Strength   Overall Strength Deficits    Overall Strength Comments 4/5 BLE strength grossly      Transfers   Transfers Sit to Stand;Stand to Sit    Sit to Stand 4: Min guard    Five time sit to stand comments  21.35s    Stand to Sit 4: Min guard      Ambulation/Gait   Ambulation/Gait Yes  Ambulation/Gait Assistance 4: Min guard    Ambulation Distance (Feet) 100 Feet    Assistive device 1 person hand held assist    Gait Pattern Shuffle    Ambulation Surface Level;Indoor                                 PT Education - 11/17/20 1719    Education Details Patient and spouse informed of eval findings, potential and POC and are in agreement    Person(s) Educated Patient;Spouse    Methods Explanation    Comprehension Verbalized understanding;Returned demonstration;Need further instruction            PT Short Term Goals - 11/17/20 1725      PT SHORT TERM GOAL #1   Title Patirent and spouse to demo independence in HEP    Baseline TBD    Time 5    Period Weeks    Status New    Target Date 12/29/20      PT SHORT TERM GOAL #2   Title Patient to decreased 5x STS time to <18s    Baseline 21.35s    Time 5    Period Weeks    Target Date 12/29/20      PT SHORT TERM GOAL #3   Title Improve gait velocity to 0.4 m/s w/HHA    Baseline 0.26 m/s w/HHA    Time 5    Period Weeks    Status New    Target Date 12/29/20      PT SHORT TERM GOAL #4   Title patient to ambulate 552ft with HA  showing increased step length    Baseline 19ft with HHA with decreased step length    Time 5    Period Weeks    Status New    Target Date 12/29/20                    Plan - 11/17/20 1729    Clinical Impression Statement Patient presents with age related decreased functional mobility, he is referred to PT to improve his ambulatory capacity as he presentts with decreased STS times and gait velocity below functional limits.  He prefers to ambulate wit HHA vs AD.  He is able to transfer under S and ambulate arm in arm for 126ft through gym.  Patient and spouse goalis to improve ambulation baility to allow patient to ambulate outside daily    Personal Factors and Comorbidities Age    Examination-Activity Limitations Locomotion Level;Transfers    Examination-Participation Restrictions [employer]    Stability/Clinical Decision Making Stable/Uncomplicated    Clinical Decision Making Low    Rehab Potential Good    PT Frequency 1x / week    PT Duration 4 weeks    PT Treatment/Interventions ADLs/Self Care Home Management;Gait training;Stair training;Functional mobility training;Therapeutic activities;Therapeutic exercise;Balance training;Neuromuscular re-education;Patient/family education;Cognitive remediation;DME Instruction    PT Next Visit Plan Establish HEP, assess TUG and outsde ambulaiton    PT Home Exercise Plan daily ambulation    Consulted and Agree with Plan of Care Patient;Family member/caregiver    Family Member Consulted Spouse           Patient will benefit from skilled therapeutic intervention in order to improve the following deficits and impairments:  Abnormal gait,Decreased coordination,Difficulty walking,Decreased endurance,Decreased balance,Decreased mobility,Decreased strength  Visit Diagnosis: Unsteadiness on feet  Muscle weakness (generalized)  Other abnormalities of gait and mobility     Problem List  Patient Active Problem List   Diagnosis Date Noted  .  Sialorrhea 01/28/2016  . Memory disorder 01/28/2016  . Parkinson disease ([place]) 10/23/2015  . Gait disorder 02/24/2014  . Edema 09/28/2011  . Bronchospasm 08/18/2011  . Annual physical exam 03/31/2011  . RASH-NONVESICULAR 06/09/2010  . ANEMIA 12/30/2009  . DM 09/01/2009  . ALLERGIC RHINITIS 08/20/2009  . NEOPLASM, MALIGNANT, SKIN 08/30/2007  . ADENOCARCINOMA, PROSTATE, HX OF 03/21/2007  . HYPERLIPIDEMIA 09/29/2006  . GOUT 09/29/2006  . HIATAL HERNIA 09/29/2006    Lanice Shirts 11/17/2020, 5:35 PM  [facility] [address] [place], [place], [address] Phone: [phone]   Fax:  [phone]  Name: [name] MRN: [id number] Date of Birth: April 29, 1919

## 2020-11-17 NOTE — Therapy (Signed)
[facility] [address] [place], [place], [address] Phone: [phone]   Fax:  [phone]  Physical Therapy Evaluation  Patient Details  Name: [name] MRN: [id number] Date of Birth: Apr 19, 1919 Referring Provider (PT): Dr. Yaakov Guthrie   Encounter Date: 11/17/2020   PT End of Session - 11/17/20 1720    Visit Number 1    Number of Visits 5    Date for PT Re-Evaluation 12/22/20    Authorization Type [employer]    PT Start Time 4709    PT Stop Time 1700    PT Time Calculation (min) 45 min    Equipment Utilized During Treatment Gait belt    Activity Tolerance Patient tolerated treatment well    Behavior During Therapy [facility] for tasks assessed/performed           Past Medical History:  Diagnosis Date  . Allergy    rhinitis  . Diabetes mellitus    dx 3-11, A1C 6.7  . Gout   . Hiatal hernia   . Hyperlipidemia   . Parkinson disease ([place]) 10/23/2015  . Prostate CA [facility]) 2002   s/p  x-ray therapy and surgery  . Skin cancer   . Trigeminal neuralgia    h/o , status post gamma knife procedure and trial with multiple drugs    Past Surgical History:  Procedure Laterality Date  . CHOLECYSTECTOMY      There were no vitals filed for this visit.    Subjective Assessment - 11/17/20 1630    Subjective patient spouse serves as interpretter, referred by Dr. Jacelyn Grip to assess gait and fall risk, establish HEP and CG training    Patient is accompained by: Family member    How long can you sit comfortably? no issues    How long can you stand comfortably? 10 min    How long can you walk comfortably? 7-8 blocks    Patient Stated Goals To walk to church every day    Currently in Pain? No/denies              [facility] PT Assessment - 11/17/20 0001      Assessment   Medical Diagnosis PD    Referring Provider (PT) Dr. Yaakov Guthrie    Next MD Visit PRN    Prior Therapy OPPT 2019      Precautions   Precautions Fall       Balance Screen   Has the patient fallen in the past 6 months No    Has the patient had a decrease in activity level because of a fear of falling?  No    Is the patient reluctant to leave their home because of a fear of falling?  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      ROM / Strength   AROM / PROM / Strength Strength      Strength   Overall Strength Deficits    Overall Strength Comments 4/5 BLE strength grossly      Transfers   Transfers Sit to Stand;Stand to Sit    Sit to Stand 4: Min guard    Five time sit to stand comments  21.35s    Stand to Sit 4: Min guard      Ambulation/Gait   Ambulation/Gait Yes  Ambulation/Gait Assistance 4: Min guard    Ambulation Distance (Feet) 100 Feet    Assistive device 1 person hand held assist    Gait Pattern Shuffle    Ambulation Surface Level;Indoor                      Objective measurements completed on examination: See above findings.               PT Education - 11/17/20 1719    Education Details Patient and spouse informed of eval findings, potential and POC and are in agreement    Person(s) Educated Patient;Spouse    Methods Explanation    Comprehension Verbalized understanding;Returned demonstration;Need further instruction            PT Short Term Goals - 11/17/20 1725      PT SHORT TERM GOAL #1   Title Patirent and spouse to demo independence in HEP    Baseline TBD    Time 5    Period Weeks    Status New    Target Date 12/29/20      PT SHORT TERM GOAL #2   Title Patient to decreased 5x STS time to <18s    Baseline 21.35s    Time 5    Period Weeks    Target Date 12/29/20      PT SHORT TERM GOAL #3   Title Improve gait velocity to 0.4 m/s w/HHA    Baseline 0.26 m/s w/HHA    Time 5    Period Weeks    Status New    Target Date  12/29/20      PT SHORT TERM GOAL #4   Title patient to ambulate 532ft with HA showing increased step length    Baseline 149ft with HHA with decreased step length    Time 5    Period Weeks    Status New    Target Date 12/29/20                     Plan - 11/17/20 1729    Clinical Impression Statement Patient presents with age related decreased functional mobility, he is referred to PT to improve his ambulatory capacity as he presentts with decreased STS times and gait velocity below functional limits.  He prefers to ambulate wit HHA vs AD.  He is able to transfer under S and ambulate arm in arm for 151ft through gym.  Patient and spouse goalis to improve ambulation baility to allow patient to ambulate outside daily    Personal Factors and Comorbidities Age    Examination-Activity Limitations Locomotion Level;Transfers    Examination-Participation Restrictions [employer]    Stability/Clinical Decision Making Stable/Uncomplicated    Clinical Decision Making Low    Rehab Potential Good    PT Frequency 1x / week    PT Duration 4 weeks    PT Treatment/Interventions ADLs/Self Care Home Management;Gait training;Stair training;Functional mobility training;Therapeutic activities;Therapeutic exercise;Balance training;Neuromuscular re-education;Patient/family education;Cognitive remediation;DME Instruction    PT Next Visit Plan Establish HEP, assess TUG and outsde ambulaiton    PT Home Exercise Plan daily ambulation    Consulted and Agree with Plan of Care Patient;Family member/caregiver    Family Member Consulted Spouse           Patient will benefit from skilled therapeutic intervention in order to improve the following deficits and impairments:  Abnormal gait,Decreased coordination,Difficulty walking,Decreased endurance,Decreased balance,Decreased mobility,Decreased strength  Visit Diagnosis: Unsteadiness on feet  Muscle weakness (generalized)  Other abnormalities of gait and  mobility     Problem List Patient Active Problem List   Diagnosis Date Noted  . Sialorrhea 01/28/2016  . Memory disorder 01/28/2016  . Parkinson disease ([place]) 10/23/2015  . Gait disorder 02/24/2014  . Edema 09/28/2011  . Bronchospasm 08/18/2011  . Annual physical exam 03/31/2011  . RASH-NONVESICULAR 06/09/2010  . ANEMIA 12/30/2009  . DM 09/01/2009  . ALLERGIC RHINITIS 08/20/2009  . NEOPLASM, MALIGNANT, SKIN 08/30/2007  . ADENOCARCINOMA, PROSTATE, HX OF 03/21/2007  . HYPERLIPIDEMIA 09/29/2006  . GOUT 09/29/2006  . HIATAL HERNIA 09/29/2006    Lanice Shirts PT 11/17/2020, 5:35 PM  [facility] [address] [place], [place], [address] Phone: [phone]   Fax:  [phone]  Name: [name] MRN: [id number] Date of Birth: 08-Mar-1919

## 2020-11-24 ENCOUNTER — Emergency Department (HOSPITAL_COMMUNITY): Payer: Medicare PPO

## 2020-11-24 ENCOUNTER — Ambulatory Visit: Payer: Medicare PPO

## 2020-11-24 ENCOUNTER — Encounter (HOSPITAL_COMMUNITY): Payer: Self-pay | Admitting: Internal Medicine

## 2020-11-24 ENCOUNTER — Inpatient Hospital Stay (HOSPITAL_COMMUNITY)
Admission: EM | Admit: 2020-11-24 | Discharge: 2020-12-04 | DRG: 871 | Disposition: A | Discharge status: Hospice | Payer: Medicare PPO | Attending: Family Medicine | Admitting: Family Medicine

## 2020-11-24 DIAGNOSIS — Z85828 Personal history of other malignant neoplasm of skin: Secondary | ICD-10-CM

## 2020-11-24 DIAGNOSIS — Z841 Family history of disorders of kidney and ureter: Secondary | ICD-10-CM

## 2020-11-24 DIAGNOSIS — Y92002 Bathroom of unspecified non-institutional (private) residence single-family (private) house as the place of occurrence of the external cause: Secondary | ICD-10-CM | POA: Diagnosis not present

## 2020-11-24 DIAGNOSIS — N179 Acute kidney failure, unspecified: Secondary | ICD-10-CM | POA: Diagnosis present

## 2020-11-24 DIAGNOSIS — E119 Type 2 diabetes mellitus without complications: Secondary | ICD-10-CM | POA: Diagnosis not present

## 2020-11-24 DIAGNOSIS — E785 Hyperlipidemia, unspecified: Secondary | ICD-10-CM | POA: Diagnosis not present

## 2020-11-24 DIAGNOSIS — G9340 Encephalopathy, unspecified: Secondary | ICD-10-CM | POA: Diagnosis not present

## 2020-11-24 DIAGNOSIS — R627 Adult failure to thrive: Secondary | ICD-10-CM | POA: Diagnosis present

## 2020-11-24 DIAGNOSIS — A419 Sepsis, unspecified organism: Secondary | ICD-10-CM | POA: Diagnosis not present

## 2020-11-24 DIAGNOSIS — N3001 Acute cystitis with hematuria: Secondary | ICD-10-CM | POA: Diagnosis not present

## 2020-11-24 DIAGNOSIS — Z515 Encounter for palliative care: Secondary | ICD-10-CM | POA: Diagnosis not present

## 2020-11-24 DIAGNOSIS — A4159 Other Gram-negative sepsis: Secondary | ICD-10-CM | POA: Diagnosis not present

## 2020-11-24 DIAGNOSIS — J90 Pleural effusion, not elsewhere classified: Secondary | ICD-10-CM | POA: Diagnosis present

## 2020-11-24 DIAGNOSIS — R509 Fever, unspecified: Secondary | ICD-10-CM | POA: Diagnosis not present

## 2020-11-24 DIAGNOSIS — R410 Disorientation, unspecified: Secondary | ICD-10-CM | POA: Diagnosis not present

## 2020-11-24 DIAGNOSIS — Z833 Family history of diabetes mellitus: Secondary | ICD-10-CM

## 2020-11-24 DIAGNOSIS — D6959 Other secondary thrombocytopenia: Secondary | ICD-10-CM | POA: Diagnosis present

## 2020-11-24 DIAGNOSIS — R9431 Abnormal electrocardiogram [ECG] [EKG]: Secondary | ICD-10-CM | POA: Diagnosis not present

## 2020-11-24 DIAGNOSIS — J9601 Acute respiratory failure with hypoxia: Secondary | ICD-10-CM | POA: Diagnosis not present

## 2020-11-24 DIAGNOSIS — D631 Anemia in chronic kidney disease: Secondary | ICD-10-CM | POA: Diagnosis present

## 2020-11-24 DIAGNOSIS — Z20822 Contact with and (suspected) exposure to covid-19: Secondary | ICD-10-CM | POA: Diagnosis present

## 2020-11-24 DIAGNOSIS — W19XXXA Unspecified fall, initial encounter: Secondary | ICD-10-CM | POA: Diagnosis not present

## 2020-11-24 DIAGNOSIS — E1122 Type 2 diabetes mellitus with diabetic chronic kidney disease: Secondary | ICD-10-CM | POA: Diagnosis present

## 2020-11-24 DIAGNOSIS — G9341 Metabolic encephalopathy: Secondary | ICD-10-CM | POA: Diagnosis present

## 2020-11-24 DIAGNOSIS — I447 Left bundle-branch block, unspecified: Secondary | ICD-10-CM | POA: Diagnosis not present

## 2020-11-24 DIAGNOSIS — R0902 Hypoxemia: Secondary | ICD-10-CM

## 2020-11-24 DIAGNOSIS — Z66 Do not resuscitate: Secondary | ICD-10-CM | POA: Diagnosis not present

## 2020-11-24 DIAGNOSIS — D509 Iron deficiency anemia, unspecified: Secondary | ICD-10-CM | POA: Diagnosis present

## 2020-11-24 DIAGNOSIS — E872 Acidosis, unspecified: Secondary | ICD-10-CM | POA: Diagnosis present

## 2020-11-24 DIAGNOSIS — M5030 Other cervical disc degeneration, unspecified cervical region: Secondary | ICD-10-CM | POA: Diagnosis not present

## 2020-11-24 DIAGNOSIS — N390 Urinary tract infection, site not specified: Secondary | ICD-10-CM

## 2020-11-24 DIAGNOSIS — R6520 Severe sepsis without septic shock: Secondary | ICD-10-CM | POA: Diagnosis present

## 2020-11-24 DIAGNOSIS — I129 Hypertensive chronic kidney disease with stage 1 through stage 4 chronic kidney disease, or unspecified chronic kidney disease: Secondary | ICD-10-CM | POA: Diagnosis present

## 2020-11-24 DIAGNOSIS — R7989 Other specified abnormal findings of blood chemistry: Secondary | ICD-10-CM | POA: Diagnosis present

## 2020-11-24 DIAGNOSIS — I517 Cardiomegaly: Secondary | ICD-10-CM | POA: Diagnosis not present

## 2020-11-24 DIAGNOSIS — N189 Chronic kidney disease, unspecified: Secondary | ICD-10-CM | POA: Diagnosis not present

## 2020-11-24 DIAGNOSIS — I248 Other forms of acute ischemic heart disease: Secondary | ICD-10-CM | POA: Diagnosis present

## 2020-11-24 DIAGNOSIS — R0689 Other abnormalities of breathing: Secondary | ICD-10-CM

## 2020-11-24 DIAGNOSIS — G20 Parkinson's disease: Secondary | ICD-10-CM | POA: Diagnosis not present

## 2020-11-24 DIAGNOSIS — R1310 Dysphagia, unspecified: Secondary | ICD-10-CM | POA: Diagnosis present

## 2020-11-24 DIAGNOSIS — N1832 Chronic kidney disease, stage 3b: Secondary | ICD-10-CM | POA: Diagnosis present

## 2020-11-24 DIAGNOSIS — I4891 Unspecified atrial fibrillation: Secondary | ICD-10-CM | POA: Diagnosis present

## 2020-11-24 DIAGNOSIS — R531 Weakness: Secondary | ICD-10-CM | POA: Diagnosis not present

## 2020-11-24 DIAGNOSIS — I10 Essential (primary) hypertension: Secondary | ICD-10-CM | POA: Diagnosis not present

## 2020-11-24 DIAGNOSIS — Z8546 Personal history of malignant neoplasm of prostate: Secondary | ICD-10-CM

## 2020-11-24 DIAGNOSIS — E871 Hypo-osmolality and hyponatremia: Secondary | ICD-10-CM | POA: Diagnosis present

## 2020-11-24 DIAGNOSIS — B957 Other staphylococcus as the cause of diseases classified elsewhere: Secondary | ICD-10-CM | POA: Diagnosis present

## 2020-11-24 DIAGNOSIS — R4182 Altered mental status, unspecified: Secondary | ICD-10-CM | POA: Diagnosis not present

## 2020-11-24 DIAGNOSIS — Z9049 Acquired absence of other specified parts of digestive tract: Secondary | ICD-10-CM

## 2020-11-24 DIAGNOSIS — R54 Age-related physical debility: Secondary | ICD-10-CM | POA: Diagnosis present

## 2020-11-24 DIAGNOSIS — D649 Anemia, unspecified: Secondary | ICD-10-CM | POA: Diagnosis not present

## 2020-11-24 DIAGNOSIS — Z9079 Acquired absence of other genital organ(s): Secondary | ICD-10-CM

## 2020-11-24 DIAGNOSIS — E860 Dehydration: Secondary | ICD-10-CM | POA: Diagnosis present

## 2020-11-24 DIAGNOSIS — Z7189 Other specified counseling: Secondary | ICD-10-CM | POA: Diagnosis not present

## 2020-11-24 DIAGNOSIS — M4802 Spinal stenosis, cervical region: Secondary | ICD-10-CM | POA: Diagnosis not present

## 2020-11-24 DIAGNOSIS — Z8249 Family history of ischemic heart disease and other diseases of the circulatory system: Secondary | ICD-10-CM

## 2020-11-24 DIAGNOSIS — I6529 Occlusion and stenosis of unspecified carotid artery: Secondary | ICD-10-CM | POA: Diagnosis not present

## 2020-11-24 DIAGNOSIS — Z043 Encounter for examination and observation following other accident: Secondary | ICD-10-CM | POA: Diagnosis not present

## 2020-11-24 DIAGNOSIS — N3000 Acute cystitis without hematuria: Secondary | ICD-10-CM | POA: Diagnosis present

## 2020-11-24 DIAGNOSIS — R0600 Dyspnea, unspecified: Secondary | ICD-10-CM

## 2020-11-24 DIAGNOSIS — R21 Rash and other nonspecific skin eruption: Secondary | ICD-10-CM | POA: Diagnosis not present

## 2020-11-24 DIAGNOSIS — Z87891 Personal history of nicotine dependence: Secondary | ICD-10-CM

## 2020-11-24 DIAGNOSIS — Z79899 Other long term (current) drug therapy: Secondary | ICD-10-CM

## 2020-11-24 DIAGNOSIS — J9811 Atelectasis: Secondary | ICD-10-CM | POA: Diagnosis not present

## 2020-11-24 DIAGNOSIS — G20A1 Parkinson's disease without dyskinesia, without mention of fluctuations: Secondary | ICD-10-CM | POA: Diagnosis present

## 2020-11-24 LAB — CBC WITH DIFFERENTIAL/PLATELET
Abs Immature Granulocytes: 0.08 10*3/uL — ABNORMAL HIGH (ref 0.00–0.07)
Basophils Absolute: 0 10*3/uL (ref 0.0–0.1)
Basophils Relative: 0 %
Eosinophils Absolute: 0 10*3/uL (ref 0.0–0.5)
Eosinophils Relative: 0 %
HCT: 25.5 % — ABNORMAL LOW (ref 39.0–52.0)
Hemoglobin: 8.6 g/dL — ABNORMAL LOW (ref 13.0–17.0)
Immature Granulocytes: 1 %
Lymphocytes Relative: 3 %
Lymphs Abs: 0.3 10*3/uL — ABNORMAL LOW (ref 0.7–4.0)
MCH: 33.5 pg (ref 26.0–34.0)
MCHC: 33.7 g/dL (ref 30.0–36.0)
MCV: 99.2 fL (ref 80.0–100.0)
Monocytes Absolute: 0.7 10*3/uL (ref 0.1–1.0)
Monocytes Relative: 6 %
Neutro Abs: 10.3 10*3/uL — ABNORMAL HIGH (ref 1.7–7.7)
Neutrophils Relative %: 90 %
Platelets: 124 10*3/uL — ABNORMAL LOW (ref 150–400)
RBC: 2.57 MIL/uL — ABNORMAL LOW (ref 4.22–5.81)
RDW: 14 % (ref 11.5–15.5)
WBC: 11.4 10*3/uL — ABNORMAL HIGH (ref 4.0–10.5)
nRBC: 0 % (ref 0.0–0.2)

## 2020-11-24 LAB — COMPREHENSIVE METABOLIC PANEL
ALT: 5 U/L (ref 0–44)
AST: 36 U/L (ref 15–41)
Albumin: 3.2 g/dL — ABNORMAL LOW (ref 3.5–5.0)
Alkaline Phosphatase: 57 U/L (ref 38–126)
Anion gap: 6 (ref 5–15)
BUN: 37 mg/dL — ABNORMAL HIGH (ref 8–23)
CO2: 25 mmol/L (ref 22–32)
Calcium: 8 mg/dL — ABNORMAL LOW (ref 8.9–10.3)
Chloride: 102 mmol/L (ref 98–111)
Creatinine, Ser: 2.13 mg/dL — ABNORMAL HIGH (ref 0.61–1.24)
GFR, Estimated: 27 mL/min — ABNORMAL LOW (ref >60)
Glucose, Bld: 111 mg/dL — ABNORMAL HIGH (ref 70–99)
Potassium: 4.5 mmol/L (ref 3.5–5.1)
Sodium: 133 mmol/L — ABNORMAL LOW (ref 135–145)
Total Bilirubin: 1.1 mg/dL (ref 0.3–1.2)
Total Protein: 6.3 g/dL — ABNORMAL LOW (ref 6.5–8.1)

## 2020-11-24 LAB — URINALYSIS, ROUTINE W REFLEX MICROSCOPIC
Bilirubin Urine: NEGATIVE
Glucose, UA: NEGATIVE mg/dL
Ketones, ur: 5 mg/dL — AB
Nitrite: POSITIVE — AB
Protein, ur: 100 mg/dL — AB
RBC / HPF: >50 RBC/hpf — ABNORMAL HIGH (ref 0–5)
Specific Gravity, Urine: 1.011 (ref 1.005–1.030)
WBC, UA: >50 WBC/hpf — ABNORMAL HIGH (ref 0–5)
pH: 6 (ref 5.0–8.0)

## 2020-11-24 LAB — LACTIC ACID, PLASMA
Lactic Acid, Venous: 2.4 mmol/L (ref 0.5–1.9)
Lactic Acid, Venous: 1.4 mmol/L (ref 0.5–1.9)

## 2020-11-24 LAB — RESP PANEL BY RT-PCR (FLU A&B, COVID) ARPGX2
Influenza A by PCR: NEGATIVE
Influenza B by PCR: NEGATIVE
SARS Coronavirus 2 by RT PCR: NEGATIVE

## 2020-11-24 LAB — PROTIME-INR
INR: 1.1 (ref 0.8–1.2)
Prothrombin Time: 14.3 seconds (ref 11.4–15.2)

## 2020-11-24 LAB — MAGNESIUM: Magnesium: 2.1 mg/dL (ref 1.7–2.4)

## 2020-11-24 MED ORDER — ACETAMINOPHEN 650 MG RE SUPP
650.0000 mg | Freq: Once | RECTAL | Status: AC
  Administered 2020-11-24: 650 mg via RECTAL
  Filled 2020-11-24: qty 1

## 2020-11-24 MED ORDER — ACETAMINOPHEN 650 MG RE SUPP
650.0000 mg | Freq: Four times a day (QID) | RECTAL | Status: DC | PRN
  Filled 2020-11-24: qty 1

## 2020-11-24 MED ORDER — LACTATED RINGERS IV SOLN: INTRAVENOUS | Status: DC

## 2020-11-24 MED ORDER — ACETAMINOPHEN 325 MG PO TABS
650.0000 mg | ORAL_TABLET | Freq: Once | ORAL | Status: DC
  Filled 2020-11-24: qty 2

## 2020-11-24 MED ORDER — METRONIDAZOLE 500 MG/100ML IV SOLN
500.0000 mg | Freq: Once | INTRAVENOUS | Status: AC
  Administered 2020-11-24: 500 mg via INTRAVENOUS
  Filled 2020-11-24: qty 100

## 2020-11-24 MED ORDER — VANCOMYCIN HCL 10 G IV SOLR
1500.0000 mg | Freq: Once | INTRAVENOUS | Status: AC
  Administered 2020-11-24: 1500 mg via INTRAVENOUS
  Filled 2020-11-24 (×2): qty 1500

## 2020-11-24 MED ORDER — SODIUM CHLORIDE 0.9 % IV SOLN
1.0000 g | INTRAVENOUS | Status: DC
  Administered 2020-11-25: 1 g via INTRAVENOUS
  Filled 2020-11-24: qty 10

## 2020-11-24 MED ORDER — SODIUM CHLORIDE 0.9 % IV SOLN
2.0000 g | Freq: Once | INTRAVENOUS | Status: AC
  Administered 2020-11-24: 2 g via INTRAVENOUS
  Filled 2020-11-24: qty 2

## 2020-11-24 MED ORDER — LACTATED RINGERS IV SOLN: INTRAVENOUS | Status: AC

## 2020-11-24 MED ORDER — LACTATED RINGERS IV BOLUS (SEPSIS)
1000.0000 mL | Freq: Once | INTRAVENOUS | Status: AC
  Administered 2020-11-24: 1000 mL via INTRAVENOUS

## 2020-11-24 MED ORDER — VANCOMYCIN HCL IN DEXTROSE 1-5 GM/200ML-% IV SOLN: 1000.0000 mg | Freq: Once | INTRAVENOUS | Status: DC

## 2020-11-24 MED ORDER — ACETAMINOPHEN 325 MG PO TABS: 650.0000 mg | ORAL_TABLET | Freq: Four times a day (QID) | ORAL | Status: DC | PRN

## 2020-11-24 NOTE — ED Notes (Signed)
Pt placed on male primofit.

## 2020-11-24 NOTE — ED Notes (Addendum)
In and Out catheter yielded 476mL of dark red-brown urine

## 2020-11-24 NOTE — Progress Notes (Signed)
Notified bedside nurse of need to draw repeat lactic acid @ 1904.

## 2020-11-24 NOTE — ED Notes (Signed)
Pt unable to follow commands to complete swallow screen with water. Provider notified and suppository requested

## 2020-11-24 NOTE — ED Provider Notes (Signed)
[facility] Provider Note  CSN: [id number] Arrival date & time: 11/24/20 1647    History Chief Complaint  Patient presents with   Code Sepsis     HPI  [name] is a 87 y.o. male with history of prostate cancer, parkinson's who still lives at home independently with wife. She reports he had a fall in the bathroom earlier today. She was in a different room and couldn't hear him calling for help for about 21minutes but eventually she heard him, dragged him out of the bathroom but he was too weak and confused to get up. She reports he was in his usual state of health two days ago. He had some blood in his urine yesterday and today and this morning she reports he seemed confused. He is unable to provide much  additional history due to confusion but he denies pain.    Past Medical History:  Diagnosis Date   Allergy    rhinitis   Diabetes mellitus    dx 3-11, A1C 6.7   Gout    Hiatal hernia    Hyperlipidemia    Parkinson disease ([place]) 10/23/2015   Prostate CA ([place]) 2002   s/p  x-ray therapy and surgery   Skin cancer    Trigeminal neuralgia    h/o , status post gamma knife procedure and trial with multiple drugs    Past Surgical History:  Procedure Laterality Date   CHOLECYSTECTOMY      Family History  Problem Relation Age of Onset   Heart failure Mother    Neuropathy Sister    Diabetes Sister    Kidney failure Sister    Heart disease Brother    Heart disease Sister    Heart disease Brother    Heart disease Brother    Cancer Neg Hx        colon    Social History   Tobacco Use   Smoking status: Former    Pack years: 0.00   Smokeless tobacco: Never  Vaping Use   Vaping Use: Never used  Substance Use Topics   Alcohol use: No   Drug use: No     Home Medications Prior to Admission medications   Medication Sig Start Date End Date Taking? Authorizing Provider  Ascorbic Acid (VITAMIN C) 1000 MG tablet Take 1,000 mg by mouth daily.    Yes [provider]  Carbidopa-Levodopa ER (SINEMET CR) 25-100 MG tablet controlled release TAKE 1 AND 1/2 TABLETS BY MOUTH THREE TIMES DAILY 01/24/19  Yes Kathrynn Ducking, MD  Multiple Vitamin (MULTIVITAMIN) tablet Take 1 tablet by mouth daily. Centrum silver   Yes [provider]  simvastatin (ZOCOR) 40 MG tablet Take 1 tablet (40 mg total) by mouth at bedtime. 02/20/15  Yes Paz, Alda Berthold, MD  clotrimazole-betamethasone (LOTRISONE) cream apply to affected area twice a day topically Patient not taking: No sig reported 03/25/14   Colon Branch, MD     Allergies    Patient has no known allergies.   Review of Systems   Review of Systems Unable to assess due to mental status.    Physical Exam BP (!) 159/64   Pulse 90   Temp (!) 101 F (38.3 C) (Axillary)   Resp (!) 23   Ht 5\' 6"  (1.676 m)   Wt 65.8 kg   SpO2 98%   BMI 23.40 kg/m   Physical Exam Vitals and nursing note reviewed.  Constitutional:      Appearance: Normal  appearance.  HENT:     Head: Normocephalic and atraumatic.     Nose: Nose normal.     Mouth/Throat:     Mouth: Mucous membranes are dry.  Eyes:     General:        Right eye: Discharge present.        Left eye: No discharge.     Extraocular Movements: Extraocular movements intact.  Neck:     Comments: In c-collar Cardiovascular:     Rate and Rhythm: Normal rate.  Pulmonary:     Effort: Pulmonary effort is normal.     Breath sounds: Normal breath sounds.  Abdominal:     General: Abdomen is flat.     Palpations: Abdomen is soft.     Tenderness: There is abdominal tenderness (suprapubic). There is no guarding.  Genitourinary:    Comments: Uncircumcised, bloody urine in underwear Musculoskeletal:        General: No swelling. Normal range of motion.  Skin:    General: Skin is warm and dry.  Neurological:     General: No focal deficit present.     Mental Status: He is alert. He is disoriented.  Psychiatric:     Comments: Difficult to  assess     ED Results / Procedures / Treatments   Labs (all labs ordered are listed, but only abnormal results are displayed) Labs Reviewed  COMPREHENSIVE METABOLIC PANEL - Abnormal; Notable for the following components:      Result Value   Sodium 133 (*)    Glucose, Bld 111 (*)    BUN 37 (*)    Creatinine, Ser 2.13 (*)    Calcium 8.0 (*)    Total Protein 6.3 (*)    Albumin 3.2 (*)    GFR, Estimated 27 (*)    All other components within normal limits  LACTIC ACID, PLASMA - Abnormal; Notable for the following components:   Lactic Acid, Venous 2.4 (*)    All other components within normal limits  CBC WITH DIFFERENTIAL/PLATELET - Abnormal; Notable for the following components:   WBC 11.4 (*)    RBC 2.57 (*)    Hemoglobin 8.6 (*)    HCT 25.5 (*)    Platelets 124 (*)    Neutro Abs 10.3 (*)    Lymphs Abs 0.3 (*)    Abs Immature Granulocytes 0.08 (*)    All other components within normal limits  URINALYSIS, ROUTINE W REFLEX MICROSCOPIC - Abnormal; Notable for the following components:   Color, Urine RED (*)    APPearance HAZY (*)    Hgb urine dipstick LARGE (*)    Ketones, ur 5 (*)    Protein, ur 100 (*)    Nitrite POSITIVE (*)    Leukocytes,Ua MODERATE (*)    RBC / HPF >50 (*)    WBC, UA >50 (*)    Bacteria, UA MANY (*)    Crystals PRESENT (*)    All other components within normal limits  RESP PANEL BY RT-PCR (FLU A&B, COVID) ARPGX2  CULTURE, BLOOD (ROUTINE X 2)  CULTURE, BLOOD (ROUTINE X 2)  URINE CULTURE  LACTIC ACID, PLASMA  PROTIME-INR  APTT    EKG EKG Interpretation  Date/Time:  Monday November 24 2020 17:05:28 EDT Ventricular Rate:  79 PR Interval:  172 QRS Duration: 149 QT Interval:  400 QTC Calculation: 459 R Axis:   5 Text Interpretation: Sinus rhythm Left bundle branch block Since last tracing Left bundle branch block NOW PRESENT Confirmed by Karle Starch,  Juanda Crumble [id number]) on 11/24/2020 5:18:02 PM  Radiology CT Head Wo Contrast  Result Date:  11/24/2020 CLINICAL DATA:  Unwitnessed fall EXAM: CT HEAD WITHOUT CONTRAST CT CERVICAL SPINE WITHOUT CONTRAST TECHNIQUE: Multidetector CT imaging of the head and cervical spine was performed following the standard protocol without intravenous contrast. Multiplanar CT image reconstructions of the cervical spine were also generated. COMPARISON:  11/02/2006 FINDINGS: CT HEAD FINDINGS Brain: No evidence of acute infarction, hemorrhage, hydrocephalus, extra-axial collection or mass lesion/mass effect. Mild low-density changes within the periventricular and subcortical white matter compatible with chronic microvascular ischemic change. Mild diffuse cerebral volume loss. Vascular: Atherosclerotic calcifications involving the large vessels of the skull base. No unexpected hyperdense vessel. Skull: Normal. Negative for fracture or focal lesion. Sinuses/Orbits: Partial opacification of the left frontal sinus. Remaining paranasal sinuses and mastoid air cells are clear. Orbital structures within normal limits. Other: Negative for scalp hematoma. CT CERVICAL SPINE FINDINGS Alignment: Facet joints are aligned without dislocation or traumatic listhesis. Dens and lateral masses are aligned. Straightening of the cervical lordosis. Skull base and vertebrae: No acute fracture. No primary bone lesion or focal pathologic process. The right C2-3 facet joint is fused. Soft tissues and spinal canal: No prevertebral fluid or swelling. No visible canal hematoma. Disc levels: Multilevel degenerative disc disease, most severe at C5-6 where there is endplate osteophytosis and posterior longitudinal ligament ossification resulting in at least moderate canal stenosis. Multilevel facet and uncovertebral arthropathy is also present throughout the cervical spine. Upper chest: Negative. Other: Bilateral carotid atherosclerosis. IMPRESSION: 1. No evidence of acute intracranial process. 2. Mild chronic microvascular ischemic change and cerebral volume  loss. 3. No evidence of acute fracture or traumatic listhesis of the cervical spine. 4. Multilevel degenerative disc disease and facet arthropathy of the cervical spine, most severe at C5-6 where there is at least moderate canal stenosis. Electronically Signed   By: Davina Poke D.O.   On: 11/24/2020 18:09   CT Cervical Spine Wo Contrast  Result Date: 11/24/2020 CLINICAL DATA:  Unwitnessed fall EXAM: CT HEAD WITHOUT CONTRAST CT CERVICAL SPINE WITHOUT CONTRAST TECHNIQUE: Multidetector CT imaging of the head and cervical spine was performed following the standard protocol without intravenous contrast. Multiplanar CT image reconstructions of the cervical spine were also generated. COMPARISON:  11/02/2006 FINDINGS: CT HEAD FINDINGS Brain: No evidence of acute infarction, hemorrhage, hydrocephalus, extra-axial collection or mass lesion/mass effect. Mild low-density changes within the periventricular and subcortical white matter compatible with chronic microvascular ischemic change. Mild diffuse cerebral volume loss. Vascular: Atherosclerotic calcifications involving the large vessels of the skull base. No unexpected hyperdense vessel. Skull: Normal. Negative for fracture or focal lesion. Sinuses/Orbits: Partial opacification of the left frontal sinus. Remaining paranasal sinuses and mastoid air cells are clear. Orbital structures within normal limits. Other: Negative for scalp hematoma. CT CERVICAL SPINE FINDINGS Alignment: Facet joints are aligned without dislocation or traumatic listhesis. Dens and lateral masses are aligned. Straightening of the cervical lordosis. Skull base and vertebrae: No acute fracture. No primary bone lesion or focal pathologic process. The right C2-3 facet joint is fused. Soft tissues and spinal canal: No prevertebral fluid or swelling. No visible canal hematoma. Disc levels: Multilevel degenerative disc disease, most severe at C5-6 where there is endplate osteophytosis and posterior  longitudinal ligament ossification resulting in at least moderate canal stenosis. Multilevel facet and uncovertebral arthropathy is also present throughout the cervical spine. Upper chest: Negative. Other: Bilateral carotid atherosclerosis. IMPRESSION: 1. No evidence of acute intracranial process. 2. Mild chronic microvascular ischemic  change and cerebral volume loss. 3. No evidence of acute fracture or traumatic listhesis of the cervical spine. 4. Multilevel degenerative disc disease and facet arthropathy of the cervical spine, most severe at C5-6 where there is at least moderate canal stenosis. Electronically Signed   By: Davina Poke D.O.   On: 11/24/2020 18:09   DG Chest Port 1 View  Result Date: 11/24/2020 CLINICAL DATA:  Unwitnessed fall. Fever. History of prostate cancer. EXAM: PORTABLE CHEST 1 VIEW COMPARISON:  07/05/2017 FINDINGS: Shallow inspiration. Linear atelectasis in the lung bases. Heart size and pulmonary vascularity are normal for technique. No pleural effusions. No pneumothorax. Calcified and tortuous aorta. Degenerative changes in the spine and shoulders. IMPRESSION: Shallow inspiration with linear atelectasis in the lung bases. Electronically Signed   By: Lucienne Capers M.D.   On: 11/24/2020 17:56    Procedures .Critical Care  Date/Time: 11/24/2020 9:24 PM Performed by: Truddie Hidden, MD Authorized by: Truddie Hidden, MD   Critical care provider statement:    Critical care time (minutes):  45   Critical care time was exclusive of:  Separately billable procedures and treating other patients   Critical care was necessary to treat or prevent imminent or life-threatening deterioration of the following conditions:  Sepsis and renal failure   Critical care was time spent personally by me on the following activities:  Discussions with consultants, evaluation of patient's response to treatment, examination of patient, ordering and performing treatments and interventions,  ordering and review of laboratory studies, ordering and review of radiographic studies, pulse oximetry, re-evaluation of patient's condition, obtaining history from patient or surrogate and review of old charts  Medications Ordered in the ED Medications  lactated ringers infusion ( Intravenous New Bag/Given 11/24/20 1725)  vancomycin (VANCOCIN) 1,500 mg in sodium chloride 0.9 % 500 mL IVPB (1,500 mg Intravenous New Bag/Given 11/24/20 2009)  acetaminophen (TYLENOL) tablet 650 mg (650 mg Oral Not Given 11/24/20 2119)  acetaminophen (TYLENOL) suppository 650 mg (has no administration in time range)  lactated ringers bolus 1,000 mL (1,000 mLs Intravenous New Bag/Given 11/24/20 1724)  ceFEPIme (MAXIPIME) 2 g in sodium chloride 0.9 % 100 mL IVPB (0 g Intravenous Stopped 11/24/20 1817)  metroNIDAZOLE (FLAGYL) IVPB 500 mg (500 mg Intravenous New Bag/Given 11/24/20 1725)     MDM Rules/Calculators/A&P MDM  Patient brought by EMS after unwitnessed fall found to be confused and febrile. He has a history of cystitis and now with bloody urine. Will begin sepsis protocol, include imaging of head and neck for fall.   ED Course  I have reviewed the triage vital signs and the nursing notes.  Pertinent labs & imaging results that were available during my care of the patient were reviewed by me and considered in my medical decision making (see chart for details).  Clinical Course as of 11/24/20 2124  Mon Nov 24, 2020  1739 CBC with mild leukocytosis and moderate anemia worse from baseline. Not yet in need of transfusion but may be lower after hydration. CMP shows AKI compared to old values as well.  [CS]  1801 Lactic acid only mildly elevated. BP remains high, no signs of septic shock.  [CS]  2000 Covid/Flu neg.  [CS]  2040 Repeat Lactic is improved.  [CS]  2100 UA confirms source of infection. Will discuss with Hospitalist for admission.  [CS]  2118 Spoke with Dr. Velia Meyer, Hospitalist, who will evaluate for  admission.  [CS]    Clinical Course User Index [CS] Truddie Hidden, MD  Final Clinical Impression(s) / ED Diagnoses Final diagnoses:  Lower urinary tract infectious disease  Sepsis with acute renal failure without septic shock, due to unspecified organism, unspecified acute renal failure type [facility])  Metabolic encephalopathy    Rx / DC Orders ED Discharge Orders     None        Truddie Hidden, MD 11/24/20 2124

## 2020-11-24 NOTE — ED Triage Notes (Signed)
EMS called to pt home for an unwitnessed fall. No trauma noted. Typically, pt walks and does not fall. Pt temperature 102.3. Pt altered and "not making any sense" according to spouse. Pt does not speak [place].

## 2020-11-24 NOTE — H&P (Signed)
History and Physical    PLEASE NOTE THAT [employer]AGON DICTATION SOFTWARE WAS USED IN THE CONSTRUCTION OF THIS NOTE.   [name] TKW:[id number] DOB: 12-Oct-1918 DOA: 11/24/2020  PCP: Vernie Shanks, MD Patient coming from: home   I have personally briefly reviewed patient's old medical records in [place]  Chief Complaint: Confusion  HPI: [name] is a 87 y.o. male with medical history significant for Parkinson's disease prostate cancer status posttreatment with radioactive beads and prostatectomy, CKD 3A with baseline creatinine 1.4, who is admitted to [facility] on 11/24/2020 with severe sepsis due to urinary tract infection after presenting from home to [facility] ED evaluation of confusion.   In the setting of the patient's altered mental status, the following history is provided by the patient's wife, who is present at bedside, in addition to my discussions with the emergency department physician, and via chart review.  The patient's wife conveys that the patient has appeared confused and more somnolent relative to his baseline mental status and activity level over the last 2 days, noting baseline ambulatory status in which the patient and his wife typically walk 7 blocks per day local church.  He notes that patient has appeared more somnolent over the last 1 to 2 days as well, requiring additional sleep relative to his baseline requirements.  She conveys that he was also complaining of some generalized last few days in the absence of any acute focal weakness, numbness paresthesias slurred speech, facial droop, vertigo or acute change in vision.  She also reports that he has not been complaining of any recent headache, chest pain, shortness of breath, palpitations, diaphoresis, nausea/vomiting, diarrhea, abdominal pain, rash, cough or wheezing.  Over the last 2 days, in the setting of his confusion and somnolence, she notes that his oral intake has declined, noting significant decline  in his consumption of both food and fluids over that timeframe.  Earlier today, wife reports that the patient fell in their bedroom.  She conveys that she was in a different part of approximately 90 minutes, before moving into a room closer to the bedroom such that she was unable to hear the patient calling out for assistance.  She is unsure with time within the potential 90-minute window that the patient actually experienced and the mechanism of his fall was unclear.  It is uncertain if the patient lost consciousness as a component of this fall, the patient's wife confirms that she found the patient conscious, confused, consistent with his confusion over the last 2 days.  Upon finding the patient having fallen, wife contacted EMS who subsequently brought the patient to [facility] emergency department for further evaluation.  Per chart review, it appears that the patient has a history of prostate cancer, that appears to been diagnosed in 2002.  It appears that he subsequently underwent treatment via radioactive beads as well as prostatectomy.  He appears to follow with [facility] urology, with most recent note from their practice occurring in 2017.  In reviewing prior urinalysis results, it appears that, dating back to January 2021, urinalysis has been associated with hemoglobin and greater than 50 red blood cells with samples associated with red appearance.  He is not on any blood thinners at home.    ED Course:  Vital signs in the ED were notable for the following: Tetramex 102.2, heart rate 76-1 06; blood pressure 156/65 -167/79; respiratory rate 20-23; oxygen saturation 95 to 98% on room air.  Labs were notable for the  following: CMP notable for sodium 133 compared to 136 in January 2021, potassium 4.5, bicarbonate 25, BUN 37, creatinine 2.13 relative to most recent prior value of 1.4 in January 2021, glucose 111 albumin 3.2, but otherwise liver enzymes were found to be within normal limits.  CBC  notable for white blood cell count 11,400 with 90% neutrophils, hemoglobin 8.6 relative to most recent prior of 12.9 in January 2021, with presenting hemoglobin associated with normocytic/normochromic findings as well as nonelevated RDW, presenting platelet count 124.  Initial lactate 2.4, with repeat value trending down to 1.4 following interval IV fluids, as further detailed below.  INR 1.1.  Urinalysis showed greater than 50 white blood cells, nitrate positive, moderate leukocyte esterase, many bacteria, large hemoglobin, greater than 50 red blood cells, and 100 protein.  Nasopharyngeal COVID-19/influenza PCR were checked in the ED today and found to be negative.  Blood cultures x2 and urine culture were collected prior to initiation of antibiotics.  EKG showed sinus rhythm with left bundle branch block, heart rate 79, with nonspecific T wave inversion in lateral leads, without any EKG available within the last 9 years for point comparison.  Chest x-ray showed shallow inspiration with linear atelectasis in the lung bases, but demonstrated no evidence of overt infiltrate, edema, pneumothorax, or pleural effusion.  CT head showed no evidence of acute intracranial process, including no evidence of intracranial hemorrhage or acute infarct.  CT cervical spine showed no evidence of acute fracture or traumatic listhesis of the cervical spine.  While in the ED, the following were administered: Prior to availability of urinalysis, the patient was started on broad spectrum antibiotics in the form of cefepime, IV vancomycin, and Flagyl.  He also received acetaminophen 650 mg PR x1, lactated ringer bolus x1 L followed by transition to continuous LR at 150 cc/h.  Socially, the patient was admitted for further evaluation and management of severe sepsis due to suspected UTI with concomitant acute metabolic encephalopathy.     Review of Systems: As per HPI otherwise 10 point review of systems negative.   Past Medical  History:  Diagnosis Date   Allergy    rhinitis   Diabetes mellitus    dx 3-11, A1C 6.7   Gout    Hiatal hernia    Hyperlipidemia    Parkinson disease ([place]) 10/23/2015   Prostate CA ([place]) 2002   s/p  x-ray therapy and surgery   Skin cancer    Trigeminal neuralgia    h/o , status post gamma knife procedure and trial with multiple drugs    Past Surgical History:  Procedure Laterality Date   CHOLECYSTECTOMY      Social History:  reports that he has quit smoking. He has never used smokeless tobacco. He reports that he does not drink alcohol and does not use drugs.   No Known Allergies  Family History  Problem Relation Age of Onset   Heart failure Mother    Neuropathy Sister    Diabetes Sister    Kidney failure Sister    Heart disease Brother    Heart disease Sister    Heart disease Brother    Heart disease Brother    Cancer Neg Hx        colon    Family history reviewed and not pertinent    Prior to Admission medications   Medication Sig Start Date End Date Taking? Authorizing Provider  Ascorbic Acid (VITAMIN C) 1000 MG tablet Take 1,000 mg by mouth daily.   Yes  [provider]  Carbidopa-Levodopa ER (SINEMET CR) 25-100 MG tablet controlled release TAKE 1 AND 1/2 TABLETS BY MOUTH THREE TIMES DAILY 01/24/19  Yes Kathrynn Ducking, MD  Multiple Vitamin (MULTIVITAMIN) tablet Take 1 tablet by mouth daily. Centrum silver   Yes [provider]  simvastatin (ZOCOR) 40 MG tablet Take 1 tablet (40 mg total) by mouth at bedtime. 02/20/15  Yes Colon Branch, MD  clotrimazole-betamethasone (LOTRISONE) cream apply to affected area twice a day topically Patient not taking: No sig reported 03/25/14   Colon Branch, MD     Objective    Physical Exam: Vitals:   11/24/20 1800 11/24/20 1938 11/24/20 2030 11/24/20 2110  BP: (!) 184/83 (!) 156/65 (!) 159/64   Pulse: (!) 106 79 90   Resp: (!) 36 20 (!) 23   Temp:    (!) 101 F (38.3 C)  TempSrc:    Axillary  SpO2:  95% 97% 98%   Weight:      Height:        General: appears to be stated age; somnolent; will briefly open eyes to verbal stimuli, unable to follow instructions at this time but noted to be spontaneously moving all 4 extremities Skin: warm, dry, no rash Head:  AT/ Mouth:  Oral mucosa membranes appear dry, normal dentition Neck: supple; trachea midline Heart:  RRR; did not appreciate any M/R/G Lungs: CTAB, did not appreciate any wheezes, rales, or rhonchi Abdomen: + BS; soft, ND, NT Vascular: 2+ pedal pulses b/l; 2+ radial pulses b/l Extremities: no peripheral edema, no muscle wasting Neuro: in the setting of the patient's current mental status and associated inability to follow instructions, unable to perform full neurologic exam at this time.  As such, assessment of strength, sensation, and cranial nerves is limited at this time. Patient noted to spontaneously move all 4 extremities.      Labs on Admission: I have personally reviewed following labs and imaging studies  CBC: Recent Labs  Lab 11/24/20 1704  WBC 11.4*  NEUTROABS 10.3*  HGB 8.6*  HCT 25.5*  MCV 99.2  PLT 694*   Basic Metabolic Panel: Recent Labs  Lab 11/24/20 1704  NA 133*  K 4.5  CL 102  CO2 25  GLUCOSE 111*  BUN 37*  CREATININE 2.13*  CALCIUM 8.0*   GFR: Estimated Creatinine Clearance: 17.1 mL/min (A) (by C-G formula based on SCr of 2.13 mg/dL (H)). Liver Function Tests: Recent Labs  Lab 11/24/20 1704  AST 36  ALT 5  ALKPHOS 57  BILITOT 1.1  PROT 6.3*  ALBUMIN 3.2*   No results for input(s): LIPASE, AMYLASE in the last 168 hours. No results for input(s): AMMONIA in the last 168 hours. Coagulation Profile: Recent Labs  Lab 11/24/20 1704  INR 1.1   Cardiac Enzymes: No results for input(s): CKTOTAL, CKMB, CKMBINDEX, TROPONINI in the last 168 hours. BNP (last 3 results) No results for input(s): PROBNP in the last 8760 hours. HbA1C: No results for input(s): HGBA1C in the last 72  hours. CBG: No results for input(s): GLUCAP in the last 168 hours. Lipid Profile: No results for input(s): CHOL, HDL, LDLCALC, TRIG, CHOLHDL, LDLDIRECT in the last 72 hours. Thyroid Function Tests: No results for input(s): TSH, T4TOTAL, FREET4, T3FREE, THYROIDAB in the last 72 hours. Anemia Panel: No results for input(s): VITAMINB12, FOLATE, FERRITIN, TIBC, IRON, RETICCTPCT in the last 72 hours. Urine analysis:    Component Value Date/Time   COLORURINE RED (A) 11/24/2020 2030   APPEARANCEUR  HAZY (A) 11/24/2020 2030   LABSPEC 1.011 11/24/2020 2030   PHURINE 6.0 11/24/2020 2030   GLUCOSEU NEGATIVE 11/24/2020 2030   HGBUR LARGE (A) 11/24/2020 2030   HGBUR negative 06/09/2010 1115   BILIRUBINUR NEGATIVE 11/24/2020 2030   BILIRUBINUR small 03/31/2011 1211   KETONESUR 5 (A) 11/24/2020 2030   PROTEINUR 100 (A) 11/24/2020 2030   UROBILINOGEN 0.2 03/31/2011 1211   UROBILINOGEN 0.2 06/09/2010 1115   NITRITE POSITIVE (A) 11/24/2020 2030   LEUKOCYTESUR MODERATE (A) 11/24/2020 2030    Radiological Exams on Admission: CT Head Wo Contrast  Result Date: 11/24/2020 CLINICAL DATA:  Unwitnessed fall EXAM: CT HEAD WITHOUT CONTRAST CT CERVICAL SPINE WITHOUT CONTRAST TECHNIQUE: Multidetector CT imaging of the head and cervical spine was performed following the standard protocol without intravenous contrast. Multiplanar CT image reconstructions of the cervical spine were also generated. COMPARISON:  11/02/2006 FINDINGS: CT HEAD FINDINGS Brain: No evidence of acute infarction, hemorrhage, hydrocephalus, extra-axial collection or mass lesion/mass effect. Mild low-density changes within the periventricular and subcortical white matter compatible with chronic microvascular ischemic change. Mild diffuse cerebral volume loss. Vascular: Atherosclerotic calcifications involving the large vessels of the skull base. No unexpected hyperdense vessel. Skull: Normal. Negative for fracture or focal lesion. Sinuses/Orbits:  Partial opacification of the left frontal sinus. Remaining paranasal sinuses and mastoid air cells are clear. Orbital structures within normal limits. Other: Negative for scalp hematoma. CT CERVICAL SPINE FINDINGS Alignment: Facet joints are aligned without dislocation or traumatic listhesis. Dens and lateral masses are aligned. Straightening of the cervical lordosis. Skull base and vertebrae: No acute fracture. No primary bone lesion or focal pathologic process. The right C2-3 facet joint is fused. Soft tissues and spinal canal: No prevertebral fluid or swelling. No visible canal hematoma. Disc levels: Multilevel degenerative disc disease, most severe at C5-6 where there is endplate osteophytosis and posterior longitudinal ligament ossification resulting in at least moderate canal stenosis. Multilevel facet and uncovertebral arthropathy is also present throughout the cervical spine. Upper chest: Negative. Other: Bilateral carotid atherosclerosis. IMPRESSION: 1. No evidence of acute intracranial process. 2. Mild chronic microvascular ischemic change and cerebral volume loss. 3. No evidence of acute fracture or traumatic listhesis of the cervical spine. 4. Multilevel degenerative disc disease and facet arthropathy of the cervical spine, most severe at C5-6 where there is at least moderate canal stenosis. Electronically Signed   By: Davina Poke D.O.   On: 11/24/2020 18:09   CT Cervical Spine Wo Contrast  Result Date: 11/24/2020 CLINICAL DATA:  Unwitnessed fall EXAM: CT HEAD WITHOUT CONTRAST CT CERVICAL SPINE WITHOUT CONTRAST TECHNIQUE: Multidetector CT imaging of the head and cervical spine was performed following the standard protocol without intravenous contrast. Multiplanar CT image reconstructions of the cervical spine were also generated. COMPARISON:  11/02/2006 FINDINGS: CT HEAD FINDINGS Brain: No evidence of acute infarction, hemorrhage, hydrocephalus, extra-axial collection or mass lesion/mass effect.  Mild low-density changes within the periventricular and subcortical white matter compatible with chronic microvascular ischemic change. Mild diffuse cerebral volume loss. Vascular: Atherosclerotic calcifications involving the large vessels of the skull base. No unexpected hyperdense vessel. Skull: Normal. Negative for fracture or focal lesion. Sinuses/Orbits: Partial opacification of the left frontal sinus. Remaining paranasal sinuses and mastoid air cells are clear. Orbital structures within normal limits. Other: Negative for scalp hematoma. CT CERVICAL SPINE FINDINGS Alignment: Facet joints are aligned without dislocation or traumatic listhesis. Dens and lateral masses are aligned. Straightening of the cervical lordosis. Skull base and vertebrae: No acute fracture. No primary bone  lesion or focal pathologic process. The right C2-3 facet joint is fused. Soft tissues and spinal canal: No prevertebral fluid or swelling. No visible canal hematoma. Disc levels: Multilevel degenerative disc disease, most severe at C5-6 where there is endplate osteophytosis and posterior longitudinal ligament ossification resulting in at least moderate canal stenosis. Multilevel facet and uncovertebral arthropathy is also present throughout the cervical spine. Upper chest: Negative. Other: Bilateral carotid atherosclerosis. IMPRESSION: 1. No evidence of acute intracranial process. 2. Mild chronic microvascular ischemic change and cerebral volume loss. 3. No evidence of acute fracture or traumatic listhesis of the cervical spine. 4. Multilevel degenerative disc disease and facet arthropathy of the cervical spine, most severe at C5-6 where there is at least moderate canal stenosis. Electronically Signed   By: Davina Poke D.O.   On: 11/24/2020 18:09   DG Chest Port 1 View  Result Date: 11/24/2020 CLINICAL DATA:  Unwitnessed fall. Fever. History of prostate cancer. EXAM: PORTABLE CHEST 1 VIEW COMPARISON:  07/05/2017 FINDINGS:  Shallow inspiration. Linear atelectasis in the lung bases. Heart size and pulmonary vascularity are normal for technique. No pleural effusions. No pneumothorax. Calcified and tortuous aorta. Degenerative changes in the spine and shoulders. IMPRESSION: Shallow inspiration with linear atelectasis in the lung bases. Electronically Signed   By: Lucienne Capers M.D.   On: 11/24/2020 17:56     EKG: Independently reviewed, with result as described above.    Assessment/Plan   Diyari Cherne is a 87 y.o. male with medical history significant for Parkinson's disease prostate cancer status posttreatment with radioactive beads and prostatectomy, CKD 3A with baseline creatinine 1.4, who is admitted to [facility] on 11/24/2020 with severe sepsis due to urinary tract infection after presenting from home to [facility] ED evaluation of confusion.    Principal Problem:   Severe sepsis ([place]) Active Problems:   DM2 (diabetes mellitus, type 2) ([place])   HLD (hyperlipidemia)   Anemia   Parkinson disease ([facility])   Acute cystitis   Acute metabolic encephalopathy   Lactic acidosis   Acute kidney injury superimposed on CKD ([facility])   Generalized weakness   Acute hyponatremia      #) Severe sepsis due to urinary tract infection: Diagnosis on the basis of presenting with fever, tachycardia, and leukocytosis with urinalysis suggestive of underlying urinary tract infection in the setting of the following findings: significant pyuria, nitrate positive/moderate leukocyte Estrace as well as many bacteria.  This is in the context of presenting acute confusion and somnolence over the last 2 days.  Patient's sepsis meets criteria to be considered severe in nature on the basis of presenting suspected associated acute encephalopathy as well as evidence of endorgan damage in the form of presenting elevated lactic acid 2.4, which is subsequently improved to 1.4 following interval IV fluids, as further described below.  Otherwise, no  evidence of additional underlying infectious process at this time, including COVID-19/influenza PCR, which were performed in the ED today, and found to be negative.  Additionally chest x-ray shows no evidence of acute cardiopulmonary process.   Prior to availability of the urinalysis results, the patient was started on broad-spectrum antibiotics in the ED in the form of cefepime, Flagyl, and vancomycin.  Interval urinalysis results and suspected underlying UTI without evidence of additional infectious source, will narrow this to Rocephin.  Blood cultures x2 and urine sample were sent for culture prior to initiation of the above antibiotics.  In the absence of hypotension or elevated lactate of greater than or equal to  4.0, there is no current indication for administration of a 30 mL/kg IVF bolus.  Of note, patient did receive a 1 L LR bolus followed by transition to continuous LR at 150 cc/h.  Given interval improvement in lactate as well as patient's apparent hemodynamic stability at this time, will reduce rate of continuous IV fluids to 75/h, particularly in the setting of the patient's age.   Plan: LR at 75 cc/h, as above.  Follow-up results of blood and urine cultures collected in the ED today.  Rocephin.  Repeat CBC with differential in the morning.  As needed acetaminophen for additional fever.  Monitor on telemetry.      #) Acute metabolic encephalopathy: 2 days of confusion and somnolence relative to baseline mental status.  Patient's acute encephalopathy appears to most likely be on the basis of physiologic stressors stemming from presenting severe sepsis due to UTI .  No obvious additional contributory underlying infectious process at this time, including negative COVID-19/influenza PCR performed today as well as CXR showed no evidence of acute cardiopulmonary process.  No obvious contributory pharmacologic factors.  Potential additional metabolic contributions include secondary dehydration in  the setting of diminished oral intake over the last 2 days following onset of confusion as well as AKI superimposed on stage IIIb chronic kidney disease.  Acute CVA is felt to be less likely at this time as the patient appears to be moving all 4 extremities spontaneously, although unable to perform full neurologic assessment at this time given the patient's current mental status and inability to follow instructions.  Of note, CT head showed no evidence of acute intracranial process, including no evidence of intracranial hemorrhage.  Seizures are felt to be less likely at this time.  We will keep patient n.p.o. until mental status improves sufficiently that he is able to participate in and pass nursing bedside swallow evaluation.   Plan: NPO. Nursing bedside swallow evaluation prior to the initiation of a diet/oral medications, as described above. fall precautions. CMP in the morning. Repeat CBC in the morning. check VBG to evaluate for any contribution from hypercapnic encephalopathy. Check TSH, MMA, ammonia.  Further work-up and management of presenting severe sepsis due to UTI, including IV antibiotics, as above.  Recommend management of AKI, as above.  Gentle IV fluids overnight, as above.       #) Acute kidney injury superimposed on stage IIIa chronic kidney disease: In the setting of apparent baseline creatinine of 1.4, with most recent prior value from January 2021, the patient presents with creatinine of 2.13.  Suspect a degree of prerenal influence setting of intravascular depletion from diminished oral intake over the last 2 days as well as additional prerenal contribution from intravascular depletion presenting severe sepsis.  The chronicity of the patient's anemia noted on this evening's labs is not clear.  Depending upon the acuity of this process, there may also be a contribution from diminished renal perfusion as a consequence of associated relative decline in oxygen carrying/delivery  capacity in the setting of presenting anemia.  Urinalysis was notable for greater than 50 white blood cells, greater than 50 red blood cells, and 100 protein, as further detailed above.  Plan: Lactated Ringer's at 75 cc/h x 12 hours.  Monitor strict I's and O's and daily weights.  Tempt avoid nephrotoxic agents.  Repeat CMP in the morning.  Add on random urine sodium as well as random urine creatinine.  Additionally, given protein seen in this evening's urinalysis, will also add on protein  to creatinine ratio.  Further eval and management of severe sepsis due to UTI.  Evaluation of anemia, as further detailed below.       #) Acute hyponatremia: Presenting serum sodium noted to be 133 compared to most recent prior value 136 in January 2021.  Suspect some degree of contribution from dehydration given limited oral intake over the last 2 days in the setting of severe sepsis due to urinary tract infection.  We will provide gentle IV fluids overnight, with plan to repeat CMP in the morning.  If hyponatremia persistent or worsening in spite of IV fluid resuscitative efforts, expanding work-up above acute hyponatremia at that point.  Of note, TSH is being checked in the setting of presenting acute metabolic encephalopathy.   Plan: Continuous IV fluids overnight, as above.  Monitor strict I's and O's and daily weights.  Repeat CMP in the morning.  Follow for results of TSH, as above.      #) Anemia: Presenting hemoglobin found to be 8.6.  Unclear chronicity of this finding, as most recent prior hemoglobin noted to have occurred in January 2021, at which time hemoglobin was found to be 12.9.  As a consequence of no interval data points over the ensuing 18 months.  Presenting hemoglobin represents an acute finding versus subacute lower chronic anemia that has developed subsequent to January 2021.  Interestingly per chart review, a urinalysis dating back to January 2021 have shown moderate hemoglobin with  greater than 50 red appearance of the associated specimen.  Today's urinalysis appears consistent with these findings dating back to January 2021.  Of note, INR found to be nonelevated.  Today's hemoglobin value is associated with normocytic/normochromic findings with nonelevated RDW, which appears less consistent with that of blood loss anemia.  Not on any blood thinners at home, including no aspirin.  Plan: Add on the following lab studies: Iron studies, reticulocyte count, peripheral smear, MMA, folic acid.  Repeat INR and CBC in the morning.  Type and screen.  Given uncertain acuity he is anemic finding, however every 4 hour H&H checks through 9 AM tomorrow morning.  Monitor on telemetry.  Monitor continuous pulse oximetry.  SCDs.  Refraining from pharmacologic DVT prophylaxis for now.      #) Type 2 diabetes mellitus: Documented history of such which previously managed via lifestyle modifications at home, patient is on no oral hypoglycemic agents nor any insulin at home.  Presenting blood sugar noted to be 111.  Most recent prior hemoglobin A1c found to be 6.7% in September 2015.  Plan: In the setting of current n.p.o. status, have ordered every 6 hours Accu-Cheks with low-dose sliding scale insulin.       #) Hyperlipidemia: On simvastatin as an outpatient.  Plan: In the setting of current n.p.o. status, hold home statin for now.       #) Parkinson's disease: On levodopa/carbidopa as an outpatient.  Plan: Holding home levodopa/carbidopa for now in the context of current n.p.o. status.     DVT prophylaxis: scd's  Code Status: Full code: Per my discussions with the patient's wife this evening Family Communication: I discussed the patient's case with his wife, who is present at bedside Disposition Plan: Per Rounding Team Consults called: none  Admission status: Inpatient; med telemetry     Of note, this patient was added by me to the following Admit List/Treatment Team:  wladmits.      PLEASE NOTE THAT DRAGON DICTATION SOFTWARE WAS USED IN THE CONSTRUCTION OF THIS NOTE.  [facility] Pager [phone] From [place]  Otherwise, please contact night-coverage  [email or web address] [facility]   11/24/2020, 9:32 PM

## 2020-11-24 NOTE — Progress Notes (Signed)
Elink following for code sepsis 

## 2020-11-24 NOTE — Progress Notes (Signed)
Brief note regarding plan, with full H&P to follow:  88 year old male with history of Parkinson's disease who is admitted with acute metabolic encephalopathy in the setting of severe sepsis due to urinary tract infection presenting from home for evaluation of 2 days of confusion preceded a fall in the bathroom earlier today.  Noted to be febrile on presentation with mild tachycardia, and urinalysis reportedly consistent with UTI.  Initial lactate 2.4, with repeat value following interval IV fluids trending down to 1.4.  Received broad-spectrum antibiotics in ED prior to identification of underlying infectious source, with the patient initially receiving cefepime, Flagyl, and IV vancomycin.  In setting of suspected urinary source, will proceed with Rocephin.  Monitor for results of blood and urine cultures.  N.p.o. until patient's mental status has improved sufficiently that he is able to pass nursing bedside swallow screen.      Babs Bertin, DO Hospitalist

## 2020-11-24 NOTE — Progress Notes (Signed)
A consult was received from an ED physician for vancomycin & cefepime per pharmacy dosing.  The patient's profile has been reviewed for ht/wt/allergies/indication/available labs.   A one time order has been placed for vancomycin 1500 mg & cefepime 2 gm.    Further antibiotics/pharmacy consults should be ordered by admitting physician if indicated.                       Thank you,  Eudelia Bunch, Pharm.D 11/24/2020 5:18 PM

## 2020-11-25 ENCOUNTER — Inpatient Hospital Stay (HOSPITAL_COMMUNITY): Payer: Medicare PPO

## 2020-11-25 DIAGNOSIS — N179 Acute kidney failure, unspecified: Secondary | ICD-10-CM | POA: Diagnosis present

## 2020-11-25 DIAGNOSIS — E872 Acidosis, unspecified: Secondary | ICD-10-CM | POA: Diagnosis present

## 2020-11-25 DIAGNOSIS — A419 Sepsis, unspecified organism: Secondary | ICD-10-CM | POA: Diagnosis not present

## 2020-11-25 DIAGNOSIS — N189 Chronic kidney disease, unspecified: Secondary | ICD-10-CM | POA: Diagnosis present

## 2020-11-25 DIAGNOSIS — R6520 Severe sepsis without septic shock: Secondary | ICD-10-CM | POA: Diagnosis not present

## 2020-11-25 DIAGNOSIS — R531 Weakness: Secondary | ICD-10-CM

## 2020-11-25 DIAGNOSIS — E871 Hypo-osmolality and hyponatremia: Secondary | ICD-10-CM | POA: Diagnosis present

## 2020-11-25 DIAGNOSIS — N3000 Acute cystitis without hematuria: Secondary | ICD-10-CM | POA: Diagnosis present

## 2020-11-25 DIAGNOSIS — G9341 Metabolic encephalopathy: Secondary | ICD-10-CM | POA: Diagnosis present

## 2020-11-25 LAB — BLOOD GAS, ARTERIAL
Acid-base deficit: 5 mmol/L — ABNORMAL HIGH (ref 0.0–2.0)
Bicarbonate: 18.7 mmol/L — ABNORMAL LOW (ref 20.0–28.0)
O2 Saturation: 95.8 %
Patient temperature: 100
pCO2 arterial: 33.1 mmHg (ref 32.0–48.0)
pH, Arterial: 7.375 (ref 7.350–7.450)
pO2, Arterial: 95.5 mmHg (ref 83.0–108.0)

## 2020-11-25 LAB — PROTIME-INR
INR: 1.4 — ABNORMAL HIGH (ref 0.8–1.2)
Prothrombin Time: 16.7 seconds — ABNORMAL HIGH (ref 11.4–15.2)

## 2020-11-25 LAB — BLOOD CULTURE ID PANEL (REFLEXED) - BCID2
A.calcoaceticus-baumannii: NOT DETECTED
A.calcoaceticus-baumannii: NOT DETECTED
Bacteroides fragilis: NOT DETECTED
Bacteroides fragilis: NOT DETECTED
CTX-M ESBL: NOT DETECTED
CTX-M ESBL: NOT DETECTED
Candida albicans: NOT DETECTED
Candida albicans: NOT DETECTED
Candida auris: NOT DETECTED
Candida auris: NOT DETECTED
Candida glabrata: NOT DETECTED
Candida glabrata: NOT DETECTED
Candida krusei: NOT DETECTED
Candida krusei: NOT DETECTED
Candida parapsilosis: NOT DETECTED
Candida parapsilosis: NOT DETECTED
Candida tropicalis: NOT DETECTED
Candida tropicalis: NOT DETECTED
Carbapenem resist OXA 48 LIKE: NOT DETECTED
Carbapenem resist OXA 48 LIKE: NOT DETECTED
Carbapenem resistance IMP: NOT DETECTED
Carbapenem resistance IMP: NOT DETECTED
Carbapenem resistance KPC: NOT DETECTED
Carbapenem resistance KPC: NOT DETECTED
Carbapenem resistance NDM: NOT DETECTED
Carbapenem resistance NDM: NOT DETECTED
Carbapenem resistance VIM: NOT DETECTED
Carbapenem resistance VIM: NOT DETECTED
Cryptococcus neoformans/gattii: NOT DETECTED
Cryptococcus neoformans/gattii: NOT DETECTED
Enterobacter cloacae complex: DETECTED — AB
Enterobacter cloacae complex: DETECTED — AB
Enterobacterales: DETECTED — AB
Enterobacterales: DETECTED — AB
Enterococcus Faecium: NOT DETECTED
Enterococcus Faecium: NOT DETECTED
Enterococcus faecalis: NOT DETECTED
Enterococcus faecalis: NOT DETECTED
Escherichia coli: NOT DETECTED
Escherichia coli: NOT DETECTED
Haemophilus influenzae: NOT DETECTED
Haemophilus influenzae: NOT DETECTED
Klebsiella aerogenes: NOT DETECTED
Klebsiella aerogenes: NOT DETECTED
Klebsiella oxytoca: NOT DETECTED
Klebsiella oxytoca: NOT DETECTED
Klebsiella pneumoniae: NOT DETECTED
Klebsiella pneumoniae: NOT DETECTED
Listeria monocytogenes: NOT DETECTED
Listeria monocytogenes: NOT DETECTED
Methicillin resistance mecA/C: DETECTED — AB
Neisseria meningitidis: NOT DETECTED
Neisseria meningitidis: NOT DETECTED
Proteus species: NOT DETECTED
Proteus species: NOT DETECTED
Pseudomonas aeruginosa: NOT DETECTED
Pseudomonas aeruginosa: NOT DETECTED
Salmonella species: NOT DETECTED
Salmonella species: NOT DETECTED
Serratia marcescens: NOT DETECTED
Serratia marcescens: NOT DETECTED
Staphylococcus aureus (BCID): NOT DETECTED
Staphylococcus aureus (BCID): NOT DETECTED
Staphylococcus epidermidis: NOT DETECTED
Staphylococcus epidermidis: DETECTED — AB
Staphylococcus lugdunensis: NOT DETECTED
Staphylococcus lugdunensis: NOT DETECTED
Staphylococcus species: NOT DETECTED
Staphylococcus species: DETECTED — AB
Stenotrophomonas maltophilia: NOT DETECTED
Stenotrophomonas maltophilia: NOT DETECTED
Streptococcus agalactiae: NOT DETECTED
Streptococcus agalactiae: NOT DETECTED
Streptococcus pneumoniae: NOT DETECTED
Streptococcus pneumoniae: NOT DETECTED
Streptococcus pyogenes: NOT DETECTED
Streptococcus pyogenes: NOT DETECTED
Streptococcus species: NOT DETECTED
Streptococcus species: NOT DETECTED

## 2020-11-25 LAB — COMPREHENSIVE METABOLIC PANEL
ALT: 8 U/L (ref 0–44)
ALT: 14 U/L (ref 0–44)
AST: 40 U/L (ref 15–41)
AST: 46 U/L — ABNORMAL HIGH (ref 15–41)
Albumin: 2.6 g/dL — ABNORMAL LOW (ref 3.5–5.0)
Albumin: 2.8 g/dL — ABNORMAL LOW (ref 3.5–5.0)
Alkaline Phosphatase: 48 U/L (ref 38–126)
Alkaline Phosphatase: 61 U/L (ref 38–126)
Anion gap: 8 (ref 5–15)
Anion gap: 11 (ref 5–15)
BUN: 39 mg/dL — ABNORMAL HIGH (ref 8–23)
BUN: 42 mg/dL — ABNORMAL HIGH (ref 8–23)
CO2: 24 mmol/L (ref 22–32)
CO2: 20 mmol/L — ABNORMAL LOW (ref 22–32)
Calcium: 7.7 mg/dL — ABNORMAL LOW (ref 8.9–10.3)
Calcium: 8 mg/dL — ABNORMAL LOW (ref 8.9–10.3)
Chloride: 102 mmol/L (ref 98–111)
Chloride: 104 mmol/L (ref 98–111)
Creatinine, Ser: 1.8 mg/dL — ABNORMAL HIGH (ref 0.61–1.24)
Creatinine, Ser: 1.86 mg/dL — ABNORMAL HIGH (ref 0.61–1.24)
GFR, Estimated: 33 mL/min — ABNORMAL LOW (ref >60)
Glucose, Bld: 97 mg/dL (ref 70–99)
Glucose, Bld: 80 mg/dL (ref 70–99)
Potassium: 4 mmol/L (ref 3.5–5.1)
Potassium: 4.5 mmol/L (ref 3.5–5.1)
Sodium: 134 mmol/L — ABNORMAL LOW (ref 135–145)
Sodium: 135 mmol/L (ref 135–145)
Total Bilirubin: 0.9 mg/dL (ref 0.3–1.2)
Total Bilirubin: 1 mg/dL (ref 0.3–1.2)
Total Protein: 5.7 g/dL — ABNORMAL LOW (ref 6.5–8.1)
Total Protein: 6.5 g/dL (ref 6.5–8.1)

## 2020-11-25 LAB — CBC WITH DIFFERENTIAL/PLATELET
Abs Immature Granulocytes: 0.24 10*3/uL — ABNORMAL HIGH (ref 0.00–0.07)
Basophils Absolute: 0 10*3/uL (ref 0.0–0.1)
Basophils Relative: 0 %
Eosinophils Absolute: 0 10*3/uL (ref 0.0–0.5)
Eosinophils Relative: 0 %
HCT: 24.8 % — ABNORMAL LOW (ref 39.0–52.0)
Hemoglobin: 8.1 g/dL — ABNORMAL LOW (ref 13.0–17.0)
Immature Granulocytes: 2 %
Lymphocytes Relative: 3 %
Lymphs Abs: 0.3 10*3/uL — ABNORMAL LOW (ref 0.7–4.0)
MCH: 32.5 pg (ref 26.0–34.0)
MCHC: 32.7 g/dL (ref 30.0–36.0)
MCV: 99.6 fL (ref 80.0–100.0)
Monocytes Absolute: 0.5 10*3/uL (ref 0.1–1.0)
Monocytes Relative: 5 %
Neutro Abs: 9.1 10*3/uL — ABNORMAL HIGH (ref 1.7–7.7)
Neutrophils Relative %: 90 %
Platelets: 104 10*3/uL — ABNORMAL LOW (ref 150–400)
RBC: 2.49 MIL/uL — ABNORMAL LOW (ref 4.22–5.81)
RDW: 14.3 % (ref 11.5–15.5)
WBC: 10.1 10*3/uL (ref 4.0–10.5)
nRBC: 0 % (ref 0.0–0.2)

## 2020-11-25 LAB — IRON AND TIBC
Iron: 10 ug/dL — ABNORMAL LOW (ref 45–182)
Saturation Ratios: 5 % — ABNORMAL LOW (ref 17.9–39.5)
TIBC: 212 ug/dL — ABNORMAL LOW (ref 250–450)
UIBC: 202 ug/dL

## 2020-11-25 LAB — BLOOD GAS, VENOUS
Acid-base deficit: 0.9 mmol/L (ref 0.0–2.0)
Bicarbonate: 23.8 mmol/L (ref 20.0–28.0)
O2 Saturation: 27.8 %
Patient temperature: 98.6
pCO2, Ven: 42.8 mmHg — ABNORMAL LOW (ref 44.0–60.0)
pH, Ven: 7.365 (ref 7.250–7.430)
pO2, Ven: 24 mmHg — CL (ref 32.0–45.0)

## 2020-11-25 LAB — CBC
HCT: 30.8 % — ABNORMAL LOW (ref 39.0–52.0)
Hemoglobin: 10.2 g/dL — ABNORMAL LOW (ref 13.0–17.0)
MCH: 32.9 pg (ref 26.0–34.0)
MCHC: 33.1 g/dL (ref 30.0–36.0)
MCV: 99.4 fL (ref 80.0–100.0)
Platelets: 128 10*3/uL — ABNORMAL LOW (ref 150–400)
RBC: 3.1 MIL/uL — ABNORMAL LOW (ref 4.22–5.81)
RDW: 14.4 % (ref 11.5–15.5)
WBC: 15 10*3/uL — ABNORMAL HIGH (ref 4.0–10.5)
nRBC: 0 % (ref 0.0–0.2)

## 2020-11-25 LAB — CBG MONITORING, ED
Glucose-Capillary: 90 mg/dL (ref 70–99)
Glucose-Capillary: 83 mg/dL (ref 70–99)

## 2020-11-25 LAB — RETICULOCYTES
Immature Retic Fract: 20.5 % — ABNORMAL HIGH (ref 2.3–15.9)
RBC.: 2.4 MIL/uL — ABNORMAL LOW (ref 4.22–5.81)
Retic Count, Absolute: 47.3 10*3/uL (ref 19.0–186.0)
Retic Ct Pct: 2 % (ref 0.4–3.1)

## 2020-11-25 LAB — D-DIMER, QUANTITATIVE: D-Dimer, Quant: 6.02 ug/mL-FEU — ABNORMAL HIGH (ref 0.00–0.50)

## 2020-11-25 LAB — GLUCOSE, CAPILLARY
Glucose-Capillary: 64 mg/dL — ABNORMAL LOW (ref 70–99)
Glucose-Capillary: 90 mg/dL (ref 70–99)
Glucose-Capillary: 86 mg/dL (ref 70–99)

## 2020-11-25 LAB — HEMOGLOBIN AND HEMATOCRIT, BLOOD
HCT: 23.9 % — ABNORMAL LOW (ref 39.0–52.0)
HCT: 27.4 % — ABNORMAL LOW (ref 39.0–52.0)
Hemoglobin: 7.9 g/dL — ABNORMAL LOW (ref 13.0–17.0)
Hemoglobin: 9.1 g/dL — ABNORMAL LOW (ref 13.0–17.0)

## 2020-11-25 LAB — APTT: aPTT: 37 seconds — ABNORMAL HIGH (ref 24–36)

## 2020-11-25 LAB — FERRITIN: Ferritin: 99 ng/mL (ref 24–336)

## 2020-11-25 LAB — MAGNESIUM: Magnesium: 2 mg/dL (ref 1.7–2.4)

## 2020-11-25 LAB — TSH: TSH: 2.62 u[IU]/mL (ref 0.350–4.500)

## 2020-11-25 LAB — TROPONIN I (HIGH SENSITIVITY): Troponin I (High Sensitivity): 63 ng/L — ABNORMAL HIGH (ref <18)

## 2020-11-25 LAB — AMMONIA: Ammonia: 11 umol/L (ref 9–35)

## 2020-11-25 MED ORDER — SODIUM CHLORIDE 0.9 % IV SOLN
2.0000 g | INTRAVENOUS | Status: DC
  Administered 2020-11-25 – 2020-11-27 (×3): 2 g via INTRAVENOUS
  Filled 2020-11-25 (×4): qty 2

## 2020-11-25 MED ORDER — METOPROLOL TARTRATE 5 MG/5ML IV SOLN
5.0000 mg | INTRAVENOUS | Status: AC | PRN
  Administered 2020-11-25: 5 mg via INTRAVENOUS
  Filled 2020-11-25: qty 5

## 2020-11-25 MED ORDER — LACTATED RINGERS IV SOLN: INTRAVENOUS | Status: AC

## 2020-11-25 MED ORDER — DILTIAZEM HCL 25 MG/5ML IV SOLN
10.0000 mg | Freq: Once | INTRAVENOUS | Status: AC
  Administered 2020-11-25: 10 mg via INTRAVENOUS
  Filled 2020-11-25: qty 5

## 2020-11-25 MED ORDER — FUROSEMIDE 10 MG/ML IJ SOLN
INTRAMUSCULAR | Status: AC
  Administered 2020-11-25: 20 mg via INTRAVENOUS
  Filled 2020-11-25: qty 2

## 2020-11-25 MED ORDER — INSULIN ASPART 100 UNIT/ML IJ SOLN
0.0000 [IU] | Freq: Four times a day (QID) | INTRAMUSCULAR | Status: DC
  Administered 2020-11-28: 1 [IU] via SUBCUTANEOUS
  Filled 2020-11-25: qty 0.06

## 2020-11-25 MED ORDER — METOPROLOL TARTRATE 5 MG/5ML IV SOLN
INTRAVENOUS | Status: AC
  Filled 2020-11-25: qty 5

## 2020-11-25 MED ORDER — FUROSEMIDE 10 MG/ML IJ SOLN
20.0000 mg | INTRAMUSCULAR | Status: AC
  Administered 2020-11-25: 20 mg via INTRAVENOUS

## 2020-11-25 NOTE — Significant Event (Signed)
Rapid Response Event Note   Reason for Call : elevated MEWS    Initial Focused Assessment: upon assessment patient has visible tremors, wheezing, appears to be in respiratory distress with tachypnea. RR in the 20's, HR 120's a-fib, BP 101/64 (76), temp 100.2. Dr. Zigmund Daniel at bedside ordred ABG, 20mg  lasix, and a chest xray        Interventions:  Lasix administered. Patient noted to rest after the lasix administration but HR jumped up as high a 160's in a-fib. EKG obtained reading a-fib w/ RVR. Cardizem administered. Rate controlled in a-fib after administration. Tremor resolved on it's own    Plan of Care:  Continue to monitor on 6th floor, nurses instructed to notify for any signs of distress or worsening vitals   Event Summary:   MD Notified: [email or web address] Call Time: [place] Time: [place] Time: [place]  Clarene Critchley, RN

## 2020-11-25 NOTE — ED Notes (Signed)
Patient appears to be sleeping when checked frequently.  The patient arouses when spoken to or touched.  He mumbles but speech is rambling.

## 2020-11-25 NOTE — ED Notes (Signed)
Secure Chat message sent to Chinita Greenland, RN  In regards to admission information and room assignment 614.

## 2020-11-25 NOTE — Progress Notes (Addendum)
PHARMACY - PHYSICIAN COMMUNICATION CRITICAL VALUE ALERT - BLOOD CULTURE IDENTIFICATION (BCID)  [name] is an 87 y.o. male who presented to [facility] on [facility]/24/2020 with a chief complaint of confusion  Assessment:   87 yo M presents with confusion. 4/4 bottles showing GNR, BCID reporting enterobacter cloacae. Also one blood set showing GPC in 1 bottle, micro to follow up.  Update: Micro lab called and it is also 1/4 with staph epi, mec A detected. Probable contaminant, would continue treatment for enterobacter cloacae.  Name of physician (or Provider) Contacted: Jacki Cones MD  Current antibiotics: Ceftriaxone  Changes to prescribed antibiotics recommended:  Stop ceftriaxone  Start cefepime 2g IV Q24h  Results for orders placed or performed during the hospital encounter of 11/24/20  Blood Culture ID Panel (Reflexed) (Collected: 11/24/2020  5:04 PM)  Result Value Ref Range   Enterococcus faecalis NOT DETECTED NOT DETECTED   Enterococcus Faecium NOT DETECTED NOT DETECTED   Listeria monocytogenes NOT DETECTED NOT DETECTED   Staphylococcus species NOT DETECTED NOT DETECTED   Staphylococcus aureus (BCID) NOT DETECTED NOT DETECTED   Staphylococcus epidermidis NOT DETECTED NOT DETECTED   Staphylococcus lugdunensis NOT DETECTED NOT DETECTED   Streptococcus species NOT DETECTED NOT DETECTED   Streptococcus agalactiae NOT DETECTED NOT DETECTED   Streptococcus pneumoniae NOT DETECTED NOT DETECTED   Streptococcus pyogenes NOT DETECTED NOT DETECTED   A.calcoaceticus-baumannii NOT DETECTED NOT DETECTED   Bacteroides fragilis NOT DETECTED NOT DETECTED   Enterobacterales DETECTED (A) NOT DETECTED   Enterobacter cloacae complex DETECTED (A) NOT DETECTED   Escherichia coli NOT DETECTED NOT DETECTED   Klebsiella aerogenes NOT DETECTED NOT DETECTED   Klebsiella oxytoca NOT DETECTED NOT DETECTED   Klebsiella pneumoniae NOT DETECTED NOT DETECTED   Proteus species NOT DETECTED NOT  DETECTED   Salmonella species NOT DETECTED NOT DETECTED   Serratia marcescens NOT DETECTED NOT DETECTED   Haemophilus influenzae NOT DETECTED NOT DETECTED   Neisseria meningitidis NOT DETECTED NOT DETECTED   Pseudomonas aeruginosa NOT DETECTED NOT DETECTED   Stenotrophomonas maltophilia NOT DETECTED NOT DETECTED   Candida albicans NOT DETECTED NOT DETECTED   Candida auris NOT DETECTED NOT DETECTED   Candida glabrata NOT DETECTED NOT DETECTED   Candida krusei NOT DETECTED NOT DETECTED   Candida parapsilosis NOT DETECTED NOT DETECTED   Candida tropicalis NOT DETECTED NOT DETECTED   Cryptococcus neoformans/gattii NOT DETECTED NOT DETECTED   CTX-M ESBL NOT DETECTED NOT DETECTED   Carbapenem resistance IMP NOT DETECTED NOT DETECTED   Carbapenem resistance KPC NOT DETECTED NOT DETECTED   Carbapenem resistance NDM NOT DETECTED NOT DETECTED   Carbapenem resist OXA 48 LIKE NOT DETECTED NOT DETECTED   Carbapenem resistance VIM NOT DETECTED NOT DETECTED    Alexio Sroka J 11/25/2020  11:20 AM

## 2020-11-25 NOTE — Progress Notes (Addendum)
PROGRESS NOTE    [name]  YQM:[id number] DOB: [birth year] DOA: 11/24/2020 PCP: Vernie Shanks, MD   Brief Narrative: 87 year old male lives at home with his wife he has history of Parkinson's disease and prostate cancer history of prostatectomy CKD stage III a baseline creatinine is around 1.4 status post fall wife called EMS and was brought him to the hospital. Per wife at baseline they both walk 7 blocks daily.  She was concerned as patient was becoming more and more weak generalized weakness altered mental status increasing somnolence no fever chills cough nausea vomiting diarrhea.  She also noticed his oral intake was poor.  Assessment & Plan:   Principal Problem:   Severe sepsis ([place]) Active Problems:   DM2 (diabetes mellitus, type 2) (HCC)   HLD (hyperlipidemia)   Anemia   Parkinson disease ([place])   Acute cystitis   Acute metabolic encephalopathy   Lactic acidosis   Acute kidney injury superimposed on CKD (HCC)   Generalized weakness   Acute hyponatremia  #Sepsis present on admission secondary to gram-negative rod bacteremia.  4 out of 4 bottles with GNR and 1 bottle with staph epidermidis  Continue cefepime.  patient is admitted with fever tachycardia and leukocytosis, lactic acidosis and acute encephalopathy. Lactic acid 1.4 from 2.4 with IV fluids and IV antibiotics. Chest x-ray shows no acute process.  # acute metabolic encephalopathy secondary to sepsis/GNR bacteremia -COVID-negative flu negative. CT head no acute intracranial process.   # AKI on CKD stage IIIb due to dehydration decreased p.o. intake his admission creatinine is 2.13 his baseline creatinine is around 1.4. Creatinine down to 1.8 from 2.13 on admit.  # mild hyponatremia likely due to decreased p.o. intake, continue slow IV fluids with normal saline.  Sodium 134.  # anemia likely multifactorial and chronic related to CKD.  Hemoglobin stable at 9.1.  Upon admission his hemoglobin was 7.9 came up to  9.1 today without any transfusion.  # type 2 diabetes diet controlled check A1c.  # Parkinson's disease on levodopa and carbidopa restart once speech eval is complete  # hyperlipidemia on simvastatin  # thrombocytopenia his platelets were 124 on admission it is down to 104 today.  Likely from sepsis.   ADDENDUM-patient had an episode of rapid response.  He became very wheezy with tachypnea tachycardia and a brief period of A. fib RVR and rigors with a temp of 100.8.  His blood pressure was elevated.  He received Lasix 20 mg IV x1, Cardizem 10 mg IV x1, IV fluids were stopped stat labs and chest x-rays ordered.  Chest x-ray with mild left pleural effusion.  He remains in A. fib, no prior history of A. fib.  Last TSH in the system was normal we will add TSH to the labs check troponin and BNP.  Check echocardiogram. Transient A. fib RVR secondary to sepsis hopefully this will resolve completely.  I have not started him on anticoagulation at this time due to advanced age and thrombocytopenia.  Estimated body mass index is 23.4 kg/m as calculated from the following:   Height as of this encounter: 5\' 6"  (1.676 m).   Weight as of this encounter: 65.8 kg.  DVT prophylaxis: scd due to thrombocytopenia  Code Status: Full code Family Communication: Discussed with wife in the room. Disposition Plan:  Status is: Inpatient  Remains inpatient appropriate because:Altered mental status  Dispo: The patient is from: Home              Anticipated  d/c is to: Home              Patient currently is not medically stable to d/c.   Difficult to place patient No   Consultants:  none  Procedures: None Antimicrobials: Cefepime  Subjective: Patient is resting in bed no family at bedside When called his name he opened his eyes looks at me but is not able to follow any commands but involuntarily moves all extremities.  Objective: Vitals:   11/25/20 0930 11/25/20 1000 11/25/20 1030 11/25/20 1229  BP: (!)  126/47 (!) 145/63 (!) 143/65 110/83  Pulse: 64 72 70 100  Resp: 17 20 18 16   Temp:    98.5 F (36.9 C)  TempSrc:    Oral  SpO2: 97% 96% 99% (!) 88%  Weight:      Height:        Intake/Output Summary (Last 24 hours) at 11/25/2020 1347 Last data filed at 11/25/2020 0720 Gross per 24 hour  Intake 2087.5 ml  Output --  Net 2087.5 ml   Filed Weights   11/24/20 1714  Weight: 65.8 kg    Examination:  General exam: Appears calm and comfortable  Respiratory system: Clear to auscultation. Respiratory effort normal. Cardiovascular system: S1 & S2 heard, RRR. No JVD, murmurs, rubs, gallops or clicks. No pedal edema. Gastrointestinal system: Abdomen is nondistended, soft and nontender. No organomegaly or masses felt. Normal bowel sounds heard. Central nervous system: Alert and oriented. No focal neurological deficits. Extremities: Symmetric 5 x 5 power. Skin: No rashes, lesions or ulcers Psychiatry: Judgement and insight appear normal. Mood & affect appropriate.     Data Reviewed: I have personally reviewed following labs and imaging studies  CBC: Recent Labs  Lab 11/24/20 1704 11/25/20 0230 11/25/20 0454 11/25/20 1138  WBC 11.4*  --  10.1  --   NEUTROABS 10.3*  --  9.1*  --   HGB 8.6* 7.9* 8.1* 9.1*  HCT 25.5* 23.9* 24.8* 27.4*  MCV 99.2  --  99.6  --   PLT 124*  --  104*  --    Basic Metabolic Panel: Recent Labs  Lab 11/24/20 1704 11/25/20 0454  NA 133* 134*  K 4.5 4.0  CL 102 102  CO2 25 24  GLUCOSE 111* 97  BUN 37* 39*  CREATININE 2.13* 1.80*  CALCIUM 8.0* 7.7*  MG 2.1 2.0   GFR: Estimated Creatinine Clearance: 20.2 mL/min (A) (by C-G formula based on SCr of 1.8 mg/dL (H)). Liver Function Tests: Recent Labs  Lab 11/24/20 1704 11/25/20 0454  AST 36 40  ALT 5 8  ALKPHOS 57 48  BILITOT 1.1 0.9  PROT 6.3* 5.7*  ALBUMIN 3.2* 2.6*   No results for input(s): LIPASE, AMYLASE in the last 168 hours. Recent Labs  Lab 11/25/20 0455  AMMONIA 11    Coagulation Profile: Recent Labs  Lab 11/24/20 1704 11/25/20 0454  INR 1.1 1.4*   Cardiac Enzymes: No results for input(s): CKTOTAL, CKMB, CKMBINDEX, TROPONINI in the last 168 hours. BNP (last 3 results) No results for input(s): PROBNP in the last 8760 hours. HbA1C: No results for input(s): HGBA1C in the last 72 hours. CBG: Recent Labs  Lab 11/25/20 0624 11/25/20 1059 11/25/20 1254  GLUCAP 90 83 64*   Lipid Profile: No results for input(s): CHOL, HDL, LDLCALC, TRIG, CHOLHDL, LDLDIRECT in the last 72 hours. Thyroid Function Tests: No results for input(s): TSH, T4TOTAL, FREET4, T3FREE, THYROIDAB in the last 72 hours. Anemia Panel: Recent Labs  11/25/20 0230  RETICCTPCT 2.0   Sepsis Labs: Recent Labs  Lab 11/24/20 1704 11/24/20 1904  LATICACIDVEN 2.4* 1.4    Recent Results (from the past 240 hour(s))  Culture, blood (Routine x 2)     Status: None (Preliminary result)   Collection Time: 11/24/20  5:04 PM   Specimen: BLOOD RIGHT ARM  Result Value Ref Range Status   Specimen Description   Final    BLOOD RIGHT ARM Performed at [facility], [place] [address], [place], [place] [address]    Special Requests   Final    BOTTLES DRAWN AEROBIC AND ANAEROBIC Blood Culture adequate volume Performed at [facility] [address], [place], [place] [address]    Culture  Setup Time   Final    GRAM NEGATIVE RODS IN BOTH AEROBIC AND ANAEROBIC BOTTLES Organism ID to follow CRITICAL RESULT CALLED TO, READ BACK BY AND VERIFIED WITH: PHARMD NATHAN BATCHELDER AT 0240 ON 11/25/20 [place] KJ Performed at [facility], [place] [address] [place], [place] [address]    Culture PENDING  Incomplete   Report Status PENDING  Incomplete  Blood Culture ID Panel (Reflexed)     Status: Abnormal   Collection Time: 11/24/20  5:04 PM  Result Value Ref Range Status   Enterococcus faecalis NOT DETECTED NOT DETECTED Final   Enterococcus Faecium NOT DETECTED NOT  DETECTED Final   Listeria monocytogenes NOT DETECTED NOT DETECTED Final   Staphylococcus species NOT DETECTED NOT DETECTED Final   Staphylococcus aureus (BCID) NOT DETECTED NOT DETECTED Final   Staphylococcus epidermidis NOT DETECTED NOT DETECTED Final   Staphylococcus lugdunensis NOT DETECTED NOT DETECTED Final   Streptococcus species NOT DETECTED NOT DETECTED Final   Streptococcus agalactiae NOT DETECTED NOT DETECTED Final   Streptococcus pneumoniae NOT DETECTED NOT DETECTED Final   Streptococcus pyogenes NOT DETECTED NOT DETECTED Final   A.calcoaceticus-baumannii NOT DETECTED NOT DETECTED Final   Bacteroides fragilis NOT DETECTED NOT DETECTED Final   Enterobacterales DETECTED (A) NOT DETECTED Final    Comment: Enterobacterales represent a large order of gram negative bacteria, not a single organism. CRITICAL RESULT CALLED TO, READ BACK BY AND VERIFIED WITH: [place] AT 2992 ON 11/25/20 BY KJ    Enterobacter cloacae complex DETECTED (A) NOT DETECTED Final    Comment: CRITICAL RESULT CALLED TO, READ BACK BY AND VERIFIED WITH: [place] AT 1117 ON 11/25/20 BY KJ    Escherichia coli NOT DETECTED NOT DETECTED Final   Klebsiella aerogenes NOT DETECTED NOT DETECTED Final   Klebsiella oxytoca NOT DETECTED NOT DETECTED Final   Klebsiella pneumoniae NOT DETECTED NOT DETECTED Final   Proteus species NOT DETECTED NOT DETECTED Final   Salmonella species NOT DETECTED NOT DETECTED Final   Serratia marcescens NOT DETECTED NOT DETECTED Final   Haemophilus influenzae NOT DETECTED NOT DETECTED Final   Neisseria meningitidis NOT DETECTED NOT DETECTED Final   Pseudomonas aeruginosa NOT DETECTED NOT DETECTED Final   Stenotrophomonas maltophilia NOT DETECTED NOT DETECTED Final   Candida albicans NOT DETECTED NOT DETECTED Final   Candida auris NOT DETECTED NOT DETECTED Final   Candida glabrata NOT DETECTED NOT DETECTED Final   Candida krusei NOT DETECTED NOT DETECTED Final    Candida parapsilosis NOT DETECTED NOT DETECTED Final   Candida tropicalis NOT DETECTED NOT DETECTED Final   Cryptococcus neoformans/gattii NOT DETECTED NOT DETECTED Final   CTX-M ESBL NOT DETECTED NOT DETECTED Final   Carbapenem resistance IMP NOT DETECTED NOT DETECTED  Final   Carbapenem resistance KPC NOT DETECTED NOT DETECTED Final   Carbapenem resistance NDM NOT DETECTED NOT DETECTED Final   Carbapenem resist OXA 48 LIKE NOT DETECTED NOT DETECTED Final   Carbapenem resistance VIM NOT DETECTED NOT DETECTED Final    Comment: Performed at [facility], [place] [address], [place], [place] [address]  Culture, blood (Routine x 2)     Status: None (Preliminary result)   Collection Time: 11/24/20  5:09 PM   Specimen: BLOOD LEFT ARM  Result Value Ref Range Status   Specimen Description   Final    BLOOD LEFT ARM Performed at [facility], [place] [address], [place], [place] [address]    Special Requests   Final    BOTTLES DRAWN AEROBIC AND ANAEROBIC Blood Culture adequate volume Performed at [facility] [address] [place], [place] [address]    Culture  Setup Time   Final    GRAM POSITIVE COCCI GRAM NEGATIVE RODS Organism ID to follow IN BOTH AEROBIC AND ANAEROBIC BOTTLES CRITICAL RESULT CALLED TO, READ BACK BY AND VERIFIED WITH: PHARMD A.PHAN AT 1239 ON 11/25/2020 BY T.SAAD. Performed at [facility], [place] [address] [place],  [address]    Culture Lonell Grandchild NEGATIVE RODS GRAM POSITIVE COCCI   Final   Report Status PENDING  Incomplete  Blood Culture ID Panel (Reflexed)     Status: Abnormal   Collection Time: 11/24/20  5:09 PM  Result Value Ref Range Status   Enterococcus faecalis NOT DETECTED NOT DETECTED Final   Enterococcus Faecium NOT DETECTED NOT DETECTED Final   Listeria monocytogenes NOT DETECTED NOT DETECTED Final   Staphylococcus species DETECTED (A) NOT DETECTED Final    Comment: CRITICAL RESULT CALLED TO, READ BACK BY AND VERIFIED  WITH: PHARMD A.PHAN AT 1239 ON 11/25/2020 BY T.SAAD.    Staphylococcus aureus (BCID) NOT DETECTED NOT DETECTED Final   Staphylococcus epidermidis DETECTED (A) NOT DETECTED Final    Comment: Methicillin (oxacillin) resistant coagulase negative staphylococcus. Possible blood culture contaminant (unless isolated from more than one blood culture draw or clinical case suggests pathogenicity). No antibiotic treatment is indicated for blood  culture contaminants. CRITICAL RESULT CALLED TO, READ BACK BY AND VERIFIED WITH: PHARMD A.PHAN AT 1239 ON 11/25/2020 BY T.SAAD.    Staphylococcus lugdunensis NOT DETECTED NOT DETECTED Final   Streptococcus species NOT DETECTED NOT DETECTED Final   Streptococcus agalactiae NOT DETECTED NOT DETECTED Final   Streptococcus pneumoniae NOT DETECTED NOT DETECTED Final   Streptococcus pyogenes NOT DETECTED NOT DETECTED Final   A.calcoaceticus-baumannii NOT DETECTED NOT DETECTED Final   Bacteroides fragilis NOT DETECTED NOT DETECTED Final   Enterobacterales DETECTED (A) NOT DETECTED Final    Comment: Enterobacterales represent a large order of gram negative bacteria, not a single organism. CRITICAL RESULT CALLED TO, READ BACK BY AND VERIFIED WITH: PHARMD A.PHAN AT 1239 ON 11/25/2020 BY T.SAAD.    Enterobacter cloacae complex DETECTED (A) NOT DETECTED Final    Comment: CRITICAL RESULT CALLED TO, READ BACK BY AND VERIFIED WITH: PHARMD A.PHAN AT 1239 ON 11/25/2020 BY T.SAAD.    Escherichia coli NOT DETECTED NOT DETECTED Final   Klebsiella aerogenes NOT DETECTED NOT DETECTED Final   Klebsiella oxytoca NOT DETECTED NOT DETECTED Final   Klebsiella pneumoniae NOT DETECTED NOT DETECTED Final   Proteus species NOT DETECTED NOT DETECTED Final   Salmonella species NOT DETECTED NOT DETECTED Final   Serratia marcescens NOT DETECTED NOT DETECTED Final   Haemophilus  influenzae NOT DETECTED NOT DETECTED Final   Neisseria meningitidis NOT DETECTED NOT DETECTED Final    Pseudomonas aeruginosa NOT DETECTED NOT DETECTED Final   Stenotrophomonas maltophilia NOT DETECTED NOT DETECTED Final   Candida albicans NOT DETECTED NOT DETECTED Final   Candida auris NOT DETECTED NOT DETECTED Final   Candida glabrata NOT DETECTED NOT DETECTED Final   Candida krusei NOT DETECTED NOT DETECTED Final   Candida parapsilosis NOT DETECTED NOT DETECTED Final   Candida tropicalis NOT DETECTED NOT DETECTED Final   Cryptococcus neoformans/gattii NOT DETECTED NOT DETECTED Final   CTX-M ESBL NOT DETECTED NOT DETECTED Final   Carbapenem resistance IMP NOT DETECTED NOT DETECTED Final   Carbapenem resistance KPC NOT DETECTED NOT DETECTED Final   Methicillin resistance mecA/C DETECTED (A) NOT DETECTED Final    Comment: CRITICAL RESULT CALLED TO, READ BACK BY AND VERIFIED WITH: PHARMD A.PHAN AT 1239 ON 11/25/2020 BY T.SAAD.    Carbapenem resistance NDM NOT DETECTED NOT DETECTED Final   Carbapenem resist OXA 48 LIKE NOT DETECTED NOT DETECTED Final   Carbapenem resistance VIM NOT DETECTED NOT DETECTED Final    Comment: Performed at [facility], [place] [address] [place], [place] [address]  Resp Panel by RT-PCR (Flu A&B, Covid) Nasopharyngeal Swab     Status: None   Collection Time: 11/24/20  5:12 PM   Specimen: Nasopharyngeal Swab; Nasopharyngeal(NP) swabs in vial transport medium  Result Value Ref Range Status   SARS Coronavirus 2 by RT PCR NEGATIVE NEGATIVE Final    Comment: (NOTE) SARS-CoV-2 target nucleic acids are NOT DETECTED.  The SARS-CoV-2 RNA is generally detectable in upper respiratory specimens during the acute phase of infection. The lowest concentration of SARS-CoV-2 viral copies this assay can detect is 138 copies/mL. A negative result does not preclude SARS-Cov-2 infection and should not be used as the sole basis for treatment or other patient management decisions. A negative result may occur with  improper specimen collection/handling, submission of specimen  other than nasopharyngeal swab, presence of viral mutation(s) within the areas targeted by this assay, and inadequate number of viral copies(<138 copies/mL). A negative result must be combined with clinical observations, patient history, and epidemiological information. The expected result is Negative.  Fact Sheet for Patients:  EntrepreneurPulse.com.au  Fact Sheet for Healthcare Providers:  IncredibleEmployment.be  This test is no t yet approved or cleared by the [place] FDA and  has been authorized for detection and/or diagnosis of SARS-CoV-2 by FDA under an Emergency Use Authorization (EUA). This EUA will remain  in effect (meaning this test can be used) for the duration of the COVID-19 declaration under Section 564(b)(1) of the Act, 21 U.S.C.section 360bbb-3(b)(1), unless the authorization is terminated  or revoked sooner.       Influenza A by PCR NEGATIVE NEGATIVE Final   Influenza B by PCR NEGATIVE NEGATIVE Final    Comment: (NOTE) The Xpert Xpress SARS-CoV-2/FLU/RSV plus assay is intended as an aid in the diagnosis of influenza from Nasopharyngeal swab specimens and should not be used as a sole basis for treatment. Nasal washings and aspirates are unacceptable for Xpert Xpress SARS-CoV-2/FLU/RSV testing.  Fact Sheet for Patients: EntrepreneurPulse.com.au  Fact Sheet for Healthcare Providers: IncredibleEmployment.be  This test is not yet approved or cleared by the [place] FDA and has been authorized for detection and/or diagnosis of SARS-CoV-2 by FDA under an Emergency Use Authorization (EUA). This EUA will remain in effect (meaning this test can be used) for the duration of the  COVID-19 declaration under Section 564(b)(1) of the Act, 21 U.S.C. section 360bbb-3(b)(1), unless the authorization is terminated or revoked.  Performed at [facility], [place] [address], [place], [place] [address]          Radiology Studies: CT Head Wo Contrast  Result Date: 11/24/2020 CLINICAL DATA:  Unwitnessed fall EXAM: CT HEAD WITHOUT CONTRAST CT CERVICAL SPINE WITHOUT CONTRAST TECHNIQUE: Multidetector CT imaging of the head and cervical spine was performed following the standard protocol without intravenous contrast. Multiplanar CT image reconstructions of the cervical spine were also generated. COMPARISON:  11/02/2006 FINDINGS: CT HEAD FINDINGS Brain: No evidence of acute infarction, hemorrhage, hydrocephalus, extra-axial collection or mass lesion/mass effect. Mild low-density changes within the periventricular and subcortical white matter compatible with chronic microvascular ischemic change. Mild diffuse cerebral volume loss. Vascular: Atherosclerotic calcifications involving the large vessels of the skull base. No unexpected hyperdense vessel. Skull: Normal. Negative for fracture or focal lesion. Sinuses/Orbits: Partial opacification of the left frontal sinus. Remaining paranasal sinuses and mastoid air cells are clear. Orbital structures within normal limits. Other: Negative for scalp hematoma. CT CERVICAL SPINE FINDINGS Alignment: Facet joints are aligned without dislocation or traumatic listhesis. Dens and lateral masses are aligned. Straightening of the cervical lordosis. Skull base and vertebrae: No acute fracture. No primary bone lesion or focal pathologic process. The right C2-3 facet joint is fused. Soft tissues and spinal canal: No prevertebral fluid or swelling. No visible canal hematoma. Disc levels: Multilevel degenerative disc disease, most severe at C5-6 where there is endplate osteophytosis and posterior longitudinal ligament ossification resulting in at least moderate canal stenosis. Multilevel facet and uncovertebral arthropathy is also present throughout the cervical spine. Upper chest: Negative. Other: Bilateral carotid atherosclerosis. IMPRESSION: 1. No  evidence of acute intracranial process. 2. Mild chronic microvascular ischemic change and cerebral volume loss. 3. No evidence of acute fracture or traumatic listhesis of the cervical spine. 4. Multilevel degenerative disc disease and facet arthropathy of the cervical spine, most severe at C5-6 where there is at least moderate canal stenosis. Electronically Signed   By: Davina Poke D.O.   On: 11/24/2020 18:09   CT Cervical Spine Wo Contrast  Result Date: 11/24/2020 CLINICAL DATA:  Unwitnessed fall EXAM: CT HEAD WITHOUT CONTRAST CT CERVICAL SPINE WITHOUT CONTRAST TECHNIQUE: Multidetector CT imaging of the head and cervical spine was performed following the standard protocol without intravenous contrast. Multiplanar CT image reconstructions of the cervical spine were also generated. COMPARISON:  11/02/2006 FINDINGS: CT HEAD FINDINGS Brain: No evidence of acute infarction, hemorrhage, hydrocephalus, extra-axial collection or mass lesion/mass effect. Mild low-density changes within the periventricular and subcortical white matter compatible with chronic microvascular ischemic change. Mild diffuse cerebral volume loss. Vascular: Atherosclerotic calcifications involving the large vessels of the skull base. No unexpected hyperdense vessel. Skull: Normal. Negative for fracture or focal lesion. Sinuses/Orbits: Partial opacification of the left frontal sinus. Remaining paranasal sinuses and mastoid air cells are clear. Orbital structures within normal limits. Other: Negative for scalp hematoma. CT CERVICAL SPINE FINDINGS Alignment: Facet joints are aligned without dislocation or traumatic listhesis. Dens and lateral masses are aligned. Straightening of the cervical lordosis. Skull base and vertebrae: No acute fracture. No primary bone lesion or focal pathologic process. The right C2-3 facet joint is fused. Soft tissues and spinal canal: No prevertebral fluid or swelling. No visible canal hematoma. Disc levels:  Multilevel degenerative disc disease, most severe at C5-6 where there is endplate osteophytosis and posterior longitudinal ligament ossification resulting in at  least moderate canal stenosis. Multilevel facet and uncovertebral arthropathy is also present throughout the cervical spine. Upper chest: Negative. Other: Bilateral carotid atherosclerosis. IMPRESSION: 1. No evidence of acute intracranial process. 2. Mild chronic microvascular ischemic change and cerebral volume loss. 3. No evidence of acute fracture or traumatic listhesis of the cervical spine. 4. Multilevel degenerative disc disease and facet arthropathy of the cervical spine, most severe at C5-6 where there is at least moderate canal stenosis. Electronically Signed   By: Davina Poke D.O.   On: 11/24/2020 18:09   DG Chest Port 1 View  Result Date: 11/24/2020 CLINICAL DATA:  Unwitnessed fall. Fever. History of prostate cancer. EXAM: PORTABLE CHEST 1 VIEW COMPARISON:  07/05/2017 FINDINGS: Shallow inspiration. Linear atelectasis in the lung bases. Heart size and pulmonary vascularity are normal for technique. No pleural effusions. No pneumothorax. Calcified and tortuous aorta. Degenerative changes in the spine and shoulders. IMPRESSION: Shallow inspiration with linear atelectasis in the lung bases. Electronically Signed   By: Lucienne Capers M.D.   On: 11/24/2020 17:56        Scheduled Meds:  acetaminophen  650 mg Oral Once   insulin aspart  0-6 Units Subcutaneous Q6H   Continuous Infusions:  ceFEPime (MAXIPIME) IV       LOS: 1 day    Time spent: 37 min    Georgette Shell, MD [facility] 11/25/2020, 1:47 PM

## 2020-11-25 NOTE — ED Notes (Signed)
The patient's wife is coming out to the nursing desk, asking about feeding and giving po fluids.  Was made aware that the patient is currently NPO

## 2020-11-25 NOTE — ED Notes (Signed)
The patient's wife has been notified of the bed assignment

## 2020-11-25 NOTE — Progress Notes (Signed)
RN spoke with patient's son, Aaran Enberg. Son voiced that we would like to be involved in all decisions regarding patient's care.Son would like to be present for palliative care consult but lives in [place], [place] and would not be able to make it to facility until tomorrow. He would appreciate a phone call from palliative care team if he is not present. He can be reached at [phone].

## 2020-11-25 NOTE — ED Notes (Signed)
The patient's wife is at the bedside.  Continues to wait for admission bed assignment.  No changes noted when checked.

## 2020-11-25 NOTE — Progress Notes (Signed)
   11/25/20 2130  Assess: MEWS Score  Level of Consciousness Alert  Assess: MEWS Score  MEWS Temp 0  MEWS Systolic 0  MEWS Pulse 0  MEWS RR 1  MEWS LOC 0  MEWS Score 1  MEWS Score Color Green  Treat  MEWS Interventions Escalated (See documentation below)  Pain Scale Faces  Pain Score Asleep  Faces Pain Scale 0  Breathing 1  Negative Vocalization 0  Facial Expression 0  Body Language 1  Consolability 0  PAINAD Score 2  Complains of  (patient resting)  Take Vital Signs  Increase Vital Sign Frequency  Yellow: Q 2hr X 2 then Q 4hr X 2, if remains yellow, continue Q 4hrs  Escalate  MEWS: Escalate Yellow: discuss with charge nurse/RN and consider discussing with provider and RRT  Notify: Charge Nurse/RN  Name of Charge Nurse/RN Notified Carmin Richmond, RN  Date Charge Nurse/RN Notified 11/25/20  Time Charge Nurse/RN Notified 2134  Notify: Provider  Provider Name/Title Ardith Dark. NP  Date Provider Notified 11/25/20  Time Provider Notified 2134  Notification Type  (secure chat)  Notification Reason Change in status (change to a-fib pattern and yellow mews)  Document  Progress note created (see row info) Yes  Charge nurse and Nurse Practitioner on call notified of changes to yellow mews and change to a-fib. Will continue to monitor and wait on orders or response.

## 2020-11-25 NOTE — ED Notes (Signed)
ED TO INPATIENT HANDOFF REPORT  Name/Age/Gender [name] 87 y.o. male  Code Status    Code Status Orders  (From admission, onward)         Start     Ordered   11/24/20 2130  Full code  Continuous        11/24/20 2130        Code Status History    This patient has a current code status but no historical code status.    Advance Directive Documentation   Flowsheet Row Most Recent Value  Type of Advance Directive Healthcare Power of Attorney, Living will  Pre-existing out of facility DNR order (yellow form or pink MOST form) --  "MOST" Form in Place? --      Home/SNF/Other Home  Chief Complaint Severe sepsis ([place]) [A41.9, R65.20]  Level of Care/Admitting Diagnosis ED Disposition    ED Disposition  Admit   Condition  --   Comment  Hospital Area: [facility] [[id number]]  Level of Care: Telemetry [5]  Admit to tele based on following criteria: Monitor for Ischemic changes  May admit patient to [facility] or [facility] if equivalent level of care is available:: No  Covid Evaluation: Confirmed COVID Negative  Diagnosis: Severe sepsis [facility]) [[id number]]  Admitting Physician: Rhetta Mura [[id number]]  Attending Physician: Rhetta Mura [[id number]]  Estimated length of stay: past midnight tomorrow  Certification:: I certify this patient will need inpatient services for at least 2 midnights         Medical History Past Medical History:  Diagnosis Date  . Allergy    rhinitis  . Diabetes mellitus    dx 3-11, A1C 6.7  . Gout   . Hiatal hernia   . Hyperlipidemia   . Parkinson disease ([place]) 10/23/2015  . Prostate CA [facility]) 2002   s/p  x-ray therapy and surgery  . Skin cancer   . Trigeminal neuralgia    h/o , status post gamma knife procedure and trial with multiple drugs    Allergies No Known Allergies  IV Location/Drains/Wounds Patient Lines/Drains/Airways Status    Active Line/Drains/Airways    Name Placement date Placement  time Site Days   Peripheral IV 11/24/20 20 G Left Antecubital 11/24/20  1653  Antecubital  1   Peripheral IV 11/24/20 20 G Right Antecubital 11/24/20  --  Antecubital  1   External Urinary Catheter 11/24/20  1857  --  1          Labs/Imaging Results for orders placed or performed during the hospital encounter of 11/24/20 (from the past 48 hour(s))  Comprehensive metabolic panel     Status: Abnormal   Collection Time: 11/24/20  5:04 PM  Result Value Ref Range   Sodium 133 (L) 135 - 145 mmol/L   Potassium 4.5 3.5 - 5.1 mmol/L   Chloride 102 98 - 111 mmol/L   CO2 25 22 - 32 mmol/L   Glucose, Bld 111 (H) 70 - 99 mg/dL    Comment: Glucose reference range applies only to samples taken after fasting for at least 8 hours.   BUN 37 (H) 8 - 23 mg/dL   Creatinine, Ser 2.13 (H) 0.61 - 1.24 mg/dL   Calcium 8.0 (L) 8.9 - 10.3 mg/dL   Total Protein 6.3 (L) 6.5 - 8.1 g/dL   Albumin 3.2 (L) 3.5 - 5.0 g/dL   AST 36 15 - 41 U/L   ALT 5 0 - 44 U/L   Alkaline Phosphatase 57  38 - 126 U/L   Total Bilirubin 1.1 0.3 - 1.2 mg/dL   GFR, Estimated 27 (L) >60 mL/min    Comment: (NOTE) Calculated using the CKD-EPI Creatinine Equation (2021)    Anion gap 6 5 - 15    Comment: Performed at [facility], [place] [address], [place], [place] [address]  Lactic acid, plasma     Status: Abnormal   Collection Time: 11/24/20  5:04 PM  Result Value Ref Range   Lactic Acid, Venous 2.4 (HH) 0.5 - 1.9 mmol/L    Comment: CRITICAL RESULT CALLED TO, READ BACK BY AND VERIFIED WITH: A.WOODY, RN AT 1758 ON 06.13.22 BY N.THOMPSON Performed at [facility], [place] [address], [place], [place] [address]   CBC with Differential     Status: Abnormal   Collection Time: 11/24/20  5:04 PM  Result Value Ref Range   WBC 11.4 (H) 4.0 - 10.5 K/uL   RBC 2.57 (L) 4.22 - 5.81 MIL/uL   Hemoglobin 8.6 (L) 13.0 - 17.0 g/dL   HCT 25.5 (L) 39.0 - 52.0 %   MCV 99.2 80.0 - 100.0 fL   MCH 33.5 26.0 - 34.0  pg   MCHC 33.7 30.0 - 36.0 g/dL   RDW 14.0 11.5 - 15.5 %   Platelets 124 (L) 150 - 400 K/uL    Comment: Immature Platelet Fraction may be clinically indicated, consider ordering this additional test [id number]    nRBC 0.0 0.0 - 0.2 %   Neutrophils Relative % 90 %   Neutro Abs 10.3 (H) 1.7 - 7.7 K/uL   Lymphocytes Relative 3 %   Lymphs Abs 0.3 (L) 0.7 - 4.0 K/uL   Monocytes Relative 6 %   Monocytes Absolute 0.7 0.1 - 1.0 K/uL   Eosinophils Relative 0 %   Eosinophils Absolute 0.0 0.0 - 0.5 K/uL   Basophils Relative 0 %   Basophils Absolute 0.0 0.0 - 0.1 K/uL   Immature Granulocytes 1 %   Abs Immature Granulocytes 0.08 (H) 0.00 - 0.07 K/uL    Comment: Performed at [facility], [place] [address], [place], [place] [address]  Protime-INR     Status: None   Collection Time: 11/24/20  5:04 PM  Result Value Ref Range   Prothrombin Time 14.3 11.4 - 15.2 seconds   INR 1.1 0.8 - 1.2    Comment: (NOTE) INR goal varies based on device and disease states. Performed at [facility], [place] [address] [place], [place] [address]   Culture, blood (Routine x 2)     Status: None (Preliminary result)   Collection Time: 11/24/20  5:04 PM   Specimen: BLOOD RIGHT ARM  Result Value Ref Range   Specimen Description      BLOOD RIGHT ARM Performed at [facility], [place] [address], [place], [place] [address]    Special Requests      BOTTLES DRAWN AEROBIC AND ANAEROBIC Blood Culture adequate volume Performed at [place] [address] [place], [place] [address]    Culture PENDING    Report Status PENDING   Magnesium     Status: None   Collection Time: 11/24/20  5:04 PM  Result Value Ref Range   Magnesium 2.1 1.7 - 2.4 mg/dL    Comment: Performed at [facility], [place] [address] [place], [place] [address]  Culture, blood (Routine x 2)     Status: None (Preliminary result)   Collection Time: 11/24/20  5:09 PM    Specimen: BLOOD LEFT ARM  Result Value Ref Range   Specimen Description      BLOOD LEFT ARM Performed at [facility], [address] [place], [place] [address]    Special Requests      BOTTLES DRAWN AEROBIC AND ANAEROBIC Blood Culture adequate volume Performed at [place] [address], [place], [place] [address]    Culture PENDING    Report Status PENDING   Resp Panel by RT-PCR (Flu A&B, Covid) Nasopharyngeal Swab     Status: None   Collection Time: 11/24/20  5:12 PM   Specimen: Nasopharyngeal Swab; Nasopharyngeal(NP) swabs in vial transport medium  Result Value Ref Range   SARS Coronavirus 2 by RT PCR NEGATIVE NEGATIVE    Comment: (NOTE) SARS-CoV-2 target nucleic acids are NOT DETECTED.  The SARS-CoV-2 RNA is generally detectable in upper respiratory specimens during the acute phase of infection. The lowest concentration of SARS-CoV-2 viral copies this assay can detect is 138 copies/mL. A negative result does not preclude SARS-Cov-2 infection and should not be used as the sole basis for treatment or other patient management decisions. A negative result may occur with  improper specimen collection/handling, submission of specimen other than nasopharyngeal swab, presence of viral mutation(s) within the areas targeted by this assay, and inadequate number of viral copies(<138 copies/mL). A negative result must be combined with clinical observations, patient history, and epidemiological information. The expected result is Negative.  Fact Sheet for Patients:  EntrepreneurPulse.com.au  Fact Sheet for Healthcare Providers:  IncredibleEmployment.be  This test is no t yet approved or cleared by the [place] FDA and  has been authorized for detection and/or diagnosis of SARS-CoV-2 by FDA under an Emergency Use Authorization (EUA). This EUA will remain  in effect (meaning this test can be used) for the duration  of the COVID-19 declaration under Section 564(b)(1) of the Act, 21 U.S.C.section 360bbb-3(b)(1), unless the authorization is terminated  or revoked sooner.       Influenza A by PCR NEGATIVE NEGATIVE   Influenza B by PCR NEGATIVE NEGATIVE    Comment: (NOTE) The Xpert Xpress SARS-CoV-2/FLU/RSV plus assay is intended as an aid in the diagnosis of influenza from Nasopharyngeal swab specimens and should not be used as a sole basis for treatment. Nasal washings and aspirates are unacceptable for Xpert Xpress SARS-CoV-2/FLU/RSV testing.  Fact Sheet for Patients: EntrepreneurPulse.com.au  Fact Sheet for Healthcare Providers: IncredibleEmployment.be  This test is not yet approved or cleared by the [place] FDA and has been authorized for detection and/or diagnosis of SARS-CoV-2 by FDA under an Emergency Use Authorization (EUA). This EUA will remain in effect (meaning this test can be used) for the duration of the COVID-19 declaration under Section 564(b)(1) of the Act, 21 U.S.C. section 360bbb-3(b)(1), unless the authorization is terminated or revoked.  Performed at [facility], [place] [address] [place], [place] [address]   Lactic acid, plasma     Status: None   Collection Time: 11/24/20  7:04 PM  Result Value Ref Range   Lactic Acid, Venous 1.4 0.5 - 1.9 mmol/L    Comment: Performed at [facility], [place] [address], [place], [place] [address]  Urinalysis, Routine w reflex microscopic Urine, Catheterized     Status: Abnormal   Collection Time: 11/24/20  8:30 PM  Result Value Ref Range   Color, Urine RED (A) YELLOW    Comment: BIOCHEMICALS MAY BE AFFECTED BY COLOR  APPearance HAZY (A) CLEAR   Specific Gravity, Urine 1.011 1.005 - 1.030   pH 6.0 5.0 - 8.0   Glucose, UA NEGATIVE NEGATIVE mg/dL   Hgb urine dipstick LARGE (A) NEGATIVE   Bilirubin Urine NEGATIVE NEGATIVE   Ketones, ur 5 (A) NEGATIVE mg/dL    Protein, ur 100 (A) NEGATIVE mg/dL   Nitrite POSITIVE (A) NEGATIVE   Leukocytes,Ua MODERATE (A) NEGATIVE   RBC / HPF >50 (H) 0 - 5 RBC/hpf   WBC, UA >50 (H) 0 - 5 WBC/hpf   Bacteria, UA MANY (A) NONE SEEN   WBC Clumps PRESENT    Crystals PRESENT (A) NEGATIVE    Comment: Performed at [facility], [place] [address] [place], [place] [address]  Hemoglobin and hematocrit, blood     Status: Abnormal   Collection Time: 11/25/20  2:30 AM  Result Value Ref Range   Hemoglobin 7.9 (L) 13.0 - 17.0 g/dL   HCT 23.9 (L) 39.0 - 52.0 %    Comment: Performed at [facility], [place] [address] [place], [place] [address]  Reticulocytes     Status: Abnormal   Collection Time: 11/25/20  2:30 AM  Result Value Ref Range   Retic Ct Pct 2.0 0.4 - 3.1 %   RBC. 2.40 (L) 4.22 - 5.81 MIL/uL   Retic Count, Absolute 47.3 19.0 - 186.0 K/uL   Immature Retic Fract 20.5 (H) 2.3 - 15.9 %    Comment: Performed at [facility], [place] [address] [place], [place] [address]  Magnesium     Status: None   Collection Time: 11/25/20  4:54 AM  Result Value Ref Range   Magnesium 2.0 1.7 - 2.4 mg/dL    Comment: Performed at [facility], [place] [address], [place], [place] [address]  Comprehensive metabolic panel     Status: Abnormal   Collection Time: 11/25/20  4:54 AM  Result Value Ref Range   Sodium 134 (L) 135 - 145 mmol/L   Potassium 4.0 3.5 - 5.1 mmol/L   Chloride 102 98 - 111 mmol/L   CO2 24 22 - 32 mmol/L   Glucose, Bld 97 70 - 99 mg/dL    Comment: Glucose reference range applies only to samples taken after fasting for at least 8 hours.   BUN 39 (H) 8 - 23 mg/dL   Creatinine, Ser 1.80 (H) 0.61 - 1.24 mg/dL   Calcium 7.7 (L) 8.9 - 10.3 mg/dL   Total Protein 5.7 (L) 6.5 - 8.1 g/dL   Albumin 2.6 (L) 3.5 - 5.0 g/dL   AST 40 15 - 41 U/L   ALT 8 0 - 44 U/L   Alkaline Phosphatase 48 38 - 126 U/L   Total Bilirubin 0.9 0.3 - 1.2 mg/dL   GFR,  Estimated 33 (L) >60 mL/min    Comment: (NOTE) Calculated using the CKD-EPI Creatinine Equation (2021)    Anion gap 8 5 - 15    Comment: Performed at [facility], [place] [address], [place],  [address]  CBC with Differential/Platelet     Status: Abnormal   Collection Time: 11/25/20  4:54 AM  Result Value Ref Range   WBC 10.1 4.0 - 10.5 K/uL   RBC 2.49 (L) 4.22 - 5.81 MIL/uL   Hemoglobin 8.1 (L) 13.0 - 17.0 g/dL   HCT 24.8 (L) 39.0 - 52.0 %   MCV 99.6 80.0 - 100.0 fL   MCH 32.5 26.0 - 34.0 pg   MCHC  32.7 30.0 - 36.0 g/dL   RDW 14.3 11.5 - 15.5 %   Platelets 104 (L) 150 - 400 K/uL    Comment: Immature Platelet Fraction may be clinically indicated, consider ordering this additional test [id number]    nRBC 0.0 0.0 - 0.2 %   Neutrophils Relative % 90 %   Neutro Abs 9.1 (H) 1.7 - 7.7 K/uL   Lymphocytes Relative 3 %   Lymphs Abs 0.3 (L) 0.7 - 4.0 K/uL   Monocytes Relative 5 %   Monocytes Absolute 0.5 0.1 - 1.0 K/uL   Eosinophils Relative 0 %   Eosinophils Absolute 0.0 0.0 - 0.5 K/uL   Basophils Relative 0 %   Basophils Absolute 0.0 0.0 - 0.1 K/uL   Immature Granulocytes 2 %   Abs Immature Granulocytes 0.24 (H) 0.00 - 0.07 K/uL    Comment: Performed at [facility], [place] [address], [place], [place] [address]  Protime-INR     Status: Abnormal   Collection Time: 11/25/20  4:54 AM  Result Value Ref Range   Prothrombin Time 16.7 (H) 11.4 - 15.2 seconds   INR 1.4 (H) 0.8 - 1.2    Comment: (NOTE) INR goal varies based on device and disease states. Performed at [facility], [place] [address] [place], [place] [address]   APTT     Status: Abnormal   Collection Time: 11/25/20  4:55 AM  Result Value Ref Range   aPTT 37 (H) 24 - 36 seconds    Comment:        IF BASELINE aPTT IS ELEVATED, SUGGEST PATIENT RISK ASSESSMENT BE USED TO DETERMINE APPROPRIATE ANTICOAGULANT THERAPY. Performed at [facility], [place] [address], [place], [place] [address]   Ammonia     Status: None   Collection Time: 11/25/20  4:55 AM  Result Value Ref Range   Ammonia 11 9 - 35 umol/L    Comment: Performed at [facility], [place] [address] [place], [place] [address]  Blood gas, venous     Status: Abnormal   Collection Time: 11/25/20  4:55 AM  Result Value Ref Range   pH, Ven 7.365 7.250 - 7.430   pCO2, Ven 42.8 (L) 44.0 - 60.0 mmHg   pO2, Ven 24.0 (LL) 32.0 - 45.0 mmHg    Comment: CRITICAL RESULT CALLED TO, READ BACK BY AND VERIFIED WITH: Yelina Sarratt, RN @ 0505 ON 11/25/20 C VARNER    Bicarbonate 23.8 20.0 - 28.0 mmol/L   Acid-base deficit 0.9 0.0 - 2.0 mmol/L   O2 Saturation 27.8 %   Patient temperature 98.6     Comment: Performed at [facility], [place] [address], [place], [place] [address]  CBG monitoring, ED     Status: None   Collection Time: 11/25/20  6:24 AM  Result Value Ref Range   Glucose-Capillary 90 70 - 99 mg/dL    Comment: Glucose reference range applies only to samples taken after fasting for at least 8 hours.   CT Head Wo Contrast  Result Date: 11/24/2020 CLINICAL DATA:  Unwitnessed fall EXAM: CT HEAD WITHOUT CONTRAST CT CERVICAL SPINE WITHOUT CONTRAST TECHNIQUE: Multidetector CT imaging of the head and cervical spine was performed following the standard protocol without intravenous contrast. Multiplanar CT image reconstructions of the cervical spine were also generated. COMPARISON:  11/02/2006 FINDINGS: CT HEAD FINDINGS Brain: No evidence of acute infarction, hemorrhage, hydrocephalus, extra-axial collection or mass lesion/mass effect. Mild low-density changes within the periventricular and subcortical white  matter compatible with chronic microvascular ischemic change. Mild diffuse cerebral volume loss. Vascular: Atherosclerotic calcifications involving the large vessels of the skull base. No unexpected hyperdense vessel. Skull: Normal. Negative for fracture or focal  lesion. Sinuses/Orbits: Partial opacification of the left frontal sinus. Remaining paranasal sinuses and mastoid air cells are clear. Orbital structures within normal limits. Other: Negative for scalp hematoma. CT CERVICAL SPINE FINDINGS Alignment: Facet joints are aligned without dislocation or traumatic listhesis. Dens and lateral masses are aligned. Straightening of the cervical lordosis. Skull base and vertebrae: No acute fracture. No primary bone lesion or focal pathologic process. The right C2-3 facet joint is fused. Soft tissues and spinal canal: No prevertebral fluid or swelling. No visible canal hematoma. Disc levels: Multilevel degenerative disc disease, most severe at C5-6 where there is endplate osteophytosis and posterior longitudinal ligament ossification resulting in at least moderate canal stenosis. Multilevel facet and uncovertebral arthropathy is also present throughout the cervical spine. Upper chest: Negative. Other: Bilateral carotid atherosclerosis. IMPRESSION: 1. No evidence of acute intracranial process. 2. Mild chronic microvascular ischemic change and cerebral volume loss. 3. No evidence of acute fracture or traumatic listhesis of the cervical spine. 4. Multilevel degenerative disc disease and facet arthropathy of the cervical spine, most severe at C5-6 where there is at least moderate canal stenosis. Electronically Signed   By: Davina Poke D.O.   On: 11/24/2020 18:09   CT Cervical Spine Wo Contrast  Result Date: 11/24/2020 CLINICAL DATA:  Unwitnessed fall EXAM: CT HEAD WITHOUT CONTRAST CT CERVICAL SPINE WITHOUT CONTRAST TECHNIQUE: Multidetector CT imaging of the head and cervical spine was performed following the standard protocol without intravenous contrast. Multiplanar CT image reconstructions of the cervical spine were also generated. COMPARISON:  11/02/2006 FINDINGS: CT HEAD FINDINGS Brain: No evidence of acute infarction, hemorrhage, hydrocephalus, extra-axial collection or  mass lesion/mass effect. Mild low-density changes within the periventricular and subcortical white matter compatible with chronic microvascular ischemic change. Mild diffuse cerebral volume loss. Vascular: Atherosclerotic calcifications involving the large vessels of the skull base. No unexpected hyperdense vessel. Skull: Normal. Negative for fracture or focal lesion. Sinuses/Orbits: Partial opacification of the left frontal sinus. Remaining paranasal sinuses and mastoid air cells are clear. Orbital structures within normal limits. Other: Negative for scalp hematoma. CT CERVICAL SPINE FINDINGS Alignment: Facet joints are aligned without dislocation or traumatic listhesis. Dens and lateral masses are aligned. Straightening of the cervical lordosis. Skull base and vertebrae: No acute fracture. No primary bone lesion or focal pathologic process. The right C2-3 facet joint is fused. Soft tissues and spinal canal: No prevertebral fluid or swelling. No visible canal hematoma. Disc levels: Multilevel degenerative disc disease, most severe at C5-6 where there is endplate osteophytosis and posterior longitudinal ligament ossification resulting in at least moderate canal stenosis. Multilevel facet and uncovertebral arthropathy is also present throughout the cervical spine. Upper chest: Negative. Other: Bilateral carotid atherosclerosis. IMPRESSION: 1. No evidence of acute intracranial process. 2. Mild chronic microvascular ischemic change and cerebral volume loss. 3. No evidence of acute fracture or traumatic listhesis of the cervical spine. 4. Multilevel degenerative disc disease and facet arthropathy of the cervical spine, most severe at C5-6 where there is at least moderate canal stenosis. Electronically Signed   By: Davina Poke D.O.   On: 11/24/2020 18:09   DG Chest Port 1 View  Result Date: 11/24/2020 CLINICAL DATA:  Unwitnessed fall. Fever. History of prostate cancer. EXAM: PORTABLE CHEST 1 VIEW COMPARISON:   07/05/2017 FINDINGS: Shallow inspiration. Linear atelectasis in  the lung bases. Heart size and pulmonary vascularity are normal for technique. No pleural effusions. No pneumothorax. Calcified and tortuous aorta. Degenerative changes in the spine and shoulders. IMPRESSION: Shallow inspiration with linear atelectasis in the lung bases. Electronically Signed   By: Lucienne Capers M.D.   On: 11/24/2020 17:56    Pending Labs Unresulted Labs (From admission, onward)    Start     Ordered   11/25/20 0900  Hemoglobin and hematocrit, blood  Once-Timed,   STAT        11/25/20 0210   11/25/20 0454  Pathologist smear review  Once,   R        11/25/20 0454   11/25/20 0231  Hemoglobin A1c  Add-on,   AD        11/25/20 0230   11/25/20 0228  Sodium, urine, random  Add-on,   AD        11/25/20 0227   11/25/20 0228  Protein / creatinine ratio, urine  Add-on,   AD        11/25/20 0227   11/25/20 0228  Creatinine, urine, random  Add-on,   AD        11/25/20 0227   11/25/20 0227  TSH  Add-on,   AD        11/25/20 0226   11/25/20 0210  Ferritin  Add-on,   AD        11/25/20 0209   11/25/20 0210  Folate  Add-on,   AD        11/25/20 0209   11/25/20 0210  Methylmalonic acid, serum  Add-on,   AD        11/25/20 0209   11/25/20 0210  Iron and TIBC  Add-on,   AD        11/25/20 0209   11/24/20 1712  Urine culture  (Septic presentation on arrival (screening labs, nursing and treatment orders for obvious sepsis))  ONCE - STAT,   STAT        11/24/20 1712          Vitals/Pain Today's Vitals   11/24/20 2241 11/25/20 0108 11/25/20 0247 11/25/20 0400  BP:  (!) 114/54  137/66  Pulse:  70  71  Resp:  (!) 23  18  Temp: 98.7 F (37.1 C)  98.4 F (36.9 C)   TempSrc: Axillary  Axillary   SpO2:  99%  98%  Weight:      Height:      PainSc: 0-No pain       Isolation Precautions No active isolations  Medications Medications  acetaminophen (TYLENOL) tablet 650 mg (650 mg Oral Not Given 11/24/20 2119)   lactated ringers infusion ( Intravenous New Bag/Given 11/24/20 2142)  acetaminophen (TYLENOL) tablet 650 mg (has no administration in time range)    Or  acetaminophen (TYLENOL) suppository 650 mg (has no administration in time range)  cefTRIAXone (ROCEPHIN) 1 g in sodium chloride 0.9 % 100 mL IVPB (1 g Intravenous New Bag/Given 11/25/20 0300)  insulin aspart (novoLOG) injection 0-6 Units (0 Units Subcutaneous Not Given 11/25/20 0626)  lactated ringers bolus 1,000 mL (0 mLs Intravenous Stopped 11/24/20 1824)  ceFEPIme (MAXIPIME) 2 g in sodium chloride 0.9 % 100 mL IVPB (0 g Intravenous Stopped 11/24/20 1817)  metroNIDAZOLE (FLAGYL) IVPB 500 mg (0 mg Intravenous Stopped 11/24/20 2127)  vancomycin (VANCOCIN) 1,500 mg in sodium chloride 0.9 % 500 mL IVPB (0 mg Intravenous Stopped 11/24/20 2209)  acetaminophen (TYLENOL) suppository 650 mg (650 mg Rectal Given  11/24/20 2125)    Mobility non-ambulatory (baseline = ambulatory with cane per spouse)

## 2020-11-26 ENCOUNTER — Other Ambulatory Visit (HOSPITAL_COMMUNITY): Payer: Medicare PPO

## 2020-11-26 ENCOUNTER — Inpatient Hospital Stay (HOSPITAL_COMMUNITY): Payer: Medicare PPO

## 2020-11-26 DIAGNOSIS — R6520 Severe sepsis without septic shock: Secondary | ICD-10-CM | POA: Diagnosis not present

## 2020-11-26 DIAGNOSIS — A419 Sepsis, unspecified organism: Secondary | ICD-10-CM | POA: Diagnosis not present

## 2020-11-26 LAB — GLUCOSE, CAPILLARY
Glucose-Capillary: 123 mg/dL — ABNORMAL HIGH (ref 70–99)
Glucose-Capillary: 123 mg/dL — ABNORMAL HIGH (ref 70–99)
Glucose-Capillary: 79 mg/dL (ref 70–99)
Glucose-Capillary: 82 mg/dL (ref 70–99)

## 2020-11-26 LAB — PHOSPHORUS: Phosphorus: 3.8 mg/dL (ref 2.5–4.6)

## 2020-11-26 LAB — HEMOGLOBIN A1C
Hgb A1c MFr Bld: 5.2 % (ref 4.8–5.6)
Mean Plasma Glucose: 103 mg/dL

## 2020-11-26 LAB — PATHOLOGIST SMEAR REVIEW

## 2020-11-26 LAB — FOLATE: Folate: 16.1 ng/mL (ref >5.9)

## 2020-11-26 MED ORDER — METOPROLOL TARTRATE 5 MG/5ML IV SOLN
5.0000 mg | INTRAVENOUS | Status: AC | PRN
  Administered 2020-11-26: 5 mg via INTRAVENOUS
  Filled 2020-11-26: qty 5

## 2020-11-26 MED ORDER — CARBIDOPA-LEVODOPA ER 25-100 MG PO TBCR
1.5000 | EXTENDED_RELEASE_TABLET | Freq: Three times a day (TID) | ORAL | Status: DC
  Administered 2020-11-27 – 2020-12-04 (×21): 1.5 via ORAL
  Filled 2020-11-26 (×25): qty 1.5

## 2020-11-26 NOTE — Progress Notes (Signed)
PROGRESS NOTE    [name]  OEV:[id number] DOB: [birth year] DOA: 11/24/2020 PCP: Vernie Shanks, MD   Chief Complaint  Patient presents with   Code Sepsis    Brief Narrative:  87 year old male lives at home with his wife he has history of Parkinson's disease and prostate cancer history of prostatectomy CKD stage III [name] baseline creatinine is around 1.4 status post fall wife called EMS and was brought him to the hospital. Per wife at baseline they both walk 7 blocks daily.  She was concerned as patient was becoming more and more weak generalized weakness altered mental status increasing somnolence no fever chills cough nausea vomiting diarrhea.  She also noticed his oral intake was poor.   Assessment & Plan:   Principal Problem:   Severe sepsis ([place]) Active Problems:   DM2 (diabetes mellitus, type 2) (HCC)   HLD (hyperlipidemia)   Anemia   Parkinson disease ([place])   Acute cystitis   Acute metabolic encephalopathy   Lactic acidosis   Acute kidney injury superimposed on CKD (HCC)   Generalized weakness   Acute hyponatremia  #Sepsis present on admission secondary to gram-negative rod bacteremia  UTI 2/2 with gram negative rods and 1/2 bottle with gram positive cocci (enterobacter cloacae and staph epi per BCID - suspect staph epi is contaminant) Continue cefepime. Will repeat blood cultures  Follow urine cultures Suspect staph epi is contaminant    # acute metabolic encephalopathy secondary to sepsis/GNR bacteremia -COVID-negative flu negative. CT head no acute intracranial process. Delirium precautions - ok with family staying night as needed to help with delirium    # Oxygen requiement Mild, follow CXR Consider additional w/u as noted below if persistent and w/u unrevealing  # elevated D dimer - suspect related to above, follow LE [place] - consider CT PE protocol if unrevealing and renal function allows  # AKI on CKD stage IIIb due to dehydration decreased p.o. intake  his admission creatinine is 2.13 his baseline creatinine is around 1.4. Creatinine down to 1.8 from 2.13 on admit. Repeat labs in AM   # mild hyponatremia  Improved, trend, follow   # anemia likely multifactorial and chronic related to CKD.   Hb relatively stable, follow    # type 2 diabetes diet controlled check A1c A1c 5.2   # Parkinson's disease on levodopa and carbidopa restart    # hyperlipidemia on simvastatin   # thrombocytopenia his platelets were 124 on admission it is down to 104 today.  Likely from sepsis.   # Dysphagia Dysphagia 3. thin  # atrial fibrillation with RVR  In the setting of fever and rigors yesterday Follow echo TSH wnl CXR with right basilar atelectasis and small effusion Troponin mildly elevated Converted back to sinus  Hold off on anticoagulation for now with advanced age and thrombocytopenia  Goals of care Palliative care c/s  DVT prophylaxis: SCD Code Status: (full Family Communication:wife at bedside Disposition:   Status is: Inpatient  Remains inpatient appropriate because:Inpatient level of care appropriate due to severity of illness  Dispo: The patient is from: Home              Anticipated d/c is to: Home              Patient currently is not medically stable to d/c.   Difficult to place patient No       Consultants:  Palliative care  Procedures:  none  Antimicrobials:  Anti-infectives (From admission, onward)  Start     Dose/Rate Route Frequency Ordered Stop   11/25/20 1700  ceFEPIme (MAXIPIME) 2 g in sodium chloride 0.9 % 100 mL IVPB        2 g 200 mL/hr over 30 Minutes Intravenous Every 24 hours 11/25/20 1125     11/25/20 0300  cefTRIAXone (ROCEPHIN) 1 g in sodium chloride 0.9 % 100 mL IVPB  Status:  Discontinued        1 g 200 mL/hr over 30 Minutes Intravenous Every 24 hours 11/24/20 2154 11/25/20 1125   11/24/20 1730  vancomycin (VANCOCIN) 1,500 mg in sodium chloride 0.9 % 500 mL IVPB        1,500 mg 250  mL/hr over 120 Minutes Intravenous  Once 11/24/20 1716 11/24/20 2209   11/24/20 1715  ceFEPIme (MAXIPIME) 2 g in sodium chloride 0.9 % 100 mL IVPB        2 g 200 mL/hr over 30 Minutes Intravenous  Once 11/24/20 1712 11/24/20 1817   11/24/20 1715  metroNIDAZOLE (FLAGYL) IVPB 500 mg        500 mg 100 mL/hr over 60 Minutes Intravenous  Once 11/24/20 1712 11/24/20 2127   11/24/20 1715  vancomycin (VANCOCIN) IVPB 1000 mg/200 mL premix  Status:  Discontinued        1,000 mg 200 mL/hr over 60 Minutes Intravenous  Once 11/24/20 1712 11/24/20 1716      Subjective: Wife served as interpreter (she notes she is interpreter) declined other interpreter Patient denies any pain at this time  Objective: Vitals:   11/26/20 0453 11/26/20 0500 11/26/20 0834 11/26/20 1355  BP: (!) 150/67  (!) 151/63 (!) 163/61  Pulse: (!) 51  (!) 58 61  Resp: 17  14 16   Temp: 99.6 F (37.6 C)  98 F (36.7 C) 99.5 F (37.5 C)  TempSrc: Oral  Oral Oral  SpO2: 100%  100% 100%  Weight:  68.1 kg    Height:        Intake/Output Summary (Last 24 hours) at 11/26/2020 1919 Last data filed at 11/26/2020 1751 Gross per 24 hour  Intake 100 ml  Output 300 ml  Net -200 ml   Filed Weights   11/24/20 1714 11/25/20 1808 11/26/20 0500  Weight: 65.8 kg 68.2 kg 68.1 kg    Examination:  General exam: Appears calm and comfortable. Chronically ill appearing  Respiratory system: Clear to auscultation. Respiratory effort normal. Cardiovascular system: S1 & S2 heard, RRR.  Gastrointestinal system: Abdomen is nondistended, soft and nontender.  Central nervous system: Alert No focal neurological deficits. Extremities: no LEE Skin: No rashes, lesions or ulcers Psychiatry: Judgement and insight appear normal. Mood & affect appropriate.     Data Reviewed: I have personally reviewed following labs and imaging studies  CBC: Recent Labs  Lab 11/24/20 1704 11/25/20 0230 11/25/20 0454 11/25/20 1138 11/25/20 1457  WBC 11.4*   --  10.1  --  15.0*  NEUTROABS 10.3*  --  9.1*  --   --   HGB 8.6* 7.9* 8.1* 9.1* 10.2*  HCT 25.5* 23.9* 24.8* 27.4* 30.8*  MCV 99.2  --  99.6  --  99.4  PLT 124*  --  104*  --  128*    Basic Metabolic Panel: Recent Labs  Lab 11/24/20 1704 11/25/20 0454 11/25/20 1457  NA 133* 134* 135  K 4.5 4.0 4.5  CL 102 102 104  CO2 25 24 20*  GLUCOSE 111* 97 80  BUN 37* 39* 42*  CREATININE 2.13* 1.80*  1.86*  CALCIUM 8.0* 7.7* 8.0*  MG 2.1 2.0  --     GFR: Estimated Creatinine Clearance: 19.5 mL/min (Kyllie Pettijohn) (by C-G formula based on SCr of 1.86 mg/dL (H)).  Liver Function Tests: Recent Labs  Lab 11/24/20 1704 11/25/20 0454 11/25/20 1457  AST 36 40 46*  ALT 5 8 14   ALKPHOS 57 48 61  BILITOT 1.1 0.9 1.0  PROT 6.3* 5.7* 6.5  ALBUMIN 3.2* 2.6* 2.8*    CBG: Recent Labs  Lab 11/25/20 1810 11/26/20 0015 11/26/20 0606 11/26/20 1140 11/26/20 1755  GLUCAP 86 79 82 123* 123*     Recent Results (from the past 240 hour(s))  Culture, blood (Routine x 2)     Status: None (Preliminary result)   Collection Time: 11/24/20  5:04 PM   Specimen: BLOOD RIGHT ARM  Result Value Ref Range Status   Specimen Description   Final    BLOOD RIGHT ARM Performed at [facility], [place] [address], [place], [place] [address]    Special Requests   Final    BOTTLES DRAWN AEROBIC AND ANAEROBIC Blood Culture adequate volume Performed at [place] [address], [place], [place] [address]    Culture  Setup Time   Final    GRAM NEGATIVE RODS IN BOTH AEROBIC AND ANAEROBIC BOTTLES Organism ID to follow CRITICAL RESULT CALLED TO, READ BACK BY AND VERIFIED WITH: PHARMD NATHAN BATCHELDER AT 7673 ON 11/25/20 [place] KJ Performed at [facility], [place] [address] [place], [place] [address]    Culture GRAM NEGATIVE RODS  Final   Report Status PENDING  Incomplete  Blood Culture ID Panel (Reflexed)     Status: Abnormal   Collection Time: 11/24/20  5:04 PM  Result Value Ref Range  Status   Enterococcus faecalis NOT DETECTED NOT DETECTED Final   Enterococcus Faecium NOT DETECTED NOT DETECTED Final   Listeria monocytogenes NOT DETECTED NOT DETECTED Final   Staphylococcus species NOT DETECTED NOT DETECTED Final   Staphylococcus aureus (BCID) NOT DETECTED NOT DETECTED Final   Staphylococcus epidermidis NOT DETECTED NOT DETECTED Final   Staphylococcus lugdunensis NOT DETECTED NOT DETECTED Final   Streptococcus species NOT DETECTED NOT DETECTED Final   Streptococcus agalactiae NOT DETECTED NOT DETECTED Final   Streptococcus pneumoniae NOT DETECTED NOT DETECTED Final   Streptococcus pyogenes NOT DETECTED NOT DETECTED Final   Zamyah Wiesman.calcoaceticus-baumannii NOT DETECTED NOT DETECTED Final   Bacteroides fragilis NOT DETECTED NOT DETECTED Final   Enterobacterales DETECTED (March Joos) NOT DETECTED Final    Comment: Enterobacterales represent Kaydenn Mclear large order of gram negative bacteria, not Shenicka Sunderlin single organism. CRITICAL RESULT CALLED TO, READ BACK BY AND VERIFIED WITH: [place] AT 9024 ON 11/25/20 BY KJ    Enterobacter cloacae complex DETECTED (Nathanael Krist) NOT DETECTED Final    Comment: CRITICAL RESULT CALLED TO, READ BACK BY AND VERIFIED WITH: [place] AT 1117 ON 11/25/20 BY KJ    Escherichia coli NOT DETECTED NOT DETECTED Final   Klebsiella aerogenes NOT DETECTED NOT DETECTED Final   Klebsiella oxytoca NOT DETECTED NOT DETECTED Final   Klebsiella pneumoniae NOT DETECTED NOT DETECTED Final   Proteus species NOT DETECTED NOT DETECTED Final   Salmonella species NOT DETECTED NOT DETECTED Final   Serratia marcescens NOT DETECTED NOT DETECTED Final   Haemophilus influenzae NOT DETECTED NOT DETECTED Final   Neisseria meningitidis NOT DETECTED NOT DETECTED Final   Pseudomonas aeruginosa NOT DETECTED NOT DETECTED Final   Stenotrophomonas maltophilia NOT DETECTED NOT  DETECTED Final   Candida albicans NOT DETECTED NOT DETECTED Final   Candida auris NOT DETECTED NOT DETECTED  Final   Candida glabrata NOT DETECTED NOT DETECTED Final   Candida krusei NOT DETECTED NOT DETECTED Final   Candida parapsilosis NOT DETECTED NOT DETECTED Final   Candida tropicalis NOT DETECTED NOT DETECTED Final   Cryptococcus neoformans/gattii NOT DETECTED NOT DETECTED Final   CTX-M ESBL NOT DETECTED NOT DETECTED Final   Carbapenem resistance IMP NOT DETECTED NOT DETECTED Final   Carbapenem resistance KPC NOT DETECTED NOT DETECTED Final   Carbapenem resistance NDM NOT DETECTED NOT DETECTED Final   Carbapenem resist OXA 48 LIKE NOT DETECTED NOT DETECTED Final   Carbapenem resistance VIM NOT DETECTED NOT DETECTED Final    Comment: Performed at [facility], [address], [place], [place] [address]  Culture, blood (Routine x 2)     Status: None (Preliminary result)   Collection Time: 11/24/20  5:09 PM   Specimen: BLOOD LEFT ARM  Result Value Ref Range Status   Specimen Description   Final    BLOOD LEFT ARM Performed at [facility], [place] [address], [place], [place] [address]    Special Requests   Final    BOTTLES DRAWN AEROBIC AND ANAEROBIC Blood Culture adequate volume Performed at [place] [address], [place], [place] [address]    Culture  Setup Time   Final    GRAM POSITIVE COCCI GRAM NEGATIVE RODS Organism ID to follow IN BOTH AEROBIC AND ANAEROBIC BOTTLES CRITICAL RESULT CALLED TO, READ BACK BY AND VERIFIED WITH: PHARMD Shelli Portilla.PHAN AT 1239 ON 11/25/2020 BY T.SAAD. Performed at [facility], [place] [address] [place],  [address]    Culture Lonell Grandchild NEGATIVE RODS GRAM POSITIVE COCCI   Final   Report Status PENDING  Incomplete  Blood Culture ID Panel (Reflexed)     Status: Abnormal   Collection Time: 11/24/20  5:09 PM  Result Value Ref Range Status   Enterococcus faecalis NOT DETECTED NOT DETECTED Final   Enterococcus Faecium NOT DETECTED NOT DETECTED Final   Listeria monocytogenes NOT DETECTED NOT DETECTED Final    Staphylococcus species DETECTED (Dreon Pineda) NOT DETECTED Final    Comment: CRITICAL RESULT CALLED TO, READ BACK BY AND VERIFIED WITH: PHARMD Dahmir Epperly.PHAN AT 1239 ON 11/25/2020 BY T.SAAD.    Staphylococcus aureus (BCID) NOT DETECTED NOT DETECTED Final   Staphylococcus epidermidis DETECTED (Channie Bostick) NOT DETECTED Final    Comment: Methicillin (oxacillin) resistant coagulase negative staphylococcus. Possible blood culture contaminant (unless isolated from more than one blood culture draw or clinical case suggests pathogenicity). No antibiotic treatment is indicated for blood  culture contaminants. CRITICAL RESULT CALLED TO, READ BACK BY AND VERIFIED WITH: PHARMD Kewon Statler.PHAN AT 1239 ON 11/25/2020 BY T.SAAD.    Staphylococcus lugdunensis NOT DETECTED NOT DETECTED Final   Streptococcus species NOT DETECTED NOT DETECTED Final   Streptococcus agalactiae NOT DETECTED NOT DETECTED Final   Streptococcus pneumoniae NOT DETECTED NOT DETECTED Final   Streptococcus pyogenes NOT DETECTED NOT DETECTED Final   Jamil Armwood.calcoaceticus-baumannii NOT DETECTED NOT DETECTED Final   Bacteroides fragilis NOT DETECTED NOT DETECTED Final   Enterobacterales DETECTED (Avriana Joo) NOT DETECTED Final    Comment: Enterobacterales represent Davanta Meuser large order of gram negative bacteria, not Alima Naser single organism. CRITICAL RESULT CALLED TO, READ BACK BY AND VERIFIED WITH: PHARMD Tyiesha Brackney.PHAN AT 1239 ON 11/25/2020 BY T.SAAD.    Enterobacter cloacae complex DETECTED (Adonai Helzer) NOT DETECTED Final    Comment: CRITICAL  RESULT CALLED TO, READ BACK BY AND VERIFIED WITH: PHARMD Afnan Cadiente.PHAN AT 1239 ON 11/25/2020 BY T.SAAD.    Escherichia coli NOT DETECTED NOT DETECTED Final   Klebsiella aerogenes NOT DETECTED NOT DETECTED Final   Klebsiella oxytoca NOT DETECTED NOT DETECTED Final   Klebsiella pneumoniae NOT DETECTED NOT DETECTED Final   Proteus species NOT DETECTED NOT DETECTED Final   Salmonella species NOT DETECTED NOT DETECTED Final   Serratia marcescens NOT DETECTED NOT DETECTED Final    Haemophilus influenzae NOT DETECTED NOT DETECTED Final   Neisseria meningitidis NOT DETECTED NOT DETECTED Final   Pseudomonas aeruginosa NOT DETECTED NOT DETECTED Final   Stenotrophomonas maltophilia NOT DETECTED NOT DETECTED Final   Candida albicans NOT DETECTED NOT DETECTED Final   Candida auris NOT DETECTED NOT DETECTED Final   Candida glabrata NOT DETECTED NOT DETECTED Final   Candida krusei NOT DETECTED NOT DETECTED Final   Candida parapsilosis NOT DETECTED NOT DETECTED Final   Candida tropicalis NOT DETECTED NOT DETECTED Final   Cryptococcus neoformans/gattii NOT DETECTED NOT DETECTED Final   CTX-M ESBL NOT DETECTED NOT DETECTED Final   Carbapenem resistance IMP NOT DETECTED NOT DETECTED Final   Carbapenem resistance KPC NOT DETECTED NOT DETECTED Final   Methicillin resistance mecA/C DETECTED (Yeiden Frenkel) NOT DETECTED Final    Comment: CRITICAL RESULT CALLED TO, READ BACK BY AND VERIFIED WITH: PHARMD Donnell Beauchamp.PHAN AT 1239 ON 11/25/2020 BY T.SAAD.    Carbapenem resistance NDM NOT DETECTED NOT DETECTED Final   Carbapenem resist OXA 48 LIKE NOT DETECTED NOT DETECTED Final   Carbapenem resistance VIM NOT DETECTED NOT DETECTED Final    Comment: Performed at [facility], [place] [address] [place], [place] [address]  Resp Panel by RT-PCR (Flu Tymothy Cass&B, Covid) Nasopharyngeal Swab     Status: None   Collection Time: 11/24/20  5:12 PM   Specimen: Nasopharyngeal Swab; Nasopharyngeal(NP) swabs in vial transport medium  Result Value Ref Range Status   SARS Coronavirus 2 by RT PCR NEGATIVE NEGATIVE Final    Comment: (NOTE) SARS-CoV-2 target nucleic acids are NOT DETECTED.  The SARS-CoV-2 RNA is generally detectable in upper respiratory specimens during the acute phase of infection. The lowest concentration of SARS-CoV-2 viral copies this assay can detect is 138 copies/mL. Brina Umeda negative result does not preclude SARS-Cov-2 infection and should not be used as the sole basis for treatment or other patient  management decisions. Jelicia Nantz negative result may occur with  improper specimen collection/handling, submission of specimen other than nasopharyngeal swab, presence of viral mutation(s) within the areas targeted by this assay, and inadequate number of viral copies(<138 copies/mL). Scott Fix negative result must be combined with clinical observations, patient history, and epidemiological information. The expected result is Negative.  Fact Sheet for Patients:  EntrepreneurPulse.com.au  Fact Sheet for Healthcare Providers:  IncredibleEmployment.be  This test is no t yet approved or cleared by the [place] FDA and  has been authorized for detection and/or diagnosis of SARS-CoV-2 by FDA under an Emergency Use Authorization (EUA). This EUA will remain  in effect (meaning this test can be used) for the duration of the COVID-19 declaration under Section 564(b)(1) of the Act, 21 U.S.C.section 360bbb-3(b)(1), unless the authorization is terminated  or revoked sooner.       Influenza Aubriel Khanna by PCR NEGATIVE NEGATIVE Final   Influenza B by PCR NEGATIVE NEGATIVE Final    Comment: (NOTE) The Xpert Xpress SARS-CoV-2/FLU/RSV plus assay is intended as an aid in the diagnosis of influenza from Nasopharyngeal swab specimens  and should not be used as Makayleigh Poliquin sole basis for treatment. Nasal washings and aspirates are unacceptable for Xpert Xpress SARS-CoV-2/FLU/RSV testing.  Fact Sheet for Patients: EntrepreneurPulse.com.au  Fact Sheet for Healthcare Providers: IncredibleEmployment.be  This test is not yet approved or cleared by the [place] FDA and has been authorized for detection and/or diagnosis of SARS-CoV-2 by FDA under an Emergency Use Authorization (EUA). This EUA will remain in effect (meaning this test can be used) for the duration of the COVID-19 declaration under Section 564(b)(1) of the Act, 21 U.S.C. section 360bbb-3(b)(1),  unless the authorization is terminated or revoked.  Performed at [facility], [place] [address], [place], [place] [address]   Urine culture     Status: Abnormal (Preliminary result)   Collection Time: 11/24/20  8:30 PM   Specimen: In/Out Cath Urine  Result Value Ref Range Status   Specimen Description   Final    IN/OUT CATH URINE Performed at [place] [address], [place], [place] [address]    Special Requests   Final    NONE Performed at [facility], [place] [address], [place], [place] [address]    Culture (Lulu Hirschmann)  Final    60,000 COLONIES/mL Lonell Grandchild NEGATIVE RODS SUSCEPTIBILITIES TO FOLLOW Performed at [facility], [place] [address], [place], [place] [address]    Report Status PENDING  Incomplete         Radiology Studies: DG Chest 1 View  Result Date: 11/25/2020 CLINICAL DATA:  Increasing weakness EXAM: CHEST  1 VIEW COMPARISON:  11/24/2020 FINDINGS: Cardiac shadow is stable. Aortic calcifications are seen. Right basilar atelectasis is noted with mild blunting of the right costophrenic angle. Left lung is clear. No bony abnormality is seen. IMPRESSION: Right basilar atelectasis and small effusion. Electronically Signed   By: Inez Catalina M.D.   On: 11/25/2020 15:31   DG Swallowing Func-Speech Pathology  Result Date: 11/26/2020 Formatting of this result is different from the original. Objective Swallowing Evaluation: Type of Study: MBS-Modified Barium Swallow Study  Patient Details Name: Aki Abalos MRN: [id number] Date of Birth: 1918/09/27 Today's Date: 11/26/2020 Time: SLP Start Time (ACUTE ONLY): 1233 -SLP Stop Time (ACUTE ONLY): 1300 SLP Time Calculation (min) (ACUTE ONLY): 27 min Past Medical History: Past Medical History: Diagnosis Date  Allergy   rhinitis  Diabetes mellitus   dx 3-11, A1C 6.7  Gout   Hiatal hernia   Hyperlipidemia   Parkinson disease ([place]) 10/23/2015  Prostate CA ([place]) 2002  s/p  x-ray therapy and  surgery  Skin cancer   Trigeminal neuralgia   h/o , status post gamma knife procedure and trial with multiple drugs Past Surgical History: Past Surgical History: Procedure Laterality Date  CHOLECYSTECTOMY   HPI: Mr Grisso is Raysean Graumann 87 yo male adm to [facility] with AMS, UTI after 2 days of somnolence and confusion at home.  CXR showed right basilar ATX and effusion.  Pt also with h/o prostate cancer, large hiatal hernia repair, distal esophageal diverticulum.  He resides with his wife at home.  Swallow eval ordered. Wife and pt deny pt having h/o dysphagia, sensing reflux or coughing with intake.  Wife does report he lost weight prior to admission.  Plan for palliative meeting todoay. Rapid response called yesterday due to pt having rigors, tachypnea, and low grade fever.  Subjective: pt awake in bed, wife present and desired mbs Assessment / Plan / Recommendation CHL IP CLINICAL IMPRESSIONS 11/26/2020 Clinical Impression Mild oropharyngoesophageal dysphagia noted  with trace aspiration of thin due to decreased oral control allowing spillage into larynx prior to swallow.  Aspiration with thin was just below vocal cords, cleared with cued cough and was prevented with small single sips of liquids.  Mildly prolonged mastication and oral transiting of cracker noted.   Pharyngeal swallow is strong without pharyngeal retention of solids including pudding and cracker.  Prominent CP/cricopharyngeal bar with suspected developing diverticulum likley due to decreased UES relaxation noted.  Thus cervical esophageal backflow of thin observed without pt sensation.  He did not aspirate mild pyriform sinus//CP backflowed material but certainly is at risk.  Upon esophageal sweep, pt appeared with esophageal diverticulum (noted in his medical record) and suspected mild motility issues - but radiologist not present to confirm and this test does not assess esophageal phase of swallow.  Suspect pt's primary symptoms are due to his cervical esophageal  dysphagia and reviewed compensation strategies with his wife using video and teach back.  Advised pt consume drinks that are slightly thicker with his meals *eg V8, OJ, coffee with cream, Ensure, etc if increased comfort, decreased coughing with intake.  Recommend thin drinks (eg water) between meals for hydration.  Pt will benefit from staying upright after meals (wife reports he lays down at home after eating) and consuming frequent small meals as able.  Crushing medications may help to decr risk of whole lodging at cricopharyngeal region and potentially backflowing/aspirated.  Will follow up for dysphagia management/education.  Pt's swallow ability instrumentally was much better than presented clinically.  He did NOT cough reflexively during his MBS. SLP Visit Diagnosis Dysphagia, pharyngoesophageal phase (R13.14);Dysphagia, oropharyngeal phase (R13.12) Attention and concentration deficit following -- Frontal lobe and executive function deficit following -- Impact on safety and function Moderate aspiration risk   CHL IP TREATMENT RECOMMENDATION 11/26/2020 Treatment Recommendations Therapy as outlined in treatment plan below   Prognosis 11/26/2020 Prognosis for Safe Diet Advancement Good Barriers to Reach Goals Time post onset Barriers/Prognosis Comment -- CHL IP DIET RECOMMENDATION 11/26/2020 SLP Diet Recommendations SLP Diet Recommendations: Dysphagia 3 (Mech soft) solids;Thin liquid - slightly thicker drinks with meals if prevents coughing, thin water between meals for hydration  Liquid Administration via: Cup;Straw  Medication Administration: Crushed with puree  Supervision: Patient able to self feed  Compensations: Slow rate;Small sips/bites  Postural Changes: Remain semi-upright after after feeds/meals (Comment);Seated upright at 90 degrees; small frequent meals  Oral Care Recommendations: Oral care BID Liquid Administration via  Medication Administration  Compensations  Postural Changes    CHL IP OTHER  RECOMMENDATIONS 11/26/2020 Recommended Consults -- Oral Care Recommendations Oral care BID Other Recommendations --   CHL IP FOLLOW UP RECOMMENDATIONS 11/26/2020 Follow up Recommendations None   CHL IP FREQUENCY AND DURATION 11/26/2020 Speech Therapy Frequency (ACUTE ONLY) min 1 x/week Treatment Duration 1 week      CHL IP ORAL PHASE 11/26/2020 Oral Phase Impaired Oral - Pudding Teaspoon -- Oral - Pudding Cup -- Oral - Honey Teaspoon -- Oral - Honey Cup -- Oral - Nectar Teaspoon WFL;Decreased bolus cohesion;Premature spillage Oral - Nectar Cup [facility];Decreased bolus cohesion;Premature spillage Oral - Nectar Straw -- Oral - Thin Teaspoon WFL;Premature spillage;Decreased bolus cohesion Oral - Thin Cup [facility];Decreased bolus cohesion;Premature spillage Oral - Thin Straw WFL;Premature spillage;Decreased bolus cohesion Oral - Puree WFL Oral - Mech Soft Delayed oral transit;Impaired mastication;Lingual pumping Oral - Regular -- Oral - Multi-Consistency -- Oral - Pill -- Oral Phase - Comment --  CHL IP PHARYNGEAL PHASE 11/26/2020 Pharyngeal Phase Impaired Pharyngeal-  Pudding Teaspoon -- Pharyngeal -- Pharyngeal- Pudding Cup -- Pharyngeal -- Pharyngeal- Honey Teaspoon -- Pharyngeal -- Pharyngeal- Honey Cup -- Pharyngeal -- Pharyngeal- Nectar Teaspoon WFL Pharyngeal Material does not enter airway Pharyngeal- Nectar Cup [facility];Pharyngeal residue - cp segment Pharyngeal Material does not enter airway Pharyngeal- Nectar Straw Pharyngeal residue - cp segment;Delayed swallow initiation-vallecula Pharyngeal Material does not enter airway Pharyngeal- Thin Teaspoon WFL;Delayed swallow initiation-pyriform sinuses;Pharyngeal residue - cp segment Pharyngeal Material does not enter airway Pharyngeal- Thin Cup Delayed swallow initiation-pyriform sinuses;Reduced laryngeal elevation;Reduced airway/laryngeal closure;Penetration/Aspiration during swallow;Pharyngeal residue - cp segment Pharyngeal Material enters airway, remains ABOVE vocal cords then  ejected out Pharyngeal- Thin Straw Delayed swallow initiation-pyriform sinuses;Reduced airway/laryngeal closure;Reduced laryngeal elevation;Penetration/Aspiration before swallow;Trace aspiration;Pharyngeal residue - cp segment Pharyngeal -- Pharyngeal- Puree WFL Pharyngeal Material does not enter airway Pharyngeal- Mechanical Soft WFL Pharyngeal Material does not enter airway Pharyngeal- Regular -- Pharyngeal -- Pharyngeal- Multi-consistency -- Pharyngeal -- Pharyngeal- Pill -- Pharyngeal -- Pharyngeal Comment use of cup prevents trace amount of aspiration, disoordinated swallow with thin allowed liquids to spill into larynx prior to swallow trigger - very trace amount of aspiration of thin that cleared  CHL IP CERVICAL ESOPHAGEAL PHASE 11/26/2020 Cervical Esophageal Phase Impaired Pudding Teaspoon -- Pudding Cup -- Honey Teaspoon -- Honey Cup -- Nectar Teaspoon -- Nectar Cup Reduced cricopharyngeal relaxation;Prominent cricopharyngeal segment Nectar Straw Reduced cricopharyngeal relaxation;Prominent cricopharyngeal segment Thin Teaspoon Reduced cricopharyngeal relaxation;Prominent cricopharyngeal segment Thin Cup Prominent cricopharyngeal segment;Reduced cricopharyngeal relaxation;Esophageal backflow into cervical esophagus Thin Straw Reduced cricopharyngeal relaxation;Prominent cricopharyngeal segment;Esophageal backflow into cervical esophagus Puree Reduced cricopharyngeal relaxation Mechanical Soft Reduced cricopharyngeal relaxation Regular -- Multi-consistency -- Pill -- Cervical Esophageal Comment -- Macario Golds 11/26/2020, 2:15 PM                   Scheduled Meds:  acetaminophen  650 mg Oral Once   insulin aspart  0-6 Units Subcutaneous Q6H   Continuous Infusions:  ceFEPime (MAXIPIME) IV 2 g (11/26/20 1658)     LOS: 2 days    Time spent: over 30 min    Fayrene Helper, MD [facility]   To contact the attending provider between 7A-7P or the covering provider during after  hours 7P-7A, please log into the web site [email or web address] and access using universal [place] password for that web site. If you do not have the password, please call the hospital operator.  11/26/2020, 7:19 PM

## 2020-11-26 NOTE — Progress Notes (Signed)
Palliative Medicine Team consult was received.   Chart reviewed and I checked in on [name] this evening.  No family was present at the bedside at that time.  We have scheduled a meeting with patient and family for tomorrow at 11:30AM. If there are urgent needs or questions please call [phone]. Thank you for consulting out team to assist with this patients care.  Micheline Rough, MD [place] Palliative Medicine Team [phone]  NO CHARGE NOTE

## 2020-11-26 NOTE — Progress Notes (Signed)
   11/26/20 0039  Assess: MEWS Score  Temp 99.5 F (37.5 C)  BP (!) 165/79  Pulse Rate (!) 112  Resp (!) 22  SpO2 100 %  Assess: MEWS Score  MEWS Temp 0  MEWS Systolic 0  MEWS Pulse 2  MEWS RR 1  MEWS LOC 1  MEWS Score 4  MEWS Score Color Red  Assess: if the MEWS score is Yellow or Red  Were vital signs taken at a resting state? Yes  Focused Assessment Change from prior assessment (see assessment flowsheet)  Treat  MEWS Interventions Escalated (See documentation below)  Pain Score Asleep  Faces Pain Scale 0  Take Vital Signs  Increase Vital Sign Frequency  Red: Q 1hr X 4 then Q 4hr X 4, if remains red, continue Q 4hrs  Escalate  MEWS: Escalate Red: discuss with charge nurse/RN and provider, consider discussing with RRT  Notify: Charge Nurse/RN  Name of Charge Nurse/RN Notified  Carmin Richmond, RN)  Date Charge Nurse/RN Notified 11/26/20  Time Charge Nurse/RN Notified 0044  Notify: Provider  Provider Name/Title Ardith Dark NP  Date Provider Notified 11/26/20  Time Provider Notified [phone]  Notification Type  (secure chat)  Notification Reason Change in status  Notify: Rapid Response  Name of Rapid Response RN Notified Mandy, RN (Nurse stated that interventions are being done that needs to be done. Nurse stated that maybe patient needs more metoprolol. will continue to monitor and await orders.)  Date Rapid Response Notified 11/26/20  Time Rapid Response Notified 4680  Assess: SIRS CRITERIA  SIRS Temperature  0  SIRS Pulse 1  SIRS Respirations  1  SIRS WBC 0  SIRS Score Sum  2  Informed charge nurse of red mews status. NP notified by secure chat and rapid response nurse was informed. Will continue to monitor and await orders.

## 2020-11-26 NOTE — Progress Notes (Addendum)
MBS completed. Full report to follow.  Mild oropharyngoesophageal dysphagia noted with trace aspiration of thin -  prevented with small single cup boluses.  Prominent CP noted with backflow of thin.  Full report to follow.    Kathleen Lime, MS [facility] SLP Acute Rehab Services Office [phone] Pager [phone]

## 2020-11-26 NOTE — Evaluation (Signed)
Clinical/Bedside Swallow Evaluation Patient Details  Name: [name] MRN: [id number] Date of Birth: 13-Dec-1918  Today's Date: 11/26/2020 Time: SLP Start Time (ACUTE ONLY): 0917 SLP Stop Time (ACUTE ONLY): 1010 SLP Time Calculation (min) (ACUTE ONLY): 53 min  Past Medical History:  Past Medical History:  Diagnosis Date   Allergy    rhinitis   Diabetes mellitus    dx 3-11, A1C 6.7   Gout    Hiatal hernia    Hyperlipidemia    Parkinson disease ([place]) 10/23/2015   Prostate CA ([place]) 2002   s/p  x-ray therapy and surgery   Skin cancer    Trigeminal neuralgia    h/o , status post gamma knife procedure and trial with multiple drugs   Past Surgical History:  Past Surgical History:  Procedure Laterality Date   CHOLECYSTECTOMY     HPI:  [name] is a 87 yo male adm to [facility] with AMS, UTI after 2 days of somnolence and confusion at home.  CXR showed right basilar ATX and effusion.  Pt also with h/o prostate cancer, large hiatal hernia repair, distal esophageal diverticulum.  He resides with his wife at home.  Swallow eval ordered. Wife and pt deny pt having h/o dysphagia, sensing reflux or coughing with intake.  Wife does report he lost weight prior to admission.  Plan for palliative meeting todoay. Rapid response called yesterday due to pt having rigors, tachypnea, and low grade fever.   Assessment / Plan / Recommendation Clinical Impression  Pt presents with clinical indications concerning for mild oral and moderate pharyngeal dysphagia. Oral care provided after SlP set up oral suction - clearing white viscous secretions from hard palate.  Primary concern is potential aspiration of thin water via cup/straw c/b strong cough immediately post-swallow. Decreased labial clearance of applesauce on right - ? facial nerve/trigeminal nerve deficits.  Pt's voice is mildly hoarse and weak but he is able to project with cues.  Oral transiting/mastication of solid was marginally prolonged with delayed  cough also.  No indications of severe dysphagia nor aspiration with dys1/nectar diet.     Recommend either consider diet of dys1/nectar for now and assess tolerance vs MBS to allow instrumental testing.    Advised diet modification may help maximize pt's comfort currently and can be changed as indicated/desired especailly given GOC ordered.  Reviewed with wife that MBS may not change pt's outcomes, care plan and given pt/wife were not concerned regarding overt coughing with water - causing SlP to suspect component of chronic deficits/dysphagia.  Spouse desired MBS - informed MD and received approval for testing.  Wife reports pt is much improved today - as he indicated he missed the warmth of home and he remained alert during entire evaluation.  Educated wife to use of oral suction using teach back and temporary use of thickener.     SLP Visit Diagnosis: Dysphagia, oropharyngeal phase (R13.12)    Aspiration Risk  Moderate aspiration risk    Diet Recommendation Dysphagia 1 (Puree);Nectar-thick liquid   Liquid Administration via: Straw;Cup Medication Administration: Whole meds with puree (crush if large) Supervision: Patient able to self feed Compensations: Slow rate;Small sips/bites Postural Changes: Seated upright at 90 degrees;Remain upright for at least 30 minutes after po intake    Other  Recommendations Oral Care Recommendations: Oral care BID   Follow up Recommendations    TBD    Frequency and Duration   TBD         Prognosis Prognosis for Safe Diet Advancement:  Fair      Swallow Study   General Date of Onset: 11/26/20 HPI: [name] is a 87 yo male adm to [facility] with AMS, UTI after 2 days of somnolence and confusion at home.  CXR showed right basilar ATX and effusion.  Pt also with h/o prostate cancer, large hiatal hernia repair, distal esophageal diverticulum.  He resides with his wife at home.  Swallow eval ordered. Wife and pt deny pt having h/o dysphagia, sensing reflux or  coughing with intake.  Wife does report he lost weight prior to admission.  Plan for palliative meeting todoay. Rapid response called yesterday due to pt having rigors, tachypnea, and low grade fever. Type of Study: Bedside Swallow Evaluation Previous Swallow Assessment: see hpi Temperature Spikes Noted: No Respiratory Status: Nasal cannula History of Recent Intubation: No Behavior/Cognition: Alert;Cooperative Oral Care Completed by SLP: Yes (extensive oral care completed with oral suction and pt brushing his teeth, dentures replaced by pt after oral care) Oral Cavity - Dentition: Dentures, top;Adequate natural dentition Vision: Impaired for self-feeding Self-Feeding Abilities: Needs assist Patient Positioning: Upright in bed Baseline Vocal Quality: Hoarse;Low vocal intensity Volitional Cough: Strong Volitional Swallow: Able to elicit (delayed)    Oral/Motor/Sensory Function Overall Oral Motor/Sensory Function: Generalized oral weakness (? mild right labial decreased closure, of items could assess, pt made great effort to follow direction) Facial Sensation: Within Functional Limits Lingual ROM: Other (Comment) Lingual Symmetry: Within Functional Limits Velum: Other (comment);Suspected CN X (Vagus) dysfunction (appeared sluggish but bilateral) Mandible: Within Functional Limits   Ice Chips Ice chips: Not tested Other Comments: pt does not consume ice   Thin Liquid Thin Liquid: Impaired Presentation: Cup;Self Fed;Spoon;Straw Pharyngeal  Phase Impairments: Cough - Immediate    Nectar Thick Nectar Thick Liquid: Impaired Presentation: Cup;Straw;Spoon Pharyngeal Phase Impairments: Multiple swallows   Honey Thick Honey Thick Liquid: Not tested   Puree Puree: Within functional limits Presentation: Self Fed;Spoon Pharyngeal Phase Impairments: Multiple swallows Other Comments: occasional multiple swallows   Solid     Solid: Impaired Oral Phase Impairments: Impaired mastication Oral  Phase Functional Implications: Impaired mastication;Prolonged oral transit Pharyngeal Phase Impairments: Cough - Delayed;Multiple swallows      Macario Golds 11/26/2020,10:58 AM  Kathleen Lime, MS [place] Office [phone] Pager [phone]

## 2020-11-26 NOTE — Progress Notes (Signed)
Modified Barium Swallow Progress Note  Patient Details  Name: [name] MRN: [id number] Date of Birth: 1919-01-05  Today's Date: 11/26/2020  Modified Barium Swallow completed.  Full report located under Chart Review in the Imaging Section.  Brief recommendations include the following:  Clinical Impression  Mild oropharyngoesophageal dysphagia noted with trace aspiration of thin due to decreased oral control allowing spillage into larynx prior to swallow.  Aspiration with thin was just below vocal cords, cleared with cued cough and was prevented with small single sips of liquids.  Mildly prolonged mastication and oral transiting of cracker noted.   Pharyngeal swallow is strong without pharyngeal retention of solids including pudding and cracker.  Prominent CP/cricopharyngeal bar with suspected developing diverticulum likley due to decreased UES relaxation noted.  Thus cervical esophageal backflow of thin observed without pt sensation.  He did not aspirate mild pyriform sinus//CP backflowed material but certainly is at risk.  Upon esophageal sweep, pt appeared with esophageal diverticulum (noted in his medical record) and suspected mild motility issues - but radiologist not present to confirm and this test does not assess esophageal phase of swallow.  Suspect pt's primary symptoms are due to his cervical esophageal dysphagia and reviewed compensation strategies with his wife using video and teach back.  Advised pt consume drinks that are slightly thicker with his meals *eg V8, OJ, coffee with cream, Ensure, etc if increased comfort, decreased coughing with intake.  Recommend thin drinks (eg water) between meals for hydration.  Pt will benefit from staying upright after meals (wife reports he lays down at home after eating) and consuming frequent small meals as able.  Crushing medications may help to decr risk of whole lodging at cricopharyngeal region and potentially backflowing/aspirated.  Will follow  up for dysphagia management/education.  Pt's swallow ability instrumentally was much better than presented clinically.  He did NOT cough reflexively during his MBS.   Swallow Evaluation Recommendations       SLP Diet Recommendations: Dysphagia 3 (Mech soft) solids;Thin liquid - slightly thicker drinks with meals if prevents coughing, thin water between meals for hydration   Liquid Administration via: Cup;Straw   Medication Administration: Crushed with puree   Supervision: Patient able to self feed   Compensations: Slow rate;Small sips/bites   Postural Changes: Remain semi-upright after after feeds/meals (Comment);Seated upright at 90 degrees   Oral Care Recommendations: Oral care BID      Kathleen Lime, MS [facility] SLP Acute Rehab Services Office [phone] Pager [phone]   Macario Golds 11/26/2020,2:08 PM

## 2020-11-27 ENCOUNTER — Other Ambulatory Visit: Payer: Self-pay

## 2020-11-27 ENCOUNTER — Inpatient Hospital Stay (HOSPITAL_COMMUNITY): Payer: Medicare PPO

## 2020-11-27 DIAGNOSIS — G9341 Metabolic encephalopathy: Secondary | ICD-10-CM | POA: Diagnosis not present

## 2020-11-27 DIAGNOSIS — Z7189 Other specified counseling: Secondary | ICD-10-CM

## 2020-11-27 DIAGNOSIS — R7989 Other specified abnormal findings of blood chemistry: Secondary | ICD-10-CM

## 2020-11-27 DIAGNOSIS — N179 Acute kidney failure, unspecified: Secondary | ICD-10-CM | POA: Diagnosis not present

## 2020-11-27 DIAGNOSIS — A419 Sepsis, unspecified organism: Secondary | ICD-10-CM | POA: Diagnosis not present

## 2020-11-27 DIAGNOSIS — R6520 Severe sepsis without septic shock: Secondary | ICD-10-CM | POA: Diagnosis not present

## 2020-11-27 DIAGNOSIS — Z515 Encounter for palliative care: Secondary | ICD-10-CM

## 2020-11-27 LAB — CULTURE, BLOOD (ROUTINE X 2)
Special Requests: ADEQUATE
Special Requests: ADEQUATE

## 2020-11-27 LAB — COMPREHENSIVE METABOLIC PANEL
ALT: 18 U/L (ref 0–44)
AST: 22 U/L (ref 15–41)
Albumin: 2.3 g/dL — ABNORMAL LOW (ref 3.5–5.0)
Alkaline Phosphatase: 52 U/L (ref 38–126)
Anion gap: 8 (ref 5–15)
BUN: 61 mg/dL — ABNORMAL HIGH (ref 8–23)
CO2: 22 mmol/L (ref 22–32)
Calcium: 7.8 mg/dL — ABNORMAL LOW (ref 8.9–10.3)
Chloride: 109 mmol/L (ref 98–111)
Creatinine, Ser: 1.68 mg/dL — ABNORMAL HIGH (ref 0.61–1.24)
GFR, Estimated: 36 mL/min — ABNORMAL LOW (ref >60)
Glucose, Bld: 127 mg/dL — ABNORMAL HIGH (ref 70–99)
Potassium: 3.6 mmol/L (ref 3.5–5.1)
Sodium: 139 mmol/L (ref 135–145)
Total Bilirubin: 0.6 mg/dL (ref 0.3–1.2)
Total Protein: 5.7 g/dL — ABNORMAL LOW (ref 6.5–8.1)

## 2020-11-27 LAB — CBC WITH DIFFERENTIAL/PLATELET
Abs Immature Granulocytes: 0.03 10*3/uL (ref 0.00–0.07)
Basophils Absolute: 0 10*3/uL (ref 0.0–0.1)
Basophils Relative: 0 %
Eosinophils Absolute: 0 10*3/uL (ref 0.0–0.5)
Eosinophils Relative: 1 %
HCT: 24.3 % — ABNORMAL LOW (ref 39.0–52.0)
Hemoglobin: 8 g/dL — ABNORMAL LOW (ref 13.0–17.0)
Immature Granulocytes: 1 %
Lymphocytes Relative: 3 %
Lymphs Abs: 0.2 10*3/uL — ABNORMAL LOW (ref 0.7–4.0)
MCH: 33.1 pg (ref 26.0–34.0)
MCHC: 32.9 g/dL (ref 30.0–36.0)
MCV: 100.4 fL — ABNORMAL HIGH (ref 80.0–100.0)
Monocytes Absolute: 0.5 10*3/uL (ref 0.1–1.0)
Monocytes Relative: 7 %
Neutro Abs: 5.5 10*3/uL (ref 1.7–7.7)
Neutrophils Relative %: 88 %
Platelets: 111 10*3/uL — ABNORMAL LOW (ref 150–400)
RBC: 2.42 MIL/uL — ABNORMAL LOW (ref 4.22–5.81)
RDW: 14.6 % (ref 11.5–15.5)
WBC: 6.2 10*3/uL (ref 4.0–10.5)
nRBC: 0 % (ref 0.0–0.2)

## 2020-11-27 LAB — GLUCOSE, CAPILLARY
Glucose-Capillary: 128 mg/dL — ABNORMAL HIGH (ref 70–99)
Glucose-Capillary: 137 mg/dL — ABNORMAL HIGH (ref 70–99)
Glucose-Capillary: 138 mg/dL — ABNORMAL HIGH (ref 70–99)
Glucose-Capillary: 144 mg/dL — ABNORMAL HIGH (ref 70–99)

## 2020-11-27 LAB — URINE CULTURE: Culture: 60000 — AB

## 2020-11-27 LAB — PHOSPHORUS: Phosphorus: 3.6 mg/dL (ref 2.5–4.6)

## 2020-11-27 LAB — MAGNESIUM: Magnesium: 2.3 mg/dL (ref 1.7–2.4)

## 2020-11-27 LAB — TROPONIN I (HIGH SENSITIVITY): Troponin I (High Sensitivity): 75 ng/L — ABNORMAL HIGH (ref <18)

## 2020-11-27 MED ORDER — DEXTROSE IN LACTATED RINGERS 5 % IV SOLN: INTRAVENOUS | Status: DC

## 2020-11-27 MED ORDER — ENSURE ENLIVE PO LIQD
237.0000 mL | Freq: Two times a day (BID) | ORAL | Status: DC
  Administered 2020-11-27 – 2020-12-03 (×10): 237 mL via ORAL

## 2020-11-27 NOTE — Progress Notes (Signed)
MD made aware that RN placed EKG strip in chart.

## 2020-11-27 NOTE — Care Management Important Message (Signed)
Important Message  Patient Details IM Letter given to the Patient. Name: [name] MRN: [id number] Date of Birth: [birth year]   Medicare Important Message Given:  Yes     Kerin Salen 11/27/2020, 9:47 AM

## 2020-11-27 NOTE — Consult Note (Addendum)
Palliative care consult note  Reason for consult: Goals of care in light of altered mental status and sepsis  Palliative care consult received.  Chart reviewed including personal review of pertinent labs and imaging.  Discussed case this a.m. with Dr. Florene Glen.  Briefly, Mr. Belmar is a 87 year old male with past medical history of Parkinson's disease, prostate cancer status post prostatectomy, CKD stage III who had a fall at home and was brought to the hospital.  He is very functional at baseline and they walk 7 blocks daily.  He is currently admitted with bacteremia secondary to UTI.  Palliative consulted for goals of care.  I met today with patient, his wife, his son, and daughter via phone in conjunction with Dr. Florene Glen.  We discussed clinical course as well as wishes moving forward in regard to care plan this hospitalization.  Concepts specific to code status and rehospitalization discussed.  We discussed difference between a aggressive medical intervention path and a palliative, comfort focused care path.  Values and goals of care important to patient and family were attempted to be elicited.  Initially, patient's wife was wanting to take him home as she felt as though he may imminently die.  We discussed that his situation is complex due to the fact that he is frail and elderly with significant illness, however, he also has a very functional baseline and what appears to be treatable problems.  We discussed plan to continue with current interventions for another 24 hours and plan for further discussion tomorrow at 1130 to determine next steps.  If he continues to decline, his wife indicates she does want to work to try and get him home with hospice support.  We next discussed that in light of multiple chronic medical problems that have worsened with this acute problem, care should be focused on interventions that are likely to allow the patient to achieve goal of getting back to home and spending  time with family. We discussed with family regarding heroic interventions at the end-of-life and they agree this would not be likely to lead to getting well enough to go back home. They were in agreement with changing CODE STATUS to DO NOT RESUSCITATE.  Concept of Hospice and Palliative Care were discussed   Questions and concerns addressed.   PMT will continue to support holistically.  - DNR/DNI - Continue current interventions for another 24 hours and plan to reassess his situation tomorrow. - Plan for follow-up meeting tomorrow at 1130AM.  Start time: 1120 End time: 1220 Total time: 60 minutes  Greater than 50%  of this time was spent counseling and coordinating care related to the above assessment and plan.  Micheline Rough, MD [place] Team [phone]

## 2020-11-27 NOTE — Consult Note (Deleted)
Palliative care consult note  Reason for consult: Goals of care in light of altered mental status and sepsis  Palliative care consult received.  Chart reviewed including personal review of pertinent labs and imaging.  Discussed case this a.m. with Dr. Florene Glen.  Briefly, [name] is a 87 year old male with past medical history of Parkinson's disease, prostate cancer status post prostatectomy, CKD stage III who had a fall at home and was brought to the hospital.  He is very functional at baseline and they walk 7 blocks daily.  He is currently admitted with bacteremia secondary to UTI.  Palliative consulted for goals of care.  I met today with patient, his wife, his son, and daughter via phone in conjunction with Dr. Florene Glen.  We discussed clinical course as well as wishes moving forward in regard to care plan this hospitalization.  Concepts specific to code status and rehospitalization discussed.  We discussed difference between a aggressive medical intervention path and a palliative, comfort focused care path.  Values and goals of care important to patient and family were attempted to be elicited.  Initially, patient's wife was wanting to take him home as she felt as though he may imminently die.  We discussed that his situation is complex due to the fact that he is frail and elderly with significant illness, however, he also has a very functional baseline and what appears to be treatable problems.  We discussed plan to continue with current interventions for another 24 hours and plan for further discussion tomorrow at 1130 to determine next steps.  If he continues to decline, his wife indicates she does want to work to try and get him home with hospice support.  We next discussed that in light of multiple chronic medical problems that have worsened with this acute problem, care should be focused on interventions that are likely to allow the patient to achieve goal of getting back to home and spending  time with family. We discussed with family regarding heroic interventions at the end-of-life and they agree this would not be likely to lead to getting well enough to go back home. They were in agreement with changing CODE STATUS to DO NOT RESUSCITATE.  Concept of Hospice and Palliative Care were discussed   Questions and concerns addressed.   PMT will continue to support holistically.  - DNR/DNI - Continue current interventions for another 24 hours and plan to reassess his situation tomorrow. - Plan for follow-up meeting tomorrow at 1130AM.  Start time: 1120 End time: 1220 Total time: 60 minutes  Greater than 50%  of this time was spent counseling and coordinating care related to the above assessment and plan.  Timothy Rough, MD [place] Team [phone]

## 2020-11-27 NOTE — Progress Notes (Signed)
SLP Cancellation Note  Patient Details Name: [name] MRN: [id number] DOB: 08/06/1918   Cancelled treatment:       Reason Eval/Treat Not Completed: Other (comment) (pt sleeping at time of SLP attempt for treatment, education and wife currently not present, note change in pt's code status)  Kathleen Lime, MS [place] Office [phone] Pager [phone]  Macario Golds 11/27/2020, 8:06 PM

## 2020-11-27 NOTE — Progress Notes (Signed)
Lower extremity venous bilateral study completed.   Please see CV Proc for preliminary results.   Mikala Podoll, RDMS, RVT  

## 2020-11-27 NOTE — Progress Notes (Signed)
PROGRESS NOTE    [name]  NOB:[id number] DOB: September 28, 1918 DOA: 11/24/2020 PCP: Vernie Shanks, MD   Chief Complaint  Patient presents with   Code Sepsis    Brief Narrative:  87 year old male lives at home with his wife he has history of Parkinson's disease and prostate cancer history of prostatectomy CKD stage III [name] baseline creatinine is around 1.4 status post fall wife called EMS and was brought him to the hospital. Per wife at baseline they both walk 7 blocks daily.  She was concerned as patient was becoming more and more weak generalized weakness altered mental status increasing somnolence no fever chills cough nausea vomiting diarrhea.  She also noticed his oral intake was poor.   Assessment & Plan:   Principal Problem:   Severe sepsis ([place]) Active Problems:   DM2 (diabetes mellitus, type 2) (HCC)   HLD (hyperlipidemia)   Anemia   Parkinson disease (HCC)   Acute cystitis   Acute metabolic encephalopathy   Lactic acidosis   Acute kidney injury superimposed on CKD (HCC)   Generalized weakness   Acute hyponatremia  # Goals of care: pt's wife was asking to take him home today because she felt he may imminently die.  Had long conversation with [name], [name], and sister [name] (on the phone) with palliative as well.  Discussed with family and palliative care.  Will plan to see how he does over next 24 hours and have further discussions based on his progress at that time.  Given his impressive baseline (able to walk 7 blocks daily) and clear diagnosis and treatment plan at this time I think reasonable to see how he does, though I am concerned about his prognosis with his age/frailty.    #Enterobacter Asburiae Bacteremia likely 2/2 UTI  Sepsis due to enterobacter bacteremia  2/2 sets of cultures with enterobacter asburiae, resistant to cefazolin  Discussed abx with pharmacy, will continue cefepime (4th generation cephalosporins preferred due to concern for inducible amp C  resistance) 1/2 bottle with staph epi (suspect staph epi is contaminant) Continue cefepime (6/13 - present) Will repeat blood cultures  Follow urine cultures (consistent with enterobacter asburiae)  # acute metabolic encephalopathy secondary to sepsis/GNR bacteremia  COVID-negative flu negative. CT head no acute intracranial process. Delirium precautions  Per discussion with family, he seems to be not too far from his normal - he's responding appropriately to questions, but does seem to be sleeping most of the time i'm present ok with family staying night as needed to help with delirium    # atrial fibrillation with RVR  In the setting of fever and rigors 6/14 Follow echo TSH wnl CXR with right basilar atelectasis and small effusion Troponin mildly elevated - suspect demand Converted back to sinus  Hold off on anticoagulation for now with advanced age and thrombocytopenia  # Oxygen requiement Requiring 4 L by [place], but no clear etiology  CXR without focal consolidation Echo pending Discussed with RN, will try to aggressively wean O2 - if unable to wean, may need further workup  # elevated D dimer - LE [place] without DVT - follow echo - I suspect elevated d dimer is related to his sepsis/infection/inflammation - if unable to wean from oxygen, would have to at least consider w/u for PE, though would hold off with renal function at this time and and as noted above, suspect his primary issue is bacteremia   # Elevated Troponin No complaint of chest pain Repeat EKG today Troponin 63 ->  55  Suspect this is demand   # AKI on CKD stage IIIb due to dehydration decreased p.o. intake his admission creatinine is 2.13 his baseline creatinine is around 1.4. Continuing to gradually improve Follow with gentle IVF   # mild hyponatremia  Improved, trend, follow   # anemia likely multifactorial and chronic related to CKD  Iron Def Anemia  Hb relatively stable, follow  Follow B12 - labs c/w  iron def anemia Consider IV iron prior to discharge   # type 2 diabetes diet controlled check A1c A1c 5.2   # Parkinson's disease on levodopa and carbidopa    # hyperlipidemia on simvastatin   # thrombocytopenia his platelets were 124 on admission it is down to 104 today.  Likely from sepsis. Continue to follow    # Dysphagia Dysphagia 3. thin  Goals of care Palliative care c/s  DVT prophylaxis: SCD Code Status: (full Family Communication:wife at bedside Disposition:   Status is: Inpatient  Remains inpatient appropriate because:Inpatient level of care appropriate due to severity of illness  Dispo: The patient is from: Home              Anticipated d/c is to: Home              Patient currently is not medically stable to d/c.   Difficult to place patient No       Consultants:  Palliative care  Procedures:  none  Antimicrobials:  Anti-infectives (From admission, onward)    Start     Dose/Rate Route Frequency Ordered Stop   11/25/20 1700  ceFEPIme (MAXIPIME) 2 g in sodium chloride 0.9 % 100 mL IVPB        2 g 200 mL/hr over 30 Minutes Intravenous Every 24 hours 11/25/20 1125     11/25/20 0300  cefTRIAXone (ROCEPHIN) 1 g in sodium chloride 0.9 % 100 mL IVPB  Status:  Discontinued        1 g 200 mL/hr over 30 Minutes Intravenous Every 24 hours 11/24/20 2154 11/25/20 1125   11/24/20 1730  vancomycin (VANCOCIN) 1,500 mg in sodium chloride 0.9 % 500 mL IVPB        1,500 mg 250 mL/hr over 120 Minutes Intravenous  Once 11/24/20 1716 11/24/20 2209   11/24/20 1715  ceFEPIme (MAXIPIME) 2 g in sodium chloride 0.9 % 100 mL IVPB        2 g 200 mL/hr over 30 Minutes Intravenous  Once 11/24/20 1712 11/24/20 1817   11/24/20 1715  metroNIDAZOLE (FLAGYL) IVPB 500 mg        500 mg 100 mL/hr over 60 Minutes Intravenous  Once 11/24/20 1712 11/24/20 2127   11/24/20 1715  vancomycin (VANCOCIN) IVPB 1000 mg/200 mL premix  Status:  Discontinued        1,000 mg 200 mL/hr over 60  Minutes Intravenous  Once 11/24/20 1712 11/24/20 1716      Subjective: Wife again interpreter Son at bedside He denies CP, SOB.  Notes he feels so/so.  Says thank you when I explain why he's here and what we're doing for him   Objective: Vitals:   11/26/20 1355 11/26/20 2223 11/27/20 0602 11/27/20 0700  BP: (!) 163/61 (!) 178/69 (!) 171/75   Pulse: 61 65 64   Resp: 16 17 17    Temp: 99.5 F (37.5 C) 99.8 F (37.7 C) 97.9 F (36.6 C)   TempSrc: Oral Oral Oral   SpO2: 100% 100%    Weight:    66.8  kg  Height:        Intake/Output Summary (Last 24 hours) at 11/27/2020 1415 Last data filed at 11/27/2020 0653 Gross per 24 hour  Intake 100 ml  Output 800 ml  Net -700 ml   Filed Weights   11/25/20 1808 11/26/20 0500 11/27/20 0700  Weight: 68.2 kg 68.1 kg 66.8 kg    Examination:  General: No acute distress. Cardiovascular: RRR Lungs: CTAB, diminished Abdomen: Soft, nontender, nondistended  Neurological: Sleeps during most of my discussion with family - awakens and responds appropriately when I speak to him with wife's assistance. Moves all extremities 4 . Cranial nerves II through XII grossly intact. Skin: Warm and dry. No rashes or lesions. Extremities: No clubbing or cyanosis. No edema.    Data Reviewed: I have personally reviewed following labs and imaging studies  CBC: Recent Labs  Lab 11/24/20 1704 11/25/20 0230 11/25/20 0454 11/25/20 1138 11/25/20 1457 11/27/20 0537  WBC 11.4*  --  10.1  --  15.0* 6.2  NEUTROABS 10.3*  --  9.1*  --   --  5.5  HGB 8.6* 7.9* 8.1* 9.1* 10.2* 8.0*  HCT 25.5* 23.9* 24.8* 27.4* 30.8* 24.3*  MCV 99.2  --  99.6  --  99.4 100.4*  PLT 124*  --  104*  --  128* 111*    Basic Metabolic Panel: Recent Labs  Lab 11/24/20 1704 11/25/20 0454 11/25/20 1457 11/26/20 2034 11/27/20 0537  NA 133* 134* 135  --  139  K 4.5 4.0 4.5  --  3.6  CL 102 102 104  --  109  CO2 25 24 20*  --  22  GLUCOSE 111* 97 80  --  127*  BUN 37* 39* 42*   --  61*  CREATININE 2.13* 1.80* 1.86*  --  1.68*  CALCIUM 8.0* 7.7* 8.0*  --  7.8*  MG 2.1 2.0  --   --  2.3  PHOS  --   --   --  3.8  --     GFR: Estimated Creatinine Clearance: 21.6 mL/min (Dyshon Philbin) (by C-G formula based on SCr of 1.68 mg/dL (H)).  Liver Function Tests: Recent Labs  Lab 11/24/20 1704 11/25/20 0454 11/25/20 1457 11/27/20 0537  AST 36 40 46* 22  ALT 5 8 14 18   ALKPHOS 57 48 61 52  BILITOT 1.1 0.9 1.0 0.6  PROT 6.3* 5.7* 6.5 5.7*  ALBUMIN 3.2* 2.6* 2.8* 2.3*    CBG: Recent Labs  Lab 11/26/20 1140 11/26/20 1755 11/27/20 0000 11/27/20 0559 11/27/20 1157  GLUCAP 123* 123* 144* 128* 138*     Recent Results (from the past 240 hour(s))  Culture, blood (Routine x 2)     Status: Abnormal   Collection Time: 11/24/20  5:04 PM   Specimen: BLOOD RIGHT ARM  Result Value Ref Range Status   Specimen Description   Final    BLOOD RIGHT ARM Performed at [facility], [place] [address], [place], [place] [address]    Special Requests   Final    BOTTLES DRAWN AEROBIC AND ANAEROBIC Blood Culture adequate volume Performed at [facility] [place] [address] [place],  [address]    Culture  Setup Time   Final    GRAM NEGATIVE RODS IN BOTH AEROBIC AND ANAEROBIC BOTTLES Organism ID to follow CRITICAL RESULT CALLED TO, READ BACK BY AND VERIFIED WITH: [place] AT 9163 ON 11/25/20 [place]    Culture (Natalio Salois)  Final    ENTEROBACTER  ASBURIAE SUSCEPTIBILITIES PERFORMED ON PREVIOUS CULTURE WITHIN THE LAST 5 DAYS. Performed at [facility], [place] [address] [place], [place] [address]    Report Status 11/27/2020 FINAL  Final  Blood Culture ID Panel (Reflexed)     Status: Abnormal   Collection Time: 11/24/20  5:04 PM  Result Value Ref Range Status   Enterococcus faecalis NOT DETECTED NOT DETECTED Final   Enterococcus Faecium NOT DETECTED NOT DETECTED Final   Listeria monocytogenes NOT DETECTED NOT DETECTED Final   Staphylococcus  species NOT DETECTED NOT DETECTED Final   Staphylococcus aureus (BCID) NOT DETECTED NOT DETECTED Final   Staphylococcus epidermidis NOT DETECTED NOT DETECTED Final   Staphylococcus lugdunensis NOT DETECTED NOT DETECTED Final   Streptococcus species NOT DETECTED NOT DETECTED Final   Streptococcus agalactiae NOT DETECTED NOT DETECTED Final   Streptococcus pneumoniae NOT DETECTED NOT DETECTED Final   Streptococcus pyogenes NOT DETECTED NOT DETECTED Final   Malone Admire.calcoaceticus-baumannii NOT DETECTED NOT DETECTED Final   Bacteroides fragilis NOT DETECTED NOT DETECTED Final   Enterobacterales DETECTED (Stephinie Battisti) NOT DETECTED Final    Comment: Enterobacterales represent [place] Scheiderer large order of gram negative bacteria, not Tarrie Mcmichen single organism. CRITICAL RESULT CALLED TO, READ BACK BY AND VERIFIED WITH: [place] AT 0932 ON 11/25/20 BY KJ    Enterobacter cloacae complex DETECTED (Gautham Hewins) NOT DETECTED Final    Comment: CRITICAL RESULT CALLED TO, READ BACK BY AND VERIFIED WITH: [place] AT 1117 ON 11/25/20 BY KJ    Escherichia coli NOT DETECTED NOT DETECTED Final   Klebsiella aerogenes NOT DETECTED NOT DETECTED Final   Klebsiella oxytoca NOT DETECTED NOT DETECTED Final   Klebsiella pneumoniae NOT DETECTED NOT DETECTED Final   Proteus species NOT DETECTED NOT DETECTED Final   Salmonella species NOT DETECTED NOT DETECTED Final   Serratia marcescens NOT DETECTED NOT DETECTED Final   Haemophilus influenzae NOT DETECTED NOT DETECTED Final   Neisseria meningitidis NOT DETECTED NOT DETECTED Final   Pseudomonas aeruginosa NOT DETECTED NOT DETECTED Final   Stenotrophomonas maltophilia NOT DETECTED NOT DETECTED Final   Candida albicans NOT DETECTED NOT DETECTED Final   Candida auris NOT DETECTED NOT DETECTED Final   Candida glabrata NOT DETECTED NOT DETECTED Final   Candida krusei NOT DETECTED NOT DETECTED Final   Candida parapsilosis NOT DETECTED NOT DETECTED Final   Candida tropicalis NOT DETECTED  NOT DETECTED Final   Cryptococcus neoformans/gattii NOT DETECTED NOT DETECTED Final   CTX-M ESBL NOT DETECTED NOT DETECTED Final   Carbapenem resistance IMP NOT DETECTED NOT DETECTED Final   Carbapenem resistance KPC NOT DETECTED NOT DETECTED Final   Carbapenem resistance NDM NOT DETECTED NOT DETECTED Final   Carbapenem resist OXA 48 LIKE NOT DETECTED NOT DETECTED Final   Carbapenem resistance VIM NOT DETECTED NOT DETECTED Final    Comment: Performed at [facility], [address] [name], [place] [address]  Culture, blood (Routine x 2)     Status: Abnormal   Collection Time: 11/24/20  5:09 PM   Specimen: BLOOD LEFT ARM  Result Value Ref Range Status   Specimen Description   Final    BLOOD LEFT ARM Performed at [facility], [place] [address] [place], [place] [address]    Special Requests   Final    BOTTLES DRAWN AEROBIC AND ANAEROBIC Blood Culture adequate volume Performed at [place] [address], [place], [place] [address]    Culture  Setup Time   Final  GRAM POSITIVE COCCI GRAM NEGATIVE RODS Organism ID to follow IN BOTH AEROBIC AND ANAEROBIC BOTTLES CRITICAL RESULT CALLED TO, READ BACK BY AND VERIFIED WITH: PHARMD Halen Mossbarger.PHAN AT 1239 ON 11/25/2020 BY T.SAAD.    Culture (Natassja Ollis)  Final    ENTEROBACTER ASBURIAE STAPHYLOCOCCUS EPIDERMIDIS THE SIGNIFICANCE OF ISOLATING THIS ORGANISM FROM Andrell Bergeson SINGLE SET OF BLOOD CULTURES WHEN MULTIPLE SETS ARE DRAWN IS UNCERTAIN. PLEASE NOTIFY THE MICROBIOLOGY DEPARTMENT WITHIN ONE WEEK IF SPECIATION AND SENSITIVITIES ARE REQUIRED. Performed at [facility], [place] [address], [place], [place] [address]    Report Status 11/27/2020 FINAL  Final   Organism ID, Bacteria ENTEROBACTER ASBURIAE  Final      Susceptibility   Enterobacter asburiae - MIC*    CEFAZOLIN >=64 RESISTANT Resistant     CEFEPIME <=0.12 SENSITIVE Sensitive     CEFTAZIDIME <=1 SENSITIVE Sensitive     CEFTRIAXONE <=0.25 SENSITIVE Sensitive      CIPROFLOXACIN <=0.25 SENSITIVE Sensitive     GENTAMICIN <=1 SENSITIVE Sensitive     IMIPENEM <=0.25 SENSITIVE Sensitive     TRIMETH/SULFA <=20 SENSITIVE Sensitive     PIP/TAZO <=4 SENSITIVE Sensitive     * ENTEROBACTER ASBURIAE  Blood Culture ID Panel (Reflexed)     Status: Abnormal   Collection Time: 11/24/20  5:09 PM  Result Value Ref Range Status   Enterococcus faecalis NOT DETECTED NOT DETECTED Final   Enterococcus Faecium NOT DETECTED NOT DETECTED Final   Listeria monocytogenes NOT DETECTED NOT DETECTED Final   Staphylococcus species DETECTED (Jaxzen Vanhorn) NOT DETECTED Final    Comment: CRITICAL RESULT CALLED TO, READ BACK BY AND VERIFIED WITH: PHARMD Keyontay Stolz.PHAN AT 1239 ON 11/25/2020 BY T.SAAD.    Staphylococcus aureus (BCID) NOT DETECTED NOT DETECTED Final   Staphylococcus epidermidis DETECTED (Danity Schmelzer) NOT DETECTED Final    Comment: Methicillin (oxacillin) resistant coagulase negative staphylococcus. Possible blood culture contaminant (unless isolated from more than one blood culture draw or clinical case suggests pathogenicity). No antibiotic treatment is indicated for blood  culture contaminants. CRITICAL RESULT CALLED TO, READ BACK BY AND VERIFIED WITH: PHARMD Mattye Verdone.PHAN AT 1239 ON 11/25/2020 BY T.SAAD.    Staphylococcus lugdunensis NOT DETECTED NOT DETECTED Final   Streptococcus species NOT DETECTED NOT DETECTED Final   Streptococcus agalactiae NOT DETECTED NOT DETECTED Final   Streptococcus pneumoniae NOT DETECTED NOT DETECTED Final   Streptococcus pyogenes NOT DETECTED NOT DETECTED Final   Armend Hochstatter.calcoaceticus-baumannii NOT DETECTED NOT DETECTED Final   Bacteroides fragilis NOT DETECTED NOT DETECTED Final   Enterobacterales DETECTED (Leiann Sporer) NOT DETECTED Final    Comment: Enterobacterales represent Akshaj Besancon large order of gram negative bacteria, not Aijalon Kirtz single organism. CRITICAL RESULT CALLED TO, READ BACK BY AND VERIFIED WITH: PHARMD Pauleen Goleman.PHAN AT 1239 ON 11/25/2020 BY T.SAAD.    Enterobacter cloacae complex  DETECTED (Callee Rohrig) NOT DETECTED Final    Comment: CRITICAL RESULT CALLED TO, READ BACK BY AND VERIFIED WITH: PHARMD Aisea Bouldin.PHAN AT 1239 ON 11/25/2020 BY T.SAAD.    Escherichia coli NOT DETECTED NOT DETECTED Final   Klebsiella aerogenes NOT DETECTED NOT DETECTED Final   Klebsiella oxytoca NOT DETECTED NOT DETECTED Final   Klebsiella pneumoniae NOT DETECTED NOT DETECTED Final   Proteus species NOT DETECTED NOT DETECTED Final   Salmonella species NOT DETECTED NOT DETECTED Final   Serratia marcescens NOT DETECTED NOT DETECTED Final   Haemophilus influenzae NOT DETECTED NOT DETECTED Final   Neisseria meningitidis NOT DETECTED NOT DETECTED Final   Pseudomonas aeruginosa NOT DETECTED NOT DETECTED Final   Stenotrophomonas maltophilia NOT  DETECTED NOT DETECTED Final   Candida albicans NOT DETECTED NOT DETECTED Final   Candida auris NOT DETECTED NOT DETECTED Final   Candida glabrata NOT DETECTED NOT DETECTED Final   Candida krusei NOT DETECTED NOT DETECTED Final   Candida parapsilosis NOT DETECTED NOT DETECTED Final   Candida tropicalis NOT DETECTED NOT DETECTED Final   Cryptococcus neoformans/gattii NOT DETECTED NOT DETECTED Final   CTX-M ESBL NOT DETECTED NOT DETECTED Final   Carbapenem resistance IMP NOT DETECTED NOT DETECTED Final   Carbapenem resistance KPC NOT DETECTED NOT DETECTED Final   Methicillin resistance mecA/C DETECTED (Angie Piercey) NOT DETECTED Final    Comment: CRITICAL RESULT CALLED TO, READ BACK BY AND VERIFIED WITH: PHARMD Hallelujah Wysong.PHAN AT 1239 ON 11/25/2020 BY T.SAAD.    Carbapenem resistance NDM NOT DETECTED NOT DETECTED Final   Carbapenem resist OXA 48 LIKE NOT DETECTED NOT DETECTED Final   Carbapenem resistance VIM NOT DETECTED NOT DETECTED Final    Comment: Performed at [facility], [place] [address] [place], [place] [address]  Resp Panel by RT-PCR (Flu Jalyah Weinheimer&B, Covid) Nasopharyngeal Swab     Status: None   Collection Time: 11/24/20  5:12 PM   Specimen: Nasopharyngeal Swab; Nasopharyngeal(NP)  swabs in vial transport medium  Result Value Ref Range Status   SARS Coronavirus 2 by RT PCR NEGATIVE NEGATIVE Final    Comment: (NOTE) SARS-CoV-2 target nucleic acids are NOT DETECTED.  The SARS-CoV-2 RNA is generally detectable in upper respiratory specimens during the acute phase of infection. The lowest concentration of SARS-CoV-2 viral copies this assay can detect is 138 copies/mL. Jerremy Maione negative result does not preclude SARS-Cov-2 infection and should not be used as the sole basis for treatment or other patient management decisions. Yeny Schmoll negative result may occur with  improper specimen collection/handling, submission of specimen other than nasopharyngeal swab, presence of viral mutation(s) within the areas targeted by this assay, and inadequate number of viral copies(<138 copies/mL). Stefanny Pieri negative result must be combined with clinical observations, patient history, and epidemiological information. The expected result is Negative.  Fact Sheet for Patients:  EntrepreneurPulse.com.au  Fact Sheet for Healthcare Providers:  IncredibleEmployment.be  This test is no t yet approved or cleared by the [place] FDA and  has been authorized for detection and/or diagnosis of SARS-CoV-2 by FDA under an Emergency Use Authorization (EUA). This EUA will remain  in effect (meaning this test can be used) for the duration of the COVID-19 declaration under Section 564(b)(1) of the Act, 21 U.S.C.section 360bbb-3(b)(1), unless the authorization is terminated  or revoked sooner.       Influenza Katelyn Broadnax by PCR NEGATIVE NEGATIVE Final   Influenza B by PCR NEGATIVE NEGATIVE Final    Comment: (NOTE) The Xpert Xpress SARS-CoV-2/FLU/RSV plus assay is intended as an aid in the diagnosis of influenza from Nasopharyngeal swab specimens and should not be used as Darianne Muralles sole basis for treatment. Nasal washings and aspirates are unacceptable for Xpert Xpress  SARS-CoV-2/FLU/RSV testing.  Fact Sheet for Patients: EntrepreneurPulse.com.au  Fact Sheet for Healthcare Providers: IncredibleEmployment.be  This test is not yet approved or cleared by the [place] FDA and has been authorized for detection and/or diagnosis of SARS-CoV-2 by FDA under an Emergency Use Authorization (EUA). This EUA will remain in effect (meaning this test can be used) for the duration of the COVID-19 declaration under Section 564(b)(1) of the Act, 21 U.S.C. section 360bbb-3(b)(1), unless the authorization is terminated or revoked.  Performed at [facility], [place] [address]  [address] [place], [place] [address]   Urine culture     Status: Abnormal   Collection Time: 11/24/20  8:30 PM   Specimen: In/Out Cath Urine  Result Value Ref Range Status   Specimen Description   Final    IN/OUT CATH URINE Performed at [place] [address] [place], [place] [address]    Special Requests   Final    NONE Performed at [facility], [place] [address] [place], [place] [address]    Culture 60,000 COLONIES/mL ENTEROBACTER SPECIES (Ahnika Hannibal)  Final   Report Status 11/27/2020 FINAL  Final   Organism ID, Bacteria ENTEROBACTER SPECIES (Novaleigh Kohlman)  Final      Susceptibility   Enterobacter species - MIC*    CEFAZOLIN >=64 RESISTANT Resistant     CEFEPIME <=0.12 SENSITIVE Sensitive     CEFTRIAXONE <=0.25 SENSITIVE Sensitive     CIPROFLOXACIN <=0.25 SENSITIVE Sensitive     GENTAMICIN <=1 SENSITIVE Sensitive     IMIPENEM <=0.25 SENSITIVE Sensitive     NITROFURANTOIN <=16 SENSITIVE Sensitive     TRIMETH/SULFA <=20 SENSITIVE Sensitive     PIP/TAZO <=4 SENSITIVE Sensitive     * 60,000 COLONIES/mL ENTEROBACTER SPECIES         Radiology Studies: DG Chest 1 View  Result Date: 11/25/2020 CLINICAL DATA:  Increasing weakness EXAM: CHEST  1 VIEW COMPARISON:  11/24/2020 FINDINGS: Cardiac shadow is stable.  Aortic calcifications are seen. Right basilar atelectasis is noted with mild blunting of the right costophrenic angle. Left lung is clear. No bony abnormality is seen. IMPRESSION: Right basilar atelectasis and small effusion. Electronically Signed   By: Inez Catalina M.D.   On: 11/25/2020 15:31   DG CHEST PORT 1 VIEW  Result Date: 11/26/2020 CLINICAL DATA:  87 year old male with hypoxia. EXAM: PORTABLE CHEST 1 VIEW COMPARISON:  Chest radiograph dated 11/25/2020 FINDINGS: Minimal right lung base atelectasis. Trace right pleural effusion may be present. No focal consolidation or pneumothorax. Mild cardiomegaly. Atherosclerotic calcification of the aorta. No acute osseous pathology. IMPRESSION: 1. No focal consolidation. 2. Mild cardiomegaly. Electronically Signed   By: Anner Crete M.D.   On: 11/26/2020 20:40   DG Swallowing Func-Speech Pathology  Result Date: 11/26/2020 Formatting of this result is different from the original. Objective Swallowing Evaluation: Type of Study: MBS-Modified Barium Swallow Study  Patient Details Name: Monish Haliburton MRN: [id number] Date of Birth: Aug 17, 1918 Today's Date: 11/26/2020 Time: SLP Start Time (ACUTE ONLY): 1233 -SLP Stop Time (ACUTE ONLY): 1300 SLP Time Calculation (min) (ACUTE ONLY): 27 min Past Medical History: Past Medical History: Diagnosis Date  Allergy   rhinitis  Diabetes mellitus   dx 3-11, A1C 6.7  Gout   Hiatal hernia   Hyperlipidemia   Parkinson disease ([place]) 10/23/2015  Prostate CA ([place]) 2002  s/p  x-ray therapy and surgery  Skin cancer   Trigeminal neuralgia   h/o , status post gamma knife procedure and trial with multiple drugs Past Surgical History: Past Surgical History: Procedure Laterality Date  CHOLECYSTECTOMY   HPI: Mr Lahaie is Uri Covey 87 yo male adm to [facility] with AMS, UTI after 2 days of somnolence and confusion at home.  CXR showed right basilar ATX and effusion.  Pt also with h/o prostate cancer, large hiatal hernia repair, distal esophageal diverticulum.  He  resides with his wife at home.  Swallow eval ordered. Wife and pt deny pt having h/o dysphagia, sensing reflux or coughing with intake.  Wife does report he lost weight prior to admission.  Plan for palliative meeting todoay. Rapid response called yesterday due to pt having rigors, tachypnea, and low grade fever.  Subjective: pt awake in bed, wife present and desired mbs Assessment / Plan / Recommendation CHL IP CLINICAL IMPRESSIONS 11/26/2020 Clinical Impression Mild oropharyngoesophageal dysphagia noted with trace aspiration of thin due to decreased oral control allowing spillage into larynx prior to swallow.  Aspiration with thin was just below vocal cords, cleared with cued cough and was prevented with small single sips of liquids.  Mildly prolonged mastication and oral transiting of cracker noted.   Pharyngeal swallow is strong without pharyngeal retention of solids including pudding and cracker.  Prominent CP/cricopharyngeal bar with suspected developing diverticulum likley due to decreased UES relaxation noted.  Thus cervical esophageal backflow of thin observed without pt sensation.  He did not aspirate mild pyriform sinus//CP backflowed material but certainly is at risk.  Upon esophageal sweep, pt appeared with esophageal diverticulum (noted in his medical record) and suspected mild motility issues - but radiologist not present to confirm and this test does not assess esophageal phase of swallow.  Suspect pt's primary symptoms are due to his cervical esophageal dysphagia and reviewed compensation strategies with his wife using video and teach back.  Advised pt consume drinks that are slightly thicker with his meals *eg V8, OJ, coffee with cream, Ensure, etc if increased comfort, decreased coughing with intake.  Recommend thin drinks (eg water) between meals for hydration.  Pt will benefit from staying upright after meals (wife reports he lays down at home after eating) and consuming frequent small meals as  able.  Crushing medications may help to decr risk of whole lodging at cricopharyngeal region and potentially backflowing/aspirated.  Will follow up for dysphagia management/education.  Pt's swallow ability instrumentally was much better than presented clinically.  He did NOT cough reflexively during his MBS. SLP Visit Diagnosis Dysphagia, pharyngoesophageal phase (R13.14);Dysphagia, oropharyngeal phase (R13.12) Attention and concentration deficit following -- Frontal lobe and executive function deficit following -- Impact on safety and function Moderate aspiration risk   CHL IP TREATMENT RECOMMENDATION 11/26/2020 Treatment Recommendations Therapy as outlined in treatment plan below   Prognosis 11/26/2020 Prognosis for Safe Diet Advancement Good Barriers to Reach Goals Time post onset Barriers/Prognosis Comment -- CHL IP DIET RECOMMENDATION 11/26/2020 SLP Diet Recommendations SLP Diet Recommendations: Dysphagia 3 (Mech soft) solids;Thin liquid - slightly thicker drinks with meals if prevents coughing, thin water between meals for hydration  Liquid Administration via: Cup;Straw  Medication Administration: Crushed with puree  Supervision: Patient able to self feed  Compensations: Slow rate;Small sips/bites  Postural Changes: Remain semi-upright after after feeds/meals (Comment);Seated upright at 90 degrees; small frequent meals  Oral Care Recommendations: Oral care BID Liquid Administration via  Medication Administration  Compensations  Postural Changes    CHL IP OTHER RECOMMENDATIONS 11/26/2020 Recommended Consults -- Oral Care Recommendations Oral care BID Other Recommendations --   CHL IP FOLLOW UP RECOMMENDATIONS 11/26/2020 Follow up Recommendations None   CHL IP FREQUENCY AND DURATION 11/26/2020 Speech Therapy Frequency (ACUTE ONLY) min 1 x/week Treatment Duration 1 week      CHL IP ORAL PHASE 11/26/2020 Oral Phase Impaired Oral - Pudding Teaspoon -- Oral - Pudding Cup -- Oral - Honey Teaspoon -- Oral - Honey Cup -- Oral  - Nectar Teaspoon WFL;Decreased bolus cohesion;Premature spillage Oral - Nectar Cup [facility];Decreased bolus cohesion;Premature spillage Oral - Nectar Straw -- Oral - Thin Teaspoon WFL;Premature spillage;Decreased bolus cohesion Oral - Thin Cup [facility];Decreased bolus cohesion;Premature spillage Oral -  Thin Straw WFL;Premature spillage;Decreased bolus cohesion Oral - Puree WFL Oral - Mech Soft Delayed oral transit;Impaired mastication;Lingual pumping Oral - Regular -- Oral - Multi-Consistency -- Oral - Pill -- Oral Phase - Comment --  CHL IP PHARYNGEAL PHASE 11/26/2020 Pharyngeal Phase Impaired Pharyngeal- Pudding Teaspoon -- Pharyngeal -- Pharyngeal- Pudding Cup -- Pharyngeal -- Pharyngeal- Honey Teaspoon -- Pharyngeal -- Pharyngeal- Honey Cup -- Pharyngeal -- Pharyngeal- Nectar Teaspoon WFL Pharyngeal Material does not enter airway Pharyngeal- Nectar Cup [facility];Pharyngeal residue - cp segment Pharyngeal Material does not enter airway Pharyngeal- Nectar Straw Pharyngeal residue - cp segment;Delayed swallow initiation-vallecula Pharyngeal Material does not enter airway Pharyngeal- Thin Teaspoon WFL;Delayed swallow initiation-pyriform sinuses;Pharyngeal residue - cp segment Pharyngeal Material does not enter airway Pharyngeal- Thin Cup Delayed swallow initiation-pyriform sinuses;Reduced laryngeal elevation;Reduced airway/laryngeal closure;Penetration/Aspiration during swallow;Pharyngeal residue - cp segment Pharyngeal Material enters airway, remains ABOVE vocal cords then ejected out Pharyngeal- Thin Straw Delayed swallow initiation-pyriform sinuses;Reduced airway/laryngeal closure;Reduced laryngeal elevation;Penetration/Aspiration before swallow;Trace aspiration;Pharyngeal residue - cp segment Pharyngeal -- Pharyngeal- Puree WFL Pharyngeal Material does not enter airway Pharyngeal- Mechanical Soft WFL Pharyngeal Material does not enter airway Pharyngeal- Regular -- Pharyngeal -- Pharyngeal- Multi-consistency -- Pharyngeal --  Pharyngeal- Pill -- Pharyngeal -- Pharyngeal Comment use of cup prevents trace amount of aspiration, disoordinated swallow with thin allowed liquids to spill into larynx prior to swallow trigger - very trace amount of aspiration of thin that cleared  CHL IP CERVICAL ESOPHAGEAL PHASE 11/26/2020 Cervical Esophageal Phase Impaired Pudding Teaspoon -- Pudding Cup -- Honey Teaspoon -- Honey Cup -- Nectar Teaspoon -- Nectar Cup Reduced cricopharyngeal relaxation;Prominent cricopharyngeal segment Nectar Straw Reduced cricopharyngeal relaxation;Prominent cricopharyngeal segment Thin Teaspoon Reduced cricopharyngeal relaxation;Prominent cricopharyngeal segment Thin Cup Prominent cricopharyngeal segment;Reduced cricopharyngeal relaxation;Esophageal backflow into cervical esophagus Thin Straw Reduced cricopharyngeal relaxation;Prominent cricopharyngeal segment;Esophageal backflow into cervical esophagus Puree Reduced cricopharyngeal relaxation Mechanical Soft Reduced cricopharyngeal relaxation Regular -- Multi-consistency -- Pill -- Cervical Esophageal Comment -- Macario Golds 11/26/2020, 2:15 PM              VAS [place] LOWER EXTREMITY VENOUS (DVT)  Result Date: 11/27/2020  Lower Venous DVT Study Patient Name:  LAVONNE CASS  Date of Exam:   11/27/2020 Medical Rec #: [id number]     Accession #:    [id number] Date of Birth: [birth year]     Patient Gender: M Patient Age:   89Y Exam Location:  [facility] Procedure:      VAS [place] LOWER EXTREMITY VENOUS (DVT) Referring Phys: [id number] Amar Sippel CALDWELL POWELL JR --------------------------------------------------------------------------------  Indications: Elevated d-dimer.  Comparison Study: 04-18-2020 Right lower extremity venous was negative for DVT. Performing Technologist: Darlin Coco RDMS,RVT  Examination Guidelines: Layton Tappan complete evaluation includes B-mode imaging, spectral Doppler, color Doppler, and power Doppler as needed of all accessible portions of each vessel. Bilateral  testing is considered an integral part of Jibril Mcminn complete examination. Limited examinations for reoccurring indications may be performed as noted. The reflux portion of the exam is performed with the patient in reverse Trendelenburg.  +---------+---------------+---------+-----------+----------+--------------+ RIGHT    CompressibilityPhasicitySpontaneityPropertiesThrombus Aging +---------+---------------+---------+-----------+----------+--------------+ CFV      Full           Yes      Yes                                 +---------+---------------+---------+-----------+----------+--------------+ SFJ      Full                                                        +---------+---------------+---------+-----------+----------+--------------+  FV Prox  Full                                                        +---------+---------------+---------+-----------+----------+--------------+ FV Mid   Full                                                        +---------+---------------+---------+-----------+----------+--------------+ FV DistalFull                                                        +---------+---------------+---------+-----------+----------+--------------+ PFV      Full                                                        +---------+---------------+---------+-----------+----------+--------------+ POP      Full           Yes      Yes                                 +---------+---------------+---------+-----------+----------+--------------+ PTV      Full                                                        +---------+---------------+---------+-----------+----------+--------------+ PERO     Full                                                        +---------+---------------+---------+-----------+----------+--------------+   +---------+---------------+---------+-----------+----------+--------------+ LEFT      CompressibilityPhasicitySpontaneityPropertiesThrombus Aging +---------+---------------+---------+-----------+----------+--------------+ CFV      Full           Yes      Yes                                 +---------+---------------+---------+-----------+----------+--------------+ SFJ      Full                                                        +---------+---------------+---------+-----------+----------+--------------+ FV Prox  Full                                                        +---------+---------------+---------+-----------+----------+--------------+  FV Mid   Full                                                        +---------+---------------+---------+-----------+----------+--------------+ FV DistalFull                                                        +---------+---------------+---------+-----------+----------+--------------+ PFV      Full                                                        +---------+---------------+---------+-----------+----------+--------------+ POP      Full           Yes      Yes                                 +---------+---------------+---------+-----------+----------+--------------+ PTV      Full                                                        +---------+---------------+---------+-----------+----------+--------------+ PERO     Full                                                        +---------+---------------+---------+-----------+----------+--------------+     Summary: RIGHT: - There is no evidence of deep vein thrombosis in the lower extremity.  - No cystic structure found in the popliteal fossa.  LEFT: - There is no evidence of deep vein thrombosis in the lower extremity.  - No cystic structure found in the popliteal fossa.  *See table(s) above for measurements and observations. Electronically signed by Jamelle Haring on 11/27/2020 at 2:10:18 PM.    Final         Scheduled Meds:   Carbidopa-Levodopa ER  1.5 tablet Oral TID   feeding supplement  237 mL Oral BID BM   insulin aspart  0-6 Units Subcutaneous Q6H   Continuous Infusions:  ceFEPime (MAXIPIME) IV Stopped (11/26/20 1728)     LOS: 3 days    Time spent: over 30 min    Fayrene Helper, MD [facility]   To contact the attending provider between 7A-7P or the covering provider during after hours 7P-7A, please log into the web site [email or web address] and access using universal [place] password for that web site. If you do not have the password, please call the hospital operator.  11/27/2020, 2:15 PM

## 2020-11-27 NOTE — Progress Notes (Signed)
Initial Nutrition Assessment  INTERVENTION:   -Ensure Enlive po BID, each supplement provides 350 kcal and 20 grams of protein   -Magic cup BID with meals, each supplement provides 290 kcal and 9 grams of protein   NUTRITION DIAGNOSIS:   Inadequate oral intake related to lethargy/confusion as evidenced by per patient/family report.  GOAL:   Patient will meet greater than or equal to 90% of their needs  MONITOR:   PO intake, Supplement acceptance, Labs, Weight trends, I & O's  REASON FOR ASSESSMENT:   Malnutrition Screening Tool    ASSESSMENT:   87 year old male lives at home with his wife he has history of Parkinson's disease and prostate cancer history of prostatectomy CKD stage III a baseline creatinine is around 1.4 status post fall wife called EMS and was brought him to the hospital.  Patient having [place] meeting today.  Per chart review, pt has been eating poorly d/t increased AMS for 2 days PTA. Currently alert/oriented x 1.  Consumed 10% of breakfast this morning. SLP evaluated 6/15, s/p MBS. Recommended dysphagia 3 diet with slightly thickened liquids.  Will order Ensure supplements and Magic cups with meals.  Per weight records, pt weighed 140 lbs on 4/19. Current weight: 147 lbs.  Per nursing documentation, pt with moderate generalized edema.  Medications reviewed.  Labs reviewed:  CBGs: 128-144  NUTRITION - FOCUSED PHYSICAL EXAM:  Unable to complete  Diet Order:   Diet Order             DIET DYS 3 Room service appropriate? Yes; Fluid consistency: Thin  Diet effective now                   EDUCATION NEEDS:   No education needs have been identified at this time  Skin:  Skin Assessment: Reviewed RN Assessment  Last BM:  6/14  Height:   Ht Readings from Last 1 Encounters:  11/24/20 5\' 6"  (1.676 m)    Weight:   Wt Readings from Last 1 Encounters:  11/27/20 66.8 kg    BMI:  Body mass index is 23.77 kg/m.  Estimated Nutritional  Needs:   Kcal:  1600-1800  Protein:  75-85g  Fluid:  1.6L/day   Clayton Bibles, MS, RD, LDN Inpatient Clinical Dietitian Contact information available via Amion

## 2020-11-27 NOTE — Progress Notes (Signed)
New onset Afib

## 2020-11-28 ENCOUNTER — Inpatient Hospital Stay (HOSPITAL_COMMUNITY): Payer: Medicare PPO

## 2020-11-28 DIAGNOSIS — Z7189 Other specified counseling: Secondary | ICD-10-CM | POA: Diagnosis not present

## 2020-11-28 DIAGNOSIS — Z515 Encounter for palliative care: Secondary | ICD-10-CM | POA: Diagnosis not present

## 2020-11-28 DIAGNOSIS — R9431 Abnormal electrocardiogram [ECG] [EKG]: Secondary | ICD-10-CM | POA: Diagnosis not present

## 2020-11-28 DIAGNOSIS — N3001 Acute cystitis with hematuria: Secondary | ICD-10-CM | POA: Diagnosis not present

## 2020-11-28 DIAGNOSIS — A419 Sepsis, unspecified organism: Secondary | ICD-10-CM | POA: Diagnosis not present

## 2020-11-28 LAB — URINALYSIS, ROUTINE W REFLEX MICROSCOPIC
Bilirubin Urine: NEGATIVE
Glucose, UA: NEGATIVE mg/dL
Ketones, ur: NEGATIVE mg/dL
Nitrite: NEGATIVE
Protein, ur: 100 mg/dL — AB
RBC / HPF: >50 RBC/hpf — ABNORMAL HIGH (ref 0–5)
Specific Gravity, Urine: 1.016 (ref 1.005–1.030)
WBC, UA: >50 WBC/hpf — ABNORMAL HIGH (ref 0–5)
pH: 6 (ref 5.0–8.0)

## 2020-11-28 LAB — COMPREHENSIVE METABOLIC PANEL
ALT: <5 U/L (ref 0–44)
AST: 25 U/L (ref 15–41)
Albumin: 2 g/dL — ABNORMAL LOW (ref 3.5–5.0)
Alkaline Phosphatase: 50 U/L (ref 38–126)
Anion gap: 7 (ref 5–15)
BUN: 55 mg/dL — ABNORMAL HIGH (ref 8–23)
CO2: 24 mmol/L (ref 22–32)
Calcium: 7.8 mg/dL — ABNORMAL LOW (ref 8.9–10.3)
Chloride: 111 mmol/L (ref 98–111)
Creatinine, Ser: 1.66 mg/dL — ABNORMAL HIGH (ref 0.61–1.24)
GFR, Estimated: 37 mL/min — ABNORMAL LOW (ref >60)
Glucose, Bld: 138 mg/dL — ABNORMAL HIGH (ref 70–99)
Potassium: 3.6 mmol/L (ref 3.5–5.1)
Sodium: 142 mmol/L (ref 135–145)
Total Bilirubin: 0.3 mg/dL (ref 0.3–1.2)
Total Protein: 5.4 g/dL — ABNORMAL LOW (ref 6.5–8.1)

## 2020-11-28 LAB — CBC WITH DIFFERENTIAL/PLATELET
Abs Immature Granulocytes: 0.04 10*3/uL (ref 0.00–0.07)
Basophils Absolute: 0 10*3/uL (ref 0.0–0.1)
Basophils Relative: 0 %
Eosinophils Absolute: 0.1 10*3/uL (ref 0.0–0.5)
Eosinophils Relative: 2 %
HCT: 24.4 % — ABNORMAL LOW (ref 39.0–52.0)
Hemoglobin: 7.9 g/dL — ABNORMAL LOW (ref 13.0–17.0)
Immature Granulocytes: 1 %
Lymphocytes Relative: 7 %
Lymphs Abs: 0.5 10*3/uL — ABNORMAL LOW (ref 0.7–4.0)
MCH: 32.6 pg (ref 26.0–34.0)
MCHC: 32.4 g/dL (ref 30.0–36.0)
MCV: 100.8 fL — ABNORMAL HIGH (ref 80.0–100.0)
Monocytes Absolute: 0.7 10*3/uL (ref 0.1–1.0)
Monocytes Relative: 10 %
Neutro Abs: 5.6 10*3/uL (ref 1.7–7.7)
Neutrophils Relative %: 80 %
Platelets: 107 10*3/uL — ABNORMAL LOW (ref 150–400)
RBC: 2.42 MIL/uL — ABNORMAL LOW (ref 4.22–5.81)
RDW: 14.9 % (ref 11.5–15.5)
WBC: 6.9 10*3/uL (ref 4.0–10.5)
nRBC: 0 % (ref 0.0–0.2)

## 2020-11-28 LAB — GLUCOSE, CAPILLARY
Glucose-Capillary: 112 mg/dL — ABNORMAL HIGH (ref 70–99)
Glucose-Capillary: 129 mg/dL — ABNORMAL HIGH (ref 70–99)
Glucose-Capillary: 187 mg/dL — ABNORMAL HIGH (ref 70–99)

## 2020-11-28 LAB — VITAMIN B12: Vitamin B-12: 1137 pg/mL — ABNORMAL HIGH (ref 180–914)

## 2020-11-28 LAB — ECHOCARDIOGRAM COMPLETE
AR max vel: 2.36 cm2
AV Area VTI: 2.35 cm2
AV Area mean vel: 2.36 cm2
AV Mean grad: 4 mmHg
AV Peak grad: 7.3 mmHg
Ao pk vel: 1.36 m/s
Height: 66 in
S' Lateral: 2.3 cm
Weight: 2310.42 oz

## 2020-11-28 LAB — TROPONIN I (HIGH SENSITIVITY): Troponin I (High Sensitivity): 39 ng/L — ABNORMAL HIGH (ref <18)

## 2020-11-28 LAB — CORTISOL-AM, BLOOD: Cortisol - AM: 26.9 ug/dL — ABNORMAL HIGH (ref 6.7–22.6)

## 2020-11-28 LAB — MAGNESIUM: Magnesium: 2.4 mg/dL (ref 1.7–2.4)

## 2020-11-28 MED ORDER — HYDROMORPHONE HCL 1 MG/ML IJ SOLN
0.5000 mg | INTRAMUSCULAR | Status: DC | PRN
Start: 2020-11-28 — End: 2020-12-04

## 2020-11-28 MED ORDER — BIOTENE DRY MOUTH MT LIQD: 15.0000 mL | OROMUCOSAL | Status: DC | PRN

## 2020-11-28 MED ORDER — GLYCOPYRROLATE 1 MG PO TABS: 1.0000 mg | ORAL_TABLET | ORAL | Status: DC | PRN

## 2020-11-28 MED ORDER — HALOPERIDOL LACTATE 5 MG/ML IJ SOLN: 0.5000 mg | INTRAMUSCULAR | Status: DC | PRN

## 2020-11-28 MED ORDER — HALOPERIDOL LACTATE 2 MG/ML PO CONC
0.5000 mg | ORAL | Status: DC | PRN
  Filled 2020-11-28: qty 0.3

## 2020-11-28 MED ORDER — HALOPERIDOL 0.5 MG PO TABS: 0.5000 mg | ORAL_TABLET | ORAL | Status: DC | PRN

## 2020-11-28 MED ORDER — POLYVINYL ALCOHOL 1.4 % OP SOLN
1.0000 [drp] | Freq: Four times a day (QID) | OPHTHALMIC | Status: DC | PRN
  Filled 2020-11-28: qty 15

## 2020-11-28 MED ORDER — ONDANSETRON HCL 4 MG/2ML IJ SOLN: 4.0000 mg | Freq: Four times a day (QID) | INTRAMUSCULAR | Status: DC | PRN

## 2020-11-28 MED ORDER — ALBUTEROL SULFATE (2.5 MG/3ML) 0.083% IN NEBU
2.5000 mg | INHALATION_SOLUTION | Freq: Four times a day (QID) | RESPIRATORY_TRACT | Status: DC | PRN
  Administered 2020-11-28: 2.5 mg via RESPIRATORY_TRACT
  Filled 2020-11-28: qty 3

## 2020-11-28 MED ORDER — ONDANSETRON 4 MG PO TBDP: 4.0000 mg | ORAL_TABLET | Freq: Four times a day (QID) | ORAL | Status: DC | PRN

## 2020-11-28 MED ORDER — ACETAMINOPHEN 650 MG RE SUPP: 650.0000 mg | Freq: Four times a day (QID) | RECTAL | Status: DC | PRN

## 2020-11-28 MED ORDER — AMLODIPINE BESYLATE 5 MG PO TABS
5.0000 mg | ORAL_TABLET | Freq: Every day | ORAL | Status: DC
  Administered 2020-11-28 – 2020-11-29 (×2): 5 mg via ORAL
  Filled 2020-11-28 (×2): qty 1

## 2020-11-28 MED ORDER — GLYCOPYRROLATE 0.2 MG/ML IJ SOLN: 0.2000 mg | INTRAMUSCULAR | Status: DC | PRN

## 2020-11-28 MED ORDER — FERROUS SULFATE 220 (44 FE) MG/5ML PO ELIX: 220.0000 mg | ORAL_SOLUTION | Freq: Every day | ORAL | Status: DC

## 2020-11-28 MED ORDER — ACETAMINOPHEN 325 MG PO TABS
650.0000 mg | ORAL_TABLET | Freq: Four times a day (QID) | ORAL | Status: DC | PRN
  Administered 2020-11-30: 650 mg via ORAL
  Filled 2020-11-28: qty 2

## 2020-11-28 NOTE — Progress Notes (Signed)
  Echocardiogram 2D Echocardiogram has been performed.  [name] F 11/28/2020, 4:06 PM

## 2020-11-28 NOTE — Evaluation (Signed)
Physical Therapy Evaluation Patient Details Name: [name] MRN: [id number] DOB: 06-24-1918 Today's Date: 11/28/2020   History of Present Illness  [name] is a 87 y.o. male admitted for severe sepsis due to urinary tract infection after presenting from home to [facility] ED evaluation of confusion. PMH: Parkinson's disease, prostate cancer s/p radioactive beads and prostatectomy, CKD 3A  Clinical Impression  Pt admitted with above diagnosis. Pt's spouse reports pt ambulates daily for exercises with her, arm in arm and she assists pt with bathing and dressing. Pt currently requiring +2 max A with mobility. Pt's spouse interpreting, pt appears to require repeating commands and increased time to follow 1 step commands. Pt tolerates standing for ~3 minutes, requests BSC but HR up to 155 and began having BM before able to get BSC so assisted back to supine and completed rolling to change linen and perform pericare. RN notified of HR, dark urine in condom cath and BM. Pt's spouse reports she is unable to care for pt at home. Unsure of pt's tolerance to activity, will attempt acute PT as able pending pt's vitals in therapeutic range. Recommending 24 hr assist at d/c, spouse unsure if long term care or hospice at this time. Pt currently with functional limitations due to the deficits listed below (see PT Problem List). Pt will benefit from skilled PT to increase their independence and safety with mobility to allow discharge to the venue listed below.       Follow Up Recommendations Supervision/Assistance - 24 hour (LTC vs hospice)    Equipment Recommendations  None recommended by PT    Recommendations for Other Services       Precautions / Restrictions Precautions Precautions: Fall Precaution Comments: monitor HR Restrictions Weight Bearing Restrictions: No      Mobility  Bed Mobility Overal bed mobility: Needs Assistance Bed Mobility: Supine to Sit;Sit to Supine;Rolling Rolling: Max  assist;+2 for physical assistance  Supine to sit: Max assist;+2 for physical assistance Sit to supine: Max assist;+2 for physical assistance   General bed mobility comments: pt's spouse providing cues, pt appears to require hand gestures and repeating of cues with increased time and max A +2 to complete task    Transfers Overall transfer level: Needs assistance Equipment used: 2 person hand held assist Transfers: Sit to/from Stand Sit to Stand: Mod assist;+2 physical assistance;+2 safety/equipment  General transfer comment: pt pulling on daugther's hand and therapist assisting with bedpad/gait belt, mod A +2 to achieve upright standing, once standing pt requires mod A to maintain static standing with BLE braced against bed and bil HHA  Ambulation/Gait  General Gait Details: not attempted, 155HR max noted in standing for ~3 minutes, assisted back to supine  Stairs            Wheelchair Mobility    Modified Rankin (Stroke Patients Only)       Balance Overall balance assessment: Needs assistance Sitting-balance support: Feet supported;Bilateral upper extremity supported Sitting balance-Leahy Scale: Poor Sitting balance - Comments: reliant on UE support and min A to sit EOB   Standing balance support: During functional activity;Bilateral upper extremity supported Standing balance-Leahy Scale: Poor Standing balance comment: mod A with bil HHA for limited duration        Pertinent Vitals/Pain Pain Assessment: No/denies pain    Home Living Family/patient expects to be discharged to:: Private residence Living Arrangements: Spouse/significant other Available Help at Discharge: Family Type of Home: House Home Access: Stairs to enter   [employer] of Steps: 1  threshold step Home Layout: Two level;Able to live on main level with bedroom/bathroom Home Equipment: Gilford Rile - 2 wheels;Cane - single point Additional Comments: lives with spouse 2 yo spouse    Prior  Function Level of Independence: Needs assistance   Gait / Transfers Assistance Needed: Spouse reports pt ambualtes arm in arm with her, walk daily for exercise, endorses 1 fall in last 6 months  ADL's / Homemaking Assistance Needed: Spouse reports she assists pt "some"        Hand Dominance        Extremity/Trunk Assessment   Upper Extremity Assessment Upper Extremity Assessment: Generalized weakness    Lower Extremity Assessment Lower Extremity Assessment: Generalized weakness    Cervical / Trunk Assessment Cervical / Trunk Assessment: Kyphotic  Communication   Communication: Prefers language other than English;Interpreter utilized  [employer] Arousal/Alertness: Awake/alert Behavior During Therapy: Flat affect Overall Cognitive Status: Difficult to assess  General Comments: Pt appears to require increased time and cues with single step commands, pt's family interpreting and report pt is always calm, but this appear more than normal.      General Comments General comments (skin integrity, edema, etc.): Pt's SpO2 >90% on RA throughout eval, HR 155 max noted in standing, HR remained ~110-130 for majority of treatment    Exercises     Assessment/Plan    PT Assessment Patient needs continued PT services  PT Problem List Decreased strength;Decreased range of motion;Decreased activity tolerance;Decreased balance;Decreased mobility;Decreased cognition       PT Treatment Interventions DME instruction;Gait training;Functional mobility training;Therapeutic activities;Therapeutic exercise;Balance training;Cognitive remediation;Patient/family education    PT Goals (Current goals can be found in the Care Plan section)  Acute Rehab PT Goals Patient Stated Goal: spouse states inability to care for pt at home PT Goal Formulation: With family Time For Goal Achievement: 12/12/20 Potential to Achieve Goals: Fair    Frequency Min 2X/week   Barriers to discharge         Co-evaluation               AM-PAC PT "6 Clicks" Mobility  Outcome Measure Help needed turning from your back to your side while in a flat bed without using bedrails?: Total Help needed moving from lying on your back to sitting on the side of a flat bed without using bedrails?: Total Help needed moving to and from a bed to a chair (including a wheelchair)?: Total Help needed standing up from a chair using your arms (e.g., wheelchair or bedside chair)?: Total Help needed to walk in hospital room?: Total Help needed climbing 3-5 steps with a railing? : Total 6 Click Score: 6    End of Session   Activity Tolerance: Treatment limited secondary to medical complications (Comment) (HR) Patient left: in bed;with call bell/phone within reach;with bed alarm set;with nursing/sitter in room;with family/visitor present Nurse Communication: Mobility status;Other (comment) (HR, BM, dark urine in condom cath) PT Visit Diagnosis: Other abnormalities of gait and mobility (R26.89);Muscle weakness (generalized) (M62.81);Adult, failure to thrive (R62.7)    Time: 1015-1105 PT Time Calculation (min) (ACUTE ONLY): 50 min   Charges:   PT Evaluation $PT Eval Moderate Complexity: 1 Mod PT Treatments $Therapeutic Activity: 8-22 mins         Tori Kadyn Guild PT, DPT 11/28/20, 1:38 PM

## 2020-11-28 NOTE — Plan of Care (Signed)
Pt slept through the night; Oxygen restarted 2nd to desaturation down to 78. Oxygen at 4L for 2 hours then down to 2L for the rest of the night; pt now saturating at 100; [place] removed; will closely monitor. No s/s of acute distress or pain reported or observed; call light within reach, bed at lowest position and mats on each side of bed for safety

## 2020-11-28 NOTE — Evaluation (Signed)
Occupational Therapy Evaluation Patient Details Name: [name] MRN: [id number] DOB: 1918/09/26 Today's Date: 11/28/2020    History of Present Illness Gee Habig is a 87 y.o. male admitted for severe sepsis due to urinary tract infection after presenting from home to [facility] ED evaluation of confusion. PMH: Parkinson's disease, prostate cancer s/p radioactive beads and prostatectomy, CKD 3A   Clinical Impression   Pt admitted with the above diagnoses and presents with below problem list. Pt will benefit from continued acute OT to address the below listed deficits and maximize independence with basic ADLs prior to d/c to venue below. Pt needed some assist with ADLs prior to admission, able to walk household distances with UE support. Pt  able to sit EOB a few minutes and complete 1 brief stand. HR up to 155. Spouse, daughter, and son present throughout session. Spouse reports she will not be able to care for pt at home. Plan to follow acutely.     Follow Up Recommendations  Other (comment);LTACH (vs hospice house)    Equipment Recommendations  None recommended by OT    Recommendations for Other Services       Precautions / Restrictions Precautions Precautions: Fall Precaution Comments: monitor HR Restrictions Weight Bearing Restrictions: No      Mobility Bed Mobility Overal bed mobility: Needs Assistance Bed Mobility: Supine to Sit;Sit to Supine;Rolling Rolling: Max assist;+2 for physical assistance   Supine to sit: Max assist;+2 for physical assistance Sit to supine: Max assist;+2 for physical assistance   General bed mobility comments: pt's spouse providing cues, pt appears to require hand gestures and repeating of cues with increased time and max A +2 to complete task    Transfers Overall transfer level: Needs assistance Equipment used: 2 person hand held assist Transfers: Sit to/from Stand Sit to Stand: Mod assist;+2 physical assistance;+2 safety/equipment          General transfer comment: pt pulling on daugther's hand and therapist assisting with bedpad/gait belt, mod A +2 to achieve upright standing, once standing pt requires mod A to maintain static standing with BLE braced against bed and bil HHA    Balance Overall balance assessment: Needs assistance Sitting-balance support: Feet supported;Bilateral upper extremity supported Sitting balance-Leahy Scale: Poor Sitting balance - Comments: reliant on UE support and min A to sit EOB   Standing balance support: During functional activity;Bilateral upper extremity supported Standing balance-Leahy Scale: Poor Standing balance comment: mod A with bil HHA for limited duration                           ADL either performed or assessed with clinical judgement   ADL Overall ADL's : Needs assistance/impaired Eating/Feeding: Sitting;Bed level;Total assistance;Maximal assistance   Grooming: Total assistance   Upper Body Bathing: Total assistance   Lower Body Bathing: Maximal assistance;+2 for physical assistance;Sit to/from stand   Upper Body Dressing : Total assistance;Sitting   Lower Body Dressing: Maximal assistance;+2 for physical assistance;Sit to/from stand                 General ADL Comments: Completed bed mobility, sat EOB several minutes, stood 1 time then back to bed.     Vision         Perception     Praxis      Pertinent Vitals/Pain Pain Assessment: No/denies pain     Hand Dominance     Extremity/Trunk Assessment Upper Extremity Assessment Upper Extremity Assessment: Generalized weakness   Lower  Extremity Assessment Lower Extremity Assessment: Generalized weakness;Defer to PT evaluation   Cervical / Trunk Assessment Cervical / Trunk Assessment: Kyphotic   Communication Communication Communication: Prefers language other than English;Interpreter utilized   [employer] Arousal/Alertness: Awake/alert Behavior During Therapy: Flat affect Overall  Cognitive Status: Difficult to assess                                 General Comments: Pt appears to require increased time and cues with single step commands, pt's family interpreting and report pt is always calm, but this appear more than normal.   General Comments  Pt's SpO2 >90% on RA throughout eval, HR 155 max noted in standing, HR remained ~110-130 for majority of treatment    Exercises     Shoulder Instructions      Home Living Family/patient expects to be discharged to:: Private residence Living Arrangements: Spouse/significant other Available Help at Discharge: Family Type of Home: House Home Access: Stairs to enter [employer] of Steps: 1 threshold step   Home Layout: Two level;Able to live on main level with bedroom/bathroom     Bathroom Shower/Tub: Tub/shower unit;Walk-in shower         Home Equipment: [place] Rile - 2 wheels;Cane - single point   Additional Comments: lives with 76 yo spouse      Prior Functioning/Environment Level of Independence: Needs assistance  Gait / Transfers Assistance Needed: Spouse reports pt ambualtes arm in arm with her, walk daily for exercise, endorses 1 fall in last 6 months ADL's / Homemaking Assistance Needed: Spouse reports she assists pt "some" Communication / Swallowing Assistance Needed: Spouse reports she assists pt "some"          OT Problem List: Decreased strength;Decreased activity tolerance;Impaired balance (sitting and/or standing);Decreased cognition;Decreased knowledge of use of DME or AE;Decreased knowledge of precautions;Cardiopulmonary status limiting activity      OT Treatment/Interventions: Self-care/ADL training;Energy conservation;DME and/or AE instruction;Patient/family education;Balance training;Therapeutic activities    OT Goals(Current goals can be found in the care plan section) Acute Rehab OT Goals Patient Stated Goal: spouse states inability to care for pt at home OT Goal  Formulation: With patient/family Time For Goal Achievement: 12/12/20 Potential to Achieve Goals: Fair ADL Goals Pt Will Transfer to Toilet: with max assist;stand pivot transfer;bedside commode Additional ADL Goal #1: Pt will complete bed mobility at max A level to prepare for EOB/OOB ADLs Additional ADL Goal #2: Pt will sit EOB at min guard assist level for 5 minutes.  OT Frequency: Min 2X/week   Barriers to D/C:            Co-evaluation PT/OT/SLP Co-Evaluation/Treatment: Yes Reason for Co-Treatment: For patient/therapist safety;Complexity of the patient's impairments (multi-system involvement);To address functional/ADL transfers   OT goals addressed during session: ADL's and self-care      AM-PAC OT "6 Clicks" Daily Activity     Outcome Measure Help from another person eating meals?: Total Help from another person taking care of personal grooming?: Total Help from another person toileting, which includes using toliet, bedpan, or urinal?: Total Help from another person bathing (including washing, rinsing, drying)?: Total Help from another person to put on and taking off regular upper body clothing?: Total Help from another person to put on and taking off regular lower body clothing?: Total 6 Click Score: 6   End of Session    Activity Tolerance: Patient limited by fatigue Patient left: in bed;with call bell/phone within reach;with bed  alarm set;with family/visitor present  OT Visit Diagnosis: Unsteadiness on feet (R26.81);Muscle weakness (generalized) (M62.81);Pain;History of falling (Z91.81);Other symptoms and signs involving cognitive function                Time: 1015-1105 OT Time Calculation (min): 50 min Charges:  OT General Charges $OT Visit: 1 Visit OT Evaluation $OT Eval Moderate Complexity: 1 Mod OT Treatments $Therapeutic Activity: 8-22 mins  Tyrone Schimke, OT Acute Rehabilitation Services Pager: [phone] Office: [phone]   Hortencia Pilar 11/28/2020, 1:51 PM

## 2020-11-28 NOTE — Progress Notes (Signed)
Speech Language Pathology Discharge Patient Details Name: [name] MRN: [id number] DOB: 05-23-1919 Today's Date: 11/28/2020 Time:  -     Patient discharged from SLP services secondary to  received order to dc services.  .  Please see latest therapy progress note for current level of functioning and progress toward goals.    Progress and discharge plan discussed with patient and/or caregiver: Patient unable to participate in discharge planning and no caregivers available  GO    Kathleen Lime, MS [place] Office [phone] Pager [phone]   Macario Golds 11/28/2020, 5:51 PM

## 2020-11-28 NOTE — Progress Notes (Signed)
Chaplain responded to Spiritual Consult.  Patient's wife and daughter bedside.  He is a professor and Chief Strategy Officer, and wrote a book called "[employer]."  Spanish is first language.  Wife was concerned that this chaplain was a woman. Daughter said that he "was open-minded" and it was OK that this chaplain was male.  Chaplain led service of scripture and prayer with wife translating into [place] was then asked to receive his confession. Patient then said he had not given his confession in a while and could not recall all his sins, but proceeded to pray in spite of it all. Visit came to a close when doctor entered room to discuss patient being possibly moved to [facility].  Staff there said a male chaplain may be available to him if he goes there. Rev. Tamsen Snider Pager [phone]

## 2020-11-28 NOTE — Progress Notes (Signed)
PROGRESS NOTE    [name]  GGY:[id number] DOB: Jun 17, 1918 DOA: 11/24/2020 PCP: Vernie Shanks, MD   Brief Narrative: 87 year old with past medical history significant for Parkinson's disease, prostate cancer, history of prostatectomy, CKD stage IIIa, baseline creatinine around 1.4, who presents status post fall at home and wife called EMS and brought him to the hospital. At baseline patient was able to walk 7 blocks daily.  She was concerned as patient was becoming more and more weak, generalized weakness, altered mental status, increased somnolence.  Denies history of fever, chills, cough, nausea, vomiting, diarrhea.  She also noticed his oral intake was poor.   Assessment & Plan:   Principal Problem:   Severe sepsis ([place]) Active Problems:   DM2 (diabetes mellitus, type 2) (HCC)   HLD (hyperlipidemia)   Anemia   Parkinson disease ([place])   Acute cystitis   Acute metabolic encephalopathy   Lactic acidosis   Acute kidney injury superimposed on CKD ([place])   Generalized weakness   Acute hyponatremia  1-Enterobacter Asburiae Bacteremia likely secondary to UTI, Sepsis:  -Patient presents  with fever,  temperature 102, tachypnea respiration rate 36, leukocytosis white blood cell 15, confusion, thrombocytopenia. -Blood Cultures: Positive for Enterobacter -Continue with cefepime.   -Patient has been afebrile, leukocytosis has resolved.  Repeated UA due to dark urine still have persistent white count and red blood cell.  Plan to continue with IV antibiotics for now, until further goals of care discussion.  -Repeated Blood culture 6/16 no growth to date.   2-Acute metabolic encephalopathy, secondary to sepsis/GNR bacteremia; -CT head: No acute intracranial process. -Patient was alert on  my evaluation. He was able to answer questions, he feels very weak, so weak that he can barely speak -Infectious process -TSH was normal, ammonia level was negative.   3-Failure to Thrive:  Suspect  this is related to sepsis on top of underlying Parkinson's disease and fragility/  I am concern  with his ability of regaining his strength after his hospitalization.  -TSH was normal, Ammonia normal, Mg Normal.  -Will check Cortisol level and B 12 level.   4-A fib RVR;  Secondary to fever and rigors 6/14. ECHO Pending.  Converted to sinus.  Decision was made to hold on anticoagulation due to thrombocytopenia and advance age.   5-Acute Hypoxic Respiratory failure;  Suspect related to hypoventilation, question if aspiration.  He was wean off oxygen yesterday (6/16). He was placed back on oxygen overnight due to desaturation/. This morning nurse was able to wean him off oxygen/. Concern for sleep apnea. Might need oxygen while sleep.  Patient notice to have tachypnea , BL expiratory wheezing, mild accessory muscle use. Repeated chest xx ray was negative for pulmonary edema, showed low lung volumes, mild atelectasis.  Nebulizer treatments order PRN.    6-Elevated troponin;  Demand ischemia related to sepsis.  Troponin 63--75--38  7-Mild Hyponatremia; received IV fluids.  DM type 2: HbA1c 5.2.  One out of two Blood culture positive for Staph epidermidis; Likely Contaminate.  Repeated blood culture no growth to date.   Parkinson Diseases:  Continue with Levodopa and carbidopa.   AKI on CKD stage a;  Improved with IV fluids. Cr down to 1.6 -from 2 on admission.   HTN; Start low dose Norvasc.   HLD; on statins.   Dysphagia; on Dysphagia 3 diet.   Anemia; start iron supplement.  Thrombocytopenia; in setting of infectious process.   Goals of care.  Mr Fini appears very weak,  deconditioned.  I discuss with Son, who was at bedside, infectious process is stable, no recent fevers or leukocytosis. Mr chason is experiencing FTT from infection, in the setting of underline parkinson diseases.  We have to see what is his ability to participate with therapy, plan was for 24 hours of  observation and further discussion with palliative care today for goals of care.   Discussed with Dr Domingo Cocking, Family is considering Hospice facility. Further discussion to take place   Nutrition Problem: Inadequate oral intake Etiology: lethargy/confusion    Signs/Symptoms: per patient/family report    Interventions: Ensure Enlive (each supplement provides 350kcal and 20 grams of protein), Magic cup  Estimated body mass index is 23.31 kg/m as calculated from the following:   Height as of this encounter: 5\' 6"  (1.676 m).   Weight as of this encounter: 65.5 kg.   DVT prophylaxis: SCD Code Status: DNR Family Communication:Son who was at bedside.  Disposition Plan:  Status is: Inpatient  Remains inpatient appropriate because:IV treatments appropriate due to intensity of illness or inability to take PO  Dispo: The patient is from: Home              Anticipated d/c is to:  to be determine              Patient currently is not medically stable to d/c.   Difficult to place patient No        Consultants:  Palliative care  Procedures:  ECHO pending.   Antimicrobials:    Subjective: He is alert, he feels weak and fair. He says he doesn't have strength  to speak. He has been coughing some. He denies SOB. He denies abdominal pain. Per son he was able to eat lunch yesterday and he was asking for scramble eggs yesterday.  He fell asleep, while I was taking to his son.  He doesn't remember when he had BM. Per nurse report he had BM earlier this morning.   Objective: Vitals:   11/27/20 1434 11/27/20 2127 11/28/20 0522 11/28/20 0600  BP: (!) 164/69 (!) 194/84 (!) 173/73   Pulse: 65 81 60   Resp: 14 20 16    Temp: 97.9 F (36.6 C) 98.2 F (36.8 C) 97.6 F (36.4 C)   TempSrc: Oral Oral Oral   SpO2: 98% 95% 99%   Weight:    65.5 kg  Height:        Intake/Output Summary (Last 24 hours) at 11/28/2020 0914 Last data filed at 11/28/2020 0524 Gross per 24 hour  Intake --   Output 1600 ml  Net -1600 ml   Filed Weights   11/26/20 0500 11/27/20 0700 11/28/20 0600  Weight: 68.1 kg 66.8 kg 65.5 kg    Examination:  General exam: Appears weak.  Respiratory system: Tachypnea, BL wheezing.  Cardiovascular system: S1 & S2 heard, RRR. Positive JVD Gastrointestinal system: Abdomen is nondistended, soft and nontender. No organomegaly or masses felt. Normal bowel sounds heard. Central nervous system: Alert and oriented. Follows command, moves Bilateral upper extremities.  Extremities: No edema   Data Reviewed: I have personally reviewed following labs and imaging studies  CBC: Recent Labs  Lab 11/24/20 1704 11/25/20 0230 11/25/20 0454 11/25/20 1138 11/25/20 1457 11/27/20 0537 11/28/20 0539  WBC 11.4*  --  10.1  --  15.0* 6.2 6.9  NEUTROABS 10.3*  --  9.1*  --   --  5.5 5.6  HGB 8.6*   < > 8.1* 9.1* 10.2* 8.0* 7.9*  HCT 25.5*   < >  24.8* 27.4* 30.8* 24.3* 24.4*  MCV 99.2  --  99.6  --  99.4 100.4* 100.8*  PLT 124*  --  104*  --  128* 111* 107*   < > = values in this interval not displayed.   Basic Metabolic Panel: Recent Labs  Lab 11/24/20 1704 11/25/20 0454 11/25/20 1457 11/26/20 2034 11/27/20 0537 11/28/20 0539  NA 133* 134* 135  --  139 142  K 4.5 4.0 4.5  --  3.6 3.6  CL 102 102 104  --  109 111  CO2 25 24 20*  --  22 24  GLUCOSE 111* 97 80  --  127* 138*  BUN 37* 39* 42*  --  61* 55*  CREATININE 2.13* 1.80* 1.86*  --  1.68* 1.66*  CALCIUM 8.0* 7.7* 8.0*  --  7.8* 7.8*  MG 2.1 2.0  --   --  2.3 2.4  PHOS  --   --   --  3.8 3.6  --    GFR: Estimated Creatinine Clearance: 21.9 mL/min (A) (by C-G formula based on SCr of 1.66 mg/dL (H)). Liver Function Tests: Recent Labs  Lab 11/24/20 1704 11/25/20 0454 11/25/20 1457 11/27/20 0537 11/28/20 0539  AST 36 40 46* 22 25  ALT 5 8 14 18  <5  ALKPHOS 57 48 61 52 50  BILITOT 1.1 0.9 1.0 0.6 0.3  PROT 6.3* 5.7* 6.5 5.7* 5.4*  ALBUMIN 3.2* 2.6* 2.8* 2.3* 2.0*   No results for input(s):  LIPASE, AMYLASE in the last 168 hours. Recent Labs  Lab 11/25/20 0455  AMMONIA 11   Coagulation Profile: Recent Labs  Lab 11/24/20 1704 11/25/20 0454  INR 1.1 1.4*   Cardiac Enzymes: No results for input(s): CKTOTAL, CKMB, CKMBINDEX, TROPONINI in the last 168 hours. BNP (last 3 results) No results for input(s): PROBNP in the last 8760 hours. HbA1C: No results for input(s): HGBA1C in the last 72 hours. CBG: Recent Labs  Lab 11/27/20 0559 11/27/20 1157 11/27/20 1748 11/28/20 0003 11/28/20 0613  GLUCAP 128* 138* 137* 187* 129*   Lipid Profile: No results for input(s): CHOL, HDL, LDLCALC, TRIG, CHOLHDL, LDLDIRECT in the last 72 hours. Thyroid Function Tests: Recent Labs    11/25/20 1623  TSH 2.620   Anemia Panel: Recent Labs    11/25/20 1341 11/25/20 1710  FOLATE  --  16.1  FERRITIN 99  --   TIBC 212*  --   IRON 10*  --    Sepsis Labs: Recent Labs  Lab 11/24/20 1704 11/24/20 1904  LATICACIDVEN 2.4* 1.4    Recent Results (from the past 240 hour(s))  Culture, blood (Routine x 2)     Status: Abnormal   Collection Time: 11/24/20  5:04 PM   Specimen: BLOOD RIGHT ARM  Result Value Ref Range Status   Specimen Description   Final    BLOOD RIGHT ARM Performed at [facility] [address], [place], [place] [address]    Special Requests   Final    BOTTLES DRAWN AEROBIC AND ANAEROBIC Blood Culture adequate volume Performed at [facility] [address], [place], [place] [address]    Culture  Setup Time   Final    GRAM NEGATIVE RODS IN BOTH AEROBIC AND ANAEROBIC BOTTLES Organism ID to follow CRITICAL RESULT CALLED TO, READ BACK BY AND VERIFIED WITH: [place] AT 7902 ON 11/25/20 [place] KJ    Culture (A)  Final    ENTEROBACTER ASBURIAE SUSCEPTIBILITIES PERFORMED ON PREVIOUS CULTURE  WITHIN THE LAST 5 DAYS. Performed at [facility], [place] [address], [place], [place] [address]    Report Status 11/27/2020 FINAL   Final  Blood Culture ID Panel (Reflexed)     Status: Abnormal   Collection Time: 11/24/20  5:04 PM  Result Value Ref Range Status   Enterococcus faecalis NOT DETECTED NOT DETECTED Final   Enterococcus Faecium NOT DETECTED NOT DETECTED Final   Listeria monocytogenes NOT DETECTED NOT DETECTED Final   Staphylococcus species NOT DETECTED NOT DETECTED Final   Staphylococcus aureus (BCID) NOT DETECTED NOT DETECTED Final   Staphylococcus epidermidis NOT DETECTED NOT DETECTED Final   Staphylococcus lugdunensis NOT DETECTED NOT DETECTED Final   Streptococcus species NOT DETECTED NOT DETECTED Final   Streptococcus agalactiae NOT DETECTED NOT DETECTED Final   Streptococcus pneumoniae NOT DETECTED NOT DETECTED Final   Streptococcus pyogenes NOT DETECTED NOT DETECTED Final   A.calcoaceticus-baumannii NOT DETECTED NOT DETECTED Final   Bacteroides fragilis NOT DETECTED NOT DETECTED Final   Enterobacterales DETECTED (A) NOT DETECTED Final    Comment: Enterobacterales represent a large order of gram negative bacteria, not a single organism. CRITICAL RESULT CALLED TO, READ BACK BY AND VERIFIED WITH: [place] AT 8676 ON 11/25/20 BY KJ    Enterobacter cloacae complex DETECTED (A) NOT DETECTED Final    Comment: CRITICAL RESULT CALLED TO, READ BACK BY AND VERIFIED WITH: [place] AT 1117 ON 11/25/20 BY KJ    Escherichia coli NOT DETECTED NOT DETECTED Final   Klebsiella aerogenes NOT DETECTED NOT DETECTED Final   Klebsiella oxytoca NOT DETECTED NOT DETECTED Final   Klebsiella pneumoniae NOT DETECTED NOT DETECTED Final   Proteus species NOT DETECTED NOT DETECTED Final   Salmonella species NOT DETECTED NOT DETECTED Final   Serratia marcescens NOT DETECTED NOT DETECTED Final   Haemophilus influenzae NOT DETECTED NOT DETECTED Final   Neisseria meningitidis NOT DETECTED NOT DETECTED Final   Pseudomonas aeruginosa NOT DETECTED NOT DETECTED Final   Stenotrophomonas maltophilia NOT  DETECTED NOT DETECTED Final   Candida albicans NOT DETECTED NOT DETECTED Final   Candida auris NOT DETECTED NOT DETECTED Final   Candida glabrata NOT DETECTED NOT DETECTED Final   Candida krusei NOT DETECTED NOT DETECTED Final   Candida parapsilosis NOT DETECTED NOT DETECTED Final   Candida tropicalis NOT DETECTED NOT DETECTED Final   Cryptococcus neoformans/gattii NOT DETECTED NOT DETECTED Final   CTX-M ESBL NOT DETECTED NOT DETECTED Final   Carbapenem resistance IMP NOT DETECTED NOT DETECTED Final   Carbapenem resistance KPC NOT DETECTED NOT DETECTED Final   Carbapenem resistance NDM NOT DETECTED NOT DETECTED Final   Carbapenem resist OXA 48 LIKE NOT DETECTED NOT DETECTED Final   Carbapenem resistance VIM NOT DETECTED NOT DETECTED Final    Comment: Performed at [facility], [address], [place], [place] [address]  Culture, blood (Routine x 2)     Status: Abnormal   Collection Time: 11/24/20  5:09 PM   Specimen: BLOOD LEFT ARM  Result Value Ref Range Status   Specimen Description   Final    BLOOD LEFT ARM Performed at [facility], [place] [address] [place], [place] [address]    Special Requests   Final    BOTTLES DRAWN AEROBIC AND ANAEROBIC Blood Culture adequate volume Performed at [facility] [address] [place],  [address]    Culture  Setup Time   Final    GRAM POSITIVE COCCI GRAM NEGATIVE  RODS Organism ID to follow IN BOTH AEROBIC AND ANAEROBIC BOTTLES CRITICAL RESULT CALLED TO, READ BACK BY AND VERIFIED WITH: PHARMD A.PHAN AT 1239 ON 11/25/2020 BY T.SAAD.    Culture (A)  Final    ENTEROBACTER ASBURIAE STAPHYLOCOCCUS EPIDERMIDIS THE SIGNIFICANCE OF ISOLATING THIS ORGANISM FROM A SINGLE SET OF BLOOD CULTURES WHEN MULTIPLE SETS ARE DRAWN IS UNCERTAIN. PLEASE NOTIFY THE MICROBIOLOGY DEPARTMENT WITHIN ONE WEEK IF SPECIATION AND SENSITIVITIES ARE REQUIRED. Performed at [facility], [place] [address] [place], [place] [address]     Report Status 11/27/2020 FINAL  Final   Organism ID, Bacteria ENTEROBACTER ASBURIAE  Final      Susceptibility   Enterobacter asburiae - MIC*    CEFAZOLIN >=64 RESISTANT Resistant     CEFEPIME <=0.12 SENSITIVE Sensitive     CEFTAZIDIME <=1 SENSITIVE Sensitive     CEFTRIAXONE <=0.25 SENSITIVE Sensitive     CIPROFLOXACIN <=0.25 SENSITIVE Sensitive     GENTAMICIN <=1 SENSITIVE Sensitive     IMIPENEM <=0.25 SENSITIVE Sensitive     TRIMETH/SULFA <=20 SENSITIVE Sensitive     PIP/TAZO <=4 SENSITIVE Sensitive     * ENTEROBACTER ASBURIAE  Blood Culture ID Panel (Reflexed)     Status: Abnormal   Collection Time: 11/24/20  5:09 PM  Result Value Ref Range Status   Enterococcus faecalis NOT DETECTED NOT DETECTED Final   Enterococcus Faecium NOT DETECTED NOT DETECTED Final   Listeria monocytogenes NOT DETECTED NOT DETECTED Final   Staphylococcus species DETECTED (A) NOT DETECTED Final    Comment: CRITICAL RESULT CALLED TO, READ BACK BY AND VERIFIED WITH: PHARMD A.PHAN AT 1239 ON 11/25/2020 BY T.SAAD.    Staphylococcus aureus (BCID) NOT DETECTED NOT DETECTED Final   Staphylococcus epidermidis DETECTED (A) NOT DETECTED Final    Comment: Methicillin (oxacillin) resistant coagulase negative staphylococcus. Possible blood culture contaminant (unless isolated from more than one blood culture draw or clinical case suggests pathogenicity). No antibiotic treatment is indicated for blood  culture contaminants. CRITICAL RESULT CALLED TO, READ BACK BY AND VERIFIED WITH: PHARMD A.PHAN AT 1239 ON 11/25/2020 BY T.SAAD.    Staphylococcus lugdunensis NOT DETECTED NOT DETECTED Final   Streptococcus species NOT DETECTED NOT DETECTED Final   Streptococcus agalactiae NOT DETECTED NOT DETECTED Final   Streptococcus pneumoniae NOT DETECTED NOT DETECTED Final   Streptococcus pyogenes NOT DETECTED NOT DETECTED Final   A.calcoaceticus-baumannii NOT DETECTED NOT DETECTED Final   Bacteroides fragilis NOT DETECTED NOT  DETECTED Final   Enterobacterales DETECTED (A) NOT DETECTED Final    Comment: Enterobacterales represent a large order of gram negative bacteria, not a single organism. CRITICAL RESULT CALLED TO, READ BACK BY AND VERIFIED WITH: PHARMD A.PHAN AT 1239 ON 11/25/2020 BY T.SAAD.    Enterobacter cloacae complex DETECTED (A) NOT DETECTED Final    Comment: CRITICAL RESULT CALLED TO, READ BACK BY AND VERIFIED WITH: PHARMD A.PHAN AT 1239 ON 11/25/2020 BY T.SAAD.    Escherichia coli NOT DETECTED NOT DETECTED Final   Klebsiella aerogenes NOT DETECTED NOT DETECTED Final   Klebsiella oxytoca NOT DETECTED NOT DETECTED Final   Klebsiella pneumoniae NOT DETECTED NOT DETECTED Final   Proteus species NOT DETECTED NOT DETECTED Final   Salmonella species NOT DETECTED NOT DETECTED Final   Serratia marcescens NOT DETECTED NOT DETECTED Final   Haemophilus influenzae NOT DETECTED NOT DETECTED Final   Neisseria meningitidis NOT DETECTED NOT DETECTED Final   Pseudomonas aeruginosa NOT DETECTED NOT DETECTED Final   Stenotrophomonas maltophilia NOT DETECTED NOT DETECTED Final  Candida albicans NOT DETECTED NOT DETECTED Final   Candida auris NOT DETECTED NOT DETECTED Final   Candida glabrata NOT DETECTED NOT DETECTED Final   Candida krusei NOT DETECTED NOT DETECTED Final   Candida parapsilosis NOT DETECTED NOT DETECTED Final   Candida tropicalis NOT DETECTED NOT DETECTED Final   Cryptococcus neoformans/gattii NOT DETECTED NOT DETECTED Final   CTX-M ESBL NOT DETECTED NOT DETECTED Final   Carbapenem resistance IMP NOT DETECTED NOT DETECTED Final   Carbapenem resistance KPC NOT DETECTED NOT DETECTED Final   Methicillin resistance mecA/C DETECTED (A) NOT DETECTED Final    Comment: CRITICAL RESULT CALLED TO, READ BACK BY AND VERIFIED WITH: PHARMD A.PHAN AT 1239 ON 11/25/2020 BY T.SAAD.    Carbapenem resistance NDM NOT DETECTED NOT DETECTED Final   Carbapenem resist OXA 48 LIKE NOT DETECTED NOT DETECTED Final    Carbapenem resistance VIM NOT DETECTED NOT DETECTED Final    Comment: Performed at [facility], [place] [address] [place], [place] [address]  Resp Panel by RT-PCR (Flu A&B, Covid) Nasopharyngeal Swab     Status: None   Collection Time: 11/24/20  5:12 PM   Specimen: Nasopharyngeal Swab; Nasopharyngeal(NP) swabs in vial transport medium  Result Value Ref Range Status   SARS Coronavirus 2 by RT PCR NEGATIVE NEGATIVE Final    Comment: (NOTE) SARS-CoV-2 target nucleic acids are NOT DETECTED.  The SARS-CoV-2 RNA is generally detectable in upper respiratory specimens during the acute phase of infection. The lowest concentration of SARS-CoV-2 viral copies this assay can detect is 138 copies/mL. A negative result does not preclude SARS-Cov-2 infection and should not be used as the sole basis for treatment or other patient management decisions. A negative result may occur with  improper specimen collection/handling, submission of specimen other than nasopharyngeal swab, presence of viral mutation(s) within the areas targeted by this assay, and inadequate number of viral copies(<138 copies/mL). A negative result must be combined with clinical observations, patient history, and epidemiological information. The expected result is Negative.  Fact Sheet for Patients:  EntrepreneurPulse.com.au  Fact Sheet for Healthcare Providers:  IncredibleEmployment.be  This test is no t yet approved or cleared by the [place] FDA and  has been authorized for detection and/or diagnosis of SARS-CoV-2 by FDA under an Emergency Use Authorization (EUA). This EUA will remain  in effect (meaning this test can be used) for the duration of the COVID-19 declaration under Section 564(b)(1) of the Act, 21 U.S.C.section 360bbb-3(b)(1), unless the authorization is terminated  or revoked sooner.       Influenza A by PCR NEGATIVE NEGATIVE Final   Influenza B by PCR NEGATIVE  NEGATIVE Final    Comment: (NOTE) The Xpert Xpress SARS-CoV-2/FLU/RSV plus assay is intended as an aid in the diagnosis of influenza from Nasopharyngeal swab specimens and should not be used as a sole basis for treatment. Nasal washings and aspirates are unacceptable for Xpert Xpress SARS-CoV-2/FLU/RSV testing.  Fact Sheet for Patients: EntrepreneurPulse.com.au  Fact Sheet for Healthcare Providers: IncredibleEmployment.be  This test is not yet approved or cleared by the [place] FDA and has been authorized for detection and/or diagnosis of SARS-CoV-2 by FDA under an Emergency Use Authorization (EUA). This EUA will remain in effect (meaning this test can be used) for the duration of the COVID-19 declaration under Section 564(b)(1) of the Act, 21 U.S.C. section 360bbb-3(b)(1), unless the authorization is terminated or revoked.  Performed at [facility], [place] [address], [place], [place] [address]  Urine culture     Status: Abnormal   Collection Time: 11/24/20  8:30 PM   Specimen: In/Out Cath Urine  Result Value Ref Range Status   Specimen Description   Final    IN/OUT CATH URINE Performed at [facility], [place] [address], [place], [place] [address]    Special Requests   Final    NONE Performed at [facility], [place] [address] [place], [place] [address]    Culture 60,000 COLONIES/mL ENTEROBACTER SPECIES (A)  Final   Report Status 11/27/2020 FINAL  Final   Organism ID, Bacteria ENTEROBACTER SPECIES (A)  Final      Susceptibility   Enterobacter species - MIC*    CEFAZOLIN >=64 RESISTANT Resistant     CEFEPIME <=0.12 SENSITIVE Sensitive     CEFTRIAXONE <=0.25 SENSITIVE Sensitive     CIPROFLOXACIN <=0.25 SENSITIVE Sensitive     GENTAMICIN <=1 SENSITIVE Sensitive     IMIPENEM <=0.25 SENSITIVE Sensitive     NITROFURANTOIN <=16 SENSITIVE Sensitive     TRIMETH/SULFA <=20 SENSITIVE  Sensitive     PIP/TAZO <=4 SENSITIVE Sensitive     * 60,000 COLONIES/mL ENTEROBACTER SPECIES  Culture, blood (routine x 2)     Status: None (Preliminary result)   Collection Time: 11/27/20  5:37 AM   Specimen: BLOOD LEFT ARM  Result Value Ref Range Status   Specimen Description   Final    BLOOD LEFT ARM Performed at [facility] [address], [place], [place] [address]    Special Requests   Final    BOTTLES DRAWN AEROBIC ONLY Blood Culture adequate volume Performed at [facility] [address], [place], [place] [address]    Culture   Final    NO GROWTH < 24 HOURS Performed at [facility] [address], [place], [place] [address]    Report Status PENDING  Incomplete  Culture, blood (routine x 2)     Status: None (Preliminary result)   Collection Time: 11/27/20  5:37 AM   Specimen: BLOOD  Result Value Ref Range Status   Specimen Description   Final    BLOOD LEFT WRIST Performed at [facility] [address], [place], [place] [address]    Special Requests   Final    BOTTLES DRAWN AEROBIC ONLY Blood Culture adequate volume Performed at [facility] [address] [place], [place] [address]    Culture   Final    NO GROWTH < 24 HOURS Performed at [facility] [address] [place], [place] [address]    Report Status PENDING  Incomplete         Radiology Studies: DG CHEST PORT 1 VIEW  Result Date: 11/26/2020 CLINICAL DATA:  87 year old male with hypoxia. EXAM: PORTABLE CHEST 1 VIEW COMPARISON:  Chest radiograph dated 11/25/2020 FINDINGS: Minimal right lung base atelectasis. Trace right pleural effusion may be present. No focal consolidation or pneumothorax. Mild cardiomegaly. Atherosclerotic calcification of the aorta. No acute osseous pathology. IMPRESSION: 1. No focal consolidation. 2. Mild cardiomegaly. Electronically Signed   By: Anner Crete M.D.   On: 11/26/2020  20:40   DG Swallowing Func-Speech Pathology  Result Date: 11/26/2020 Formatting of this result is different from the original. Objective Swallowing Evaluation: Type of Study: MBS-Modified Barium Swallow Study  Patient Details Name: Jasani Dolney MRN: [id number] Date of Birth: [birth year] Today's Date: 11/26/2020 Time: SLP Start Time (ACUTE ONLY): 9381 -SLP Stop Time (ACUTE ONLY):  1300 SLP Time Calculation (min) (ACUTE ONLY): 27 min Past Medical History: Past Medical History: Diagnosis Date  Allergy   rhinitis  Diabetes mellitus   dx 3-11, A1C 6.7  Gout   Hiatal hernia   Hyperlipidemia   Parkinson disease ([place]) 10/23/2015  Prostate CA ([place]) 2002  s/p  x-ray therapy and surgery  Skin cancer   Trigeminal neuralgia   h/o , status post gamma knife procedure and trial with multiple drugs Past Surgical History: Past Surgical History: Procedure Laterality Date  CHOLECYSTECTOMY   HPI: Mr Stolar is a 87 yo male adm to [facility] with AMS, UTI after 2 days of somnolence and confusion at home.  CXR showed right basilar ATX and effusion.  Pt also with h/o prostate cancer, large hiatal hernia repair, distal esophageal diverticulum.  He resides with his wife at home.  Swallow eval ordered. Wife and pt deny pt having h/o dysphagia, sensing reflux or coughing with intake.  Wife does report he lost weight prior to admission.  Plan for palliative meeting todoay. Rapid response called yesterday due to pt having rigors, tachypnea, and low grade fever.  Subjective: pt awake in bed, wife present and desired mbs Assessment / Plan / Recommendation [facility] IP CLINICAL IMPRESSIONS 11/26/2020 Clinical Impression Mild oropharyngoesophageal dysphagia noted with trace aspiration of thin due to decreased oral control allowing spillage into larynx prior to swallow.  Aspiration with thin was just below vocal cords, cleared with cued cough and was prevented with small single sips of liquids.  Mildly prolonged mastication and oral transiting of cracker noted.    Pharyngeal swallow is strong without pharyngeal retention of solids including pudding and cracker.  Prominent CP/cricopharyngeal bar with suspected developing diverticulum likley due to decreased UES relaxation noted.  Thus cervical esophageal backflow of thin observed without pt sensation.  He did not aspirate mild pyriform sinus//CP backflowed material but certainly is at risk.  Upon esophageal sweep, pt appeared with esophageal diverticulum (noted in his medical record) and suspected mild motility issues - but radiologist not present to confirm and this test does not assess esophageal phase of swallow.  Suspect pt's primary symptoms are due to his cervical esophageal dysphagia and reviewed compensation strategies with his wife using video and teach back.  Advised pt consume drinks that are slightly thicker with his meals *eg V8, OJ, coffee with cream, Ensure, etc if increased comfort, decreased coughing with intake.  Recommend thin drinks (eg water) between meals for hydration.  Pt will benefit from staying upright after meals (wife reports he lays down at home after eating) and consuming frequent small meals as able.  Crushing medications may help to decr risk of whole lodging at cricopharyngeal region and potentially backflowing/aspirated.  Will follow up for dysphagia management/education.  Pt's swallow ability instrumentally was much better than presented clinically.  He did NOT cough reflexively during his MBS. SLP Visit Diagnosis Dysphagia, pharyngoesophageal phase (R13.14);Dysphagia, oropharyngeal phase (R13.12) Attention and concentration deficit following -- Frontal lobe and executive function deficit following -- Impact on safety and function Moderate aspiration risk   [facility] IP TREATMENT RECOMMENDATION 11/26/2020 Treatment Recommendations Therapy as outlined in treatment plan below   Prognosis 11/26/2020 Prognosis for Safe Diet Advancement Good Barriers to Reach Goals Time post onset Barriers/Prognosis  Comment -- [facility] IP DIET RECOMMENDATION 11/26/2020 SLP Diet Recommendations SLP Diet Recommendations: Dysphagia 3 (Mech soft) solids;Thin liquid - slightly thicker drinks with meals if prevents coughing, thin water between meals for hydration  Liquid Administration via: Cup;Straw  Medication  Administration: Crushed with puree  Supervision: Patient able to self feed  Compensations: Slow rate;Small sips/bites  Postural Changes: Remain semi-upright after after feeds/meals (Comment);Seated upright at 90 degrees; small frequent meals  Oral Care Recommendations: Oral care BID Liquid Administration via  Medication Administration  Compensations  Postural Changes    [facility] IP OTHER RECOMMENDATIONS 11/26/2020 Recommended Consults -- Oral Care Recommendations Oral care BID Other Recommendations --   [facility] IP FOLLOW UP RECOMMENDATIONS 11/26/2020 Follow up Recommendations None   [facility] IP FREQUENCY AND DURATION 11/26/2020 Speech Therapy Frequency (ACUTE ONLY) min 1 x/week Treatment Duration 1 week      [facility] IP ORAL PHASE 11/26/2020 Oral Phase Impaired Oral - Pudding Teaspoon -- Oral - Pudding Cup -- Oral - Honey Teaspoon -- Oral - Honey Cup -- Oral - Nectar Teaspoon WFL;Decreased bolus cohesion;Premature spillage Oral - Nectar Cup [facility];Decreased bolus cohesion;Premature spillage Oral - Nectar Straw -- Oral - Thin Teaspoon WFL;Premature spillage;Decreased bolus cohesion Oral - Thin Cup [facility];Decreased bolus cohesion;Premature spillage Oral - Thin Straw WFL;Premature spillage;Decreased bolus cohesion Oral - Puree WFL Oral - Mech Soft Delayed oral transit;Impaired mastication;Lingual pumping Oral - Regular -- Oral - Multi-Consistency -- Oral - Pill -- Oral Phase - Comment --  [facility] IP PHARYNGEAL PHASE 11/26/2020 Pharyngeal Phase Impaired Pharyngeal- Pudding Teaspoon -- Pharyngeal -- Pharyngeal- Pudding Cup -- Pharyngeal -- Pharyngeal- Honey Teaspoon -- Pharyngeal -- Pharyngeal- Honey Cup -- Pharyngeal -- Pharyngeal- Nectar Teaspoon WFL Pharyngeal  Material does not enter airway Pharyngeal- Nectar Cup [facility];Pharyngeal residue - cp segment Pharyngeal Material does not enter airway Pharyngeal- Nectar Straw Pharyngeal residue - cp segment;Delayed swallow initiation-vallecula Pharyngeal Material does not enter airway Pharyngeal- Thin Teaspoon WFL;Delayed swallow initiation-pyriform sinuses;Pharyngeal residue - cp segment Pharyngeal Material does not enter airway Pharyngeal- Thin Cup Delayed swallow initiation-pyriform sinuses;Reduced laryngeal elevation;Reduced airway/laryngeal closure;Penetration/Aspiration during swallow;Pharyngeal residue - cp segment Pharyngeal Material enters airway, remains ABOVE vocal cords then ejected out Pharyngeal- Thin Straw Delayed swallow initiation-pyriform sinuses;Reduced airway/laryngeal closure;Reduced laryngeal elevation;Penetration/Aspiration before swallow;Trace aspiration;Pharyngeal residue - cp segment Pharyngeal -- Pharyngeal- Puree WFL Pharyngeal Material does not enter airway Pharyngeal- Mechanical Soft WFL Pharyngeal Material does not enter airway Pharyngeal- Regular -- Pharyngeal -- Pharyngeal- Multi-consistency -- Pharyngeal -- Pharyngeal- Pill -- Pharyngeal -- Pharyngeal Comment use of cup prevents trace amount of aspiration, disoordinated swallow with thin allowed liquids to spill into larynx prior to swallow trigger - very trace amount of aspiration of thin that cleared  [facility] IP CERVICAL ESOPHAGEAL PHASE 11/26/2020 Cervical Esophageal Phase Impaired Pudding Teaspoon -- Pudding Cup -- Honey Teaspoon -- Honey Cup -- Nectar Teaspoon -- Nectar Cup Reduced cricopharyngeal relaxation;Prominent cricopharyngeal segment Nectar Straw Reduced cricopharyngeal relaxation;Prominent cricopharyngeal segment Thin Teaspoon Reduced cricopharyngeal relaxation;Prominent cricopharyngeal segment Thin Cup Prominent cricopharyngeal segment;Reduced cricopharyngeal relaxation;Esophageal backflow into cervical esophagus Thin Straw Reduced  cricopharyngeal relaxation;Prominent cricopharyngeal segment;Esophageal backflow into cervical esophagus Puree Reduced cricopharyngeal relaxation Mechanical Soft Reduced cricopharyngeal relaxation Regular -- Multi-consistency -- Pill -- Cervical Esophageal Comment -- Macario Golds 11/26/2020, 2:15 PM              VAS [place] LOWER EXTREMITY VENOUS (DVT)  Result Date: 11/27/2020  Lower Venous DVT Study Patient Name:  YOGI ARTHER  Date of Exam:   11/27/2020 Medical Rec #: [id number]     Accession #:    [id number] Date of Birth: 1918-09-30     Patient Gender: M Patient Age:   89Y Exam Location:  [facility] Procedure:      VAS [place] LOWER EXTREMITY VENOUS (DVT)  Referring Phys: [id number] A CALDWELL POWELL JR --------------------------------------------------------------------------------  Indications: Elevated d-dimer.  Comparison Study: 04-18-2020 Right lower extremity venous was negative for DVT. Performing Technologist: Darlin Coco RDMS,RVT  Examination Guidelines: A complete evaluation includes B-mode imaging, spectral Doppler, color Doppler, and power Doppler as needed of all accessible portions of each vessel. Bilateral testing is considered an integral part of a complete examination. Limited examinations for reoccurring indications may be performed as noted. The reflux portion of the exam is performed with the patient in reverse Trendelenburg.  +---------+---------------+---------+-----------+----------+--------------+ RIGHT    CompressibilityPhasicitySpontaneityPropertiesThrombus Aging +---------+---------------+---------+-----------+----------+--------------+ CFV      Full           Yes      Yes                                 +---------+---------------+---------+-----------+----------+--------------+ SFJ      Full                                                        +---------+---------------+---------+-----------+----------+--------------+ FV Prox  Full                                                         +---------+---------------+---------+-----------+----------+--------------+ FV Mid   Full                                                        +---------+---------------+---------+-----------+----------+--------------+ FV DistalFull                                                        +---------+---------------+---------+-----------+----------+--------------+ PFV      Full                                                        +---------+---------------+---------+-----------+----------+--------------+ POP      Full           Yes      Yes                                 +---------+---------------+---------+-----------+----------+--------------+ PTV      Full                                                        +---------+---------------+---------+-----------+----------+--------------+ PERO     Full                                                        +---------+---------------+---------+-----------+----------+--------------+   +---------+---------------+---------+-----------+----------+--------------+  LEFT     CompressibilityPhasicitySpontaneityPropertiesThrombus Aging +---------+---------------+---------+-----------+----------+--------------+ CFV      Full           Yes      Yes                                 +---------+---------------+---------+-----------+----------+--------------+ SFJ      Full                                                        +---------+---------------+---------+-----------+----------+--------------+ FV Prox  Full                                                        +---------+---------------+---------+-----------+----------+--------------+ FV Mid   Full                                                        +---------+---------------+---------+-----------+----------+--------------+ FV DistalFull                                                         +---------+---------------+---------+-----------+----------+--------------+ PFV      Full                                                        +---------+---------------+---------+-----------+----------+--------------+ POP      Full           Yes      Yes                                 +---------+---------------+---------+-----------+----------+--------------+ PTV      Full                                                        +---------+---------------+---------+-----------+----------+--------------+ PERO     Full                                                        +---------+---------------+---------+-----------+----------+--------------+     Summary: RIGHT: - There is no evidence of deep vein thrombosis in the lower extremity.  - No cystic structure found in the popliteal fossa.  LEFT: - There is no evidence of deep vein thrombosis in the lower extremity.  - No  cystic structure found in the popliteal fossa.  *See table(s) above for measurements and observations. Electronically signed by Jamelle Haring on 11/27/2020 at 2:10:18 PM.    Final         Scheduled Meds:  amLODipine  5 mg Oral Daily   Carbidopa-Levodopa ER  1.5 tablet Oral TID   feeding supplement  237 mL Oral BID BM   insulin aspart  0-6 Units Subcutaneous Q6H   Continuous Infusions:  ceFEPime (MAXIPIME) IV 2 g (11/27/20 1623)   dextrose 5% lactated ringers 50 mL/hr at 11/27/20 1527     LOS: 4 days    Time spent: 35 minutes.     Elmarie Shiley, MD [facility]   If 7PM-7AM, please contact night-coverage [email or web address]  11/28/2020, 9:14 AM

## 2020-11-29 DIAGNOSIS — N3001 Acute cystitis with hematuria: Secondary | ICD-10-CM | POA: Diagnosis not present

## 2020-11-29 DIAGNOSIS — Z7189 Other specified counseling: Secondary | ICD-10-CM | POA: Diagnosis not present

## 2020-11-29 DIAGNOSIS — Z515 Encounter for palliative care: Secondary | ICD-10-CM | POA: Diagnosis not present

## 2020-11-29 DIAGNOSIS — R6520 Severe sepsis without septic shock: Secondary | ICD-10-CM | POA: Diagnosis not present

## 2020-11-29 DIAGNOSIS — A419 Sepsis, unspecified organism: Secondary | ICD-10-CM | POA: Diagnosis not present

## 2020-11-29 LAB — GLUCOSE, CAPILLARY: Glucose-Capillary: 152 mg/dL — ABNORMAL HIGH (ref 70–99)

## 2020-11-29 MED ORDER — AMLODIPINE BESYLATE 10 MG PO TABS: 10.0000 mg | ORAL_TABLET | Freq: Every day | ORAL | Status: DC

## 2020-11-29 MED ORDER — AMLODIPINE BESYLATE 5 MG PO TABS
5.0000 mg | ORAL_TABLET | Freq: Every day | ORAL | Status: DC
  Administered 2020-11-30 – 2020-12-01 (×2): 5 mg via ORAL
  Filled 2020-11-29 (×2): qty 1

## 2020-11-29 MED ORDER — METOPROLOL TARTRATE 25 MG PO TABS
12.5000 mg | ORAL_TABLET | Freq: Two times a day (BID) | ORAL | Status: DC
  Administered 2020-11-29 – 2020-12-04 (×10): 12.5 mg via ORAL
  Filled 2020-11-29 (×10): qty 1

## 2020-11-29 MED ORDER — SODIUM CHLORIDE 0.9 % IV SOLN
1.0000 g | Freq: Two times a day (BID) | INTRAVENOUS | Status: DC
  Administered 2020-11-29 – 2020-12-02 (×6): 1 g via INTRAVENOUS
  Filled 2020-11-29 (×6): qty 1

## 2020-11-29 MED ORDER — HYDRALAZINE HCL 10 MG PO TABS
10.0000 mg | ORAL_TABLET | Freq: Three times a day (TID) | ORAL | Status: DC | PRN
  Administered 2020-11-29 – 2020-12-01 (×3): 10 mg via ORAL
  Filled 2020-11-29 (×3): qty 1

## 2020-11-29 NOTE — Progress Notes (Signed)
Pharmacy Antibiotic Note  [name] is a 87 y.o. male admitted on 11/24/2020 with bacteremia.  Pharmacy has been consulted for meropenem dosing.  Plan: Meropenem 1g IV q12 per current renal function  Height: 5\' 6"  (167.6 cm) Weight: 67.4 kg (148 lb 9.4 oz) IBW/kg (Calculated) : 63.8  Temp (24hrs), Avg:98.2 F (36.8 C), Min:98 F (36.7 C), Max:98.3 F (36.8 C)  Recent Labs  Lab 11/24/20 1704 11/24/20 1904 11/25/20 0454 11/25/20 1457 11/27/20 0537 11/28/20 0539  WBC 11.4*  --  10.1 15.0* 6.2 6.9  CREATININE 2.13*  --  1.80* 1.86* 1.68* 1.66*  LATICACIDVEN 2.4* 1.4  --   --   --   --     Estimated Creatinine Clearance: 21.9 mL/min (A) (by C-G formula based on SCr of 1.66 mg/dL (H)).    No Known Allergies   Thank you for allowing pharmacy to be a part of this patient's care.  Kara Mead 11/29/2020 3:21 PM

## 2020-11-29 NOTE — Plan of Care (Signed)
Pt slept ok, very easy to awake. He was turned q2hrs and mepilex + barrier cream was applied to sacrum area; no open wound observed at the moment, just some redness.  Condom catheter changed for Primafit with wall suction; Pt shows no s/s of acute distress or pain; call light within reach, bed at lowest position and door open for monitoring.

## 2020-11-29 NOTE — Progress Notes (Addendum)
PROGRESS NOTE    [name]  DJS:[id number] DOB: 1918-12-18 DOA: 11/24/2020 PCP: Vernie Shanks, MD   Brief Narrative: 87 year old with past medical history significant for Parkinson's disease, prostate cancer, history of prostatectomy, CKD stage IIIa, baseline creatinine around 1.4, who presents status post fall at home and wife called EMS and brought him to the hospital. At baseline patient was able to walk 7 blocks daily.  She was concerned as patient was becoming more and more weak, generalized weakness, altered mental status, increased somnolence.  Denies history of fever, chills, cough, nausea, vomiting, diarrhea.  She also noticed his oral intake was poor.   Assessment & Plan:   Principal Problem:   Severe sepsis ([place]) Active Problems:   DM2 (diabetes mellitus, type 2) (HCC)   HLD (hyperlipidemia)   Anemia   Parkinson disease ([place])   Acute cystitis   Acute metabolic encephalopathy   Lactic acidosis   Acute kidney injury superimposed on CKD ([place])   Generalized weakness   Acute hyponatremia  1-Enterobacter Asburiae Bacteremia likely secondary to UTI, Sepsis:  -Patient presents  with fever,  Temperature 102, Tachypnea, Respiration rate 36, leukocytosis white blood cell 15, confusion, thrombocytopenia. -Blood Cultures: Positive for Enterobacter. -Treated with cefepime from 6/13 until 6/17. Antibiotics discontinue because patient was transition to comfort care.  -Repeated Blood culture 6/16 no growth to date.  -Family considering resumption of care. Would probably use different antibiotics.   Addendum; Plan to resume antibiotics.   2-Acute metabolic encephalopathy, secondary to sepsis/GNR bacteremia; -CT head: No acute intracranial process. -Patient was alert on  my evaluation. He was able to answer questions, he feels very weak, so weak that he can barely speak -Infectious process -TSH was normal, ammonia level was negative.  -He is more alert today, per family he is  making more sense.  -He is asking to be out of bed. Will Get PT, to see how patient perform.   3-Failure to Thrive:  Suspect this is related to sepsis on top of underlying Parkinson's disease and fragility/  I am concern  with his ability of regaining his strength after his hospitalization.  -TSH was normal, Ammonia normal, Mg Normal.  - Cortisol level and B 12 level. Not low.   4-A fib RVR;  Secondary to fever and rigors 6/14. ECHO Normal EF.  Decision was made to hold on anticoagulation due to thrombocytopenia and advance age.  HR at 76 today.   5-Acute Hypoxic Respiratory failure;  Suspect related to hypoventilation, question if aspiration.  Concern for sleep apnea. Might need oxygen while sleep.  He has BL wheezing today, not as tachypnea. Coughing phlegm.  Nebulizer treatments order PRN.    6-Elevated troponin;  Demand ischemia related to sepsis.  Troponin 63--75--38  7-Mild Hyponatremia; Received IV fluids.  DM type 2: HbA1c 5.2.  One out of two Blood culture positive for Staph epidermidis; Likely Contaminate.  Repeated blood culture no growth to date.   Parkinson Diseases:  Continue with Levodopa and carbidopa.   AKI on CKD stage a;  Improved with IV fluids. Cr down to 1.6 -from 2 on admission.   HTN; Started  low dose Norvasc.   HLD; on statins.   Dysphagia; on Dysphagia 3 diet.   Anemia; started iron supplement.  Thrombocytopenia; in setting of infectious process.   Goals of care.  [name] is more alert today, less confuse per family.  Family is having second thought about comfort care. Patient is more alert, and asking  to be up. Plan is to get PT to see how he does. Dr Domingo Cocking will meet with them again today/   Nutrition Problem: Inadequate oral intake Etiology: lethargy/confusion    Signs/Symptoms: per patient/family report    Interventions: Ensure Enlive (each supplement provides 350kcal and 20 grams of protein), Magic cup  Estimated body  mass index is 23.98 kg/m as calculated from the following:   Height as of this encounter: 5\' 6"  (1.676 m).   Weight as of this encounter: 67.4 kg.   DVT prophylaxis: SCD Code Status: DNR Family Communication: Son and wife at bedside.  Disposition Plan:  Status is: Inpatient  Remains inpatient appropriate because:IV treatments appropriate due to intensity of illness or inability to take PO  Dispo: The patient is from: Home              Anticipated d/c is to:  to be determine              Patient currently is not medically stable to d/c.   Difficult to place patient No        Consultants:  Palliative care  Procedures:  ECHO Normal EF  Antimicrobials:    Subjective: He is more alert today, asking to be up and out of bed.  He denies Dyspnea. He has been coughing phlegm. He denies pain.   Objective: Vitals:   11/28/20 0600 11/28/20 1111 11/28/20 1349 11/29/20 0437  BP:   (!) 166/84 (!) 177/82  Pulse:   98 75  Resp:   16 18  Temp:   97.9 F (36.6 C) 98 F (36.7 C)  TempSrc:   Oral Oral  SpO2:  95% 91% 93%  Weight: 65.5 kg   67.4 kg  Height:        Intake/Output Summary (Last 24 hours) at 11/29/2020 1229 Last data filed at 11/29/2020 1045 Gross per 24 hour  Intake 120 ml  Output --  Net 120 ml    Filed Weights   11/27/20 0700 11/28/20 0600 11/29/20 0437  Weight: 66.8 kg 65.5 kg 67.4 kg    Examination:  General exam: Alert Respiratory system: BL wheezing.  Cardiovascular system: S 1, S 2 RRR Gastrointestinal system: BS present, soft, nt Central nervous system: Alert, follows command, move upper and lower extremities.  Extremities: No edema   Data Reviewed: I have personally reviewed following labs and imaging studies  CBC: Recent Labs  Lab 11/24/20 1704 11/25/20 0230 11/25/20 0454 11/25/20 1138 11/25/20 1457 11/27/20 0537 11/28/20 0539  WBC 11.4*  --  10.1  --  15.0* 6.2 6.9  NEUTROABS 10.3*  --  9.1*  --   --  5.5 5.6  HGB 8.6*   < >  8.1* 9.1* 10.2* 8.0* 7.9*  HCT 25.5*   < > 24.8* 27.4* 30.8* 24.3* 24.4*  MCV 99.2  --  99.6  --  99.4 100.4* 100.8*  PLT 124*  --  104*  --  128* 111* 107*   < > = values in this interval not displayed.    Basic Metabolic Panel: Recent Labs  Lab 11/24/20 1704 11/25/20 0454 11/25/20 1457 11/26/20 2034 11/27/20 0537 11/28/20 0539  NA 133* 134* 135  --  139 142  K 4.5 4.0 4.5  --  3.6 3.6  CL 102 102 104  --  109 111  CO2 25 24 20*  --  22 24  GLUCOSE 111* 97 80  --  127* 138*  BUN 37* 39* 42*  --  61* 55*  CREATININE 2.13* 1.80* 1.86*  --  1.68* 1.66*  CALCIUM 8.0* 7.7* 8.0*  --  7.8* 7.8*  MG 2.1 2.0  --   --  2.3 2.4  PHOS  --   --   --  3.8 3.6  --     GFR: Estimated Creatinine Clearance: 21.9 mL/min (A) (by C-G formula based on SCr of 1.66 mg/dL (H)). Liver Function Tests: Recent Labs  Lab 11/24/20 1704 11/25/20 0454 11/25/20 1457 11/27/20 0537 11/28/20 0539  AST 36 40 46* 22 25  ALT 5 8 14 18  <5  ALKPHOS 57 48 61 52 50  BILITOT 1.1 0.9 1.0 0.6 0.3  PROT 6.3* 5.7* 6.5 5.7* 5.4*  ALBUMIN 3.2* 2.6* 2.8* 2.3* 2.0*    No results for input(s): LIPASE, AMYLASE in the last 168 hours. Recent Labs  Lab 11/25/20 0455  AMMONIA 11    Coagulation Profile: Recent Labs  Lab 11/24/20 1704 11/25/20 0454  INR 1.1 1.4*    Cardiac Enzymes: No results for input(s): CKTOTAL, CKMB, CKMBINDEX, TROPONINI in the last 168 hours. BNP (last 3 results) No results for input(s): PROBNP in the last 8760 hours. HbA1C: No results for input(s): HGBA1C in the last 72 hours. CBG: Recent Labs  Lab 11/27/20 1748 11/28/20 0003 11/28/20 0613 11/28/20 1150 11/29/20 0016  GLUCAP 137* 187* 129* 112* 152*    Lipid Profile: No results for input(s): CHOL, HDL, LDLCALC, TRIG, CHOLHDL, LDLDIRECT in the last 72 hours. Thyroid Function Tests: No results for input(s): TSH, T4TOTAL, FREET4, T3FREE, THYROIDAB in the last 72 hours.  Anemia Panel: Recent Labs    11/28/20 1400   VITAMINB12 1,137*    Sepsis Labs: Recent Labs  Lab 11/24/20 1704 11/24/20 1904  LATICACIDVEN 2.4* 1.4     Recent Results (from the past 240 hour(s))  Culture, blood (Routine x 2)     Status: Abnormal   Collection Time: 11/24/20  5:04 PM   Specimen: BLOOD RIGHT ARM  Result Value Ref Range Status   Specimen Description   Final    BLOOD RIGHT ARM Performed at [facility], [place] [address] [place], [place] [address]    Special Requests   Final    BOTTLES DRAWN AEROBIC AND ANAEROBIC Blood Culture adequate volume Performed at [facility] [address], [place], [place] [address]    Culture  Setup Time   Final    GRAM NEGATIVE RODS IN BOTH AEROBIC AND ANAEROBIC BOTTLES Organism ID to follow CRITICAL RESULT CALLED TO, READ BACK BY AND VERIFIED WITH: PHARMD NATHAN BATCHELDER AT 1194 ON 11/25/20 [place] KJ    Culture (A)  Final    ENTEROBACTER ASBURIAE SUSCEPTIBILITIES PERFORMED ON PREVIOUS CULTURE WITHIN THE LAST 5 DAYS. Performed at [facility], [place] [address], [place], [place] [address]    Report Status 11/27/2020 FINAL  Final  Blood Culture ID Panel (Reflexed)     Status: Abnormal   Collection Time: 11/24/20  5:04 PM  Result Value Ref Range Status   Enterococcus faecalis NOT DETECTED NOT DETECTED Final   Enterococcus Faecium NOT DETECTED NOT DETECTED Final   Listeria monocytogenes NOT DETECTED NOT DETECTED Final   Staphylococcus species NOT DETECTED NOT DETECTED Final   Staphylococcus aureus (BCID) NOT DETECTED NOT DETECTED Final   Staphylococcus epidermidis NOT DETECTED NOT DETECTED Final   Staphylococcus lugdunensis NOT DETECTED NOT DETECTED Final   Streptococcus species NOT DETECTED NOT DETECTED Final   Streptococcus agalactiae NOT DETECTED NOT DETECTED  Final   Streptococcus pneumoniae NOT DETECTED NOT DETECTED Final   Streptococcus pyogenes NOT DETECTED NOT DETECTED Final   A.calcoaceticus-baumannii NOT DETECTED NOT DETECTED Final    Bacteroides fragilis NOT DETECTED NOT DETECTED Final   Enterobacterales DETECTED (A) NOT DETECTED Final    Comment: Enterobacterales represent a large order of gram negative bacteria, not a single organism. CRITICAL RESULT CALLED TO, READ BACK BY AND VERIFIED WITH: [place] AT 8657 ON 11/25/20 BY KJ    Enterobacter cloacae complex DETECTED (A) NOT DETECTED Final    Comment: CRITICAL RESULT CALLED TO, READ BACK BY AND VERIFIED WITH: [place] AT 1117 ON 11/25/20 BY KJ    Escherichia coli NOT DETECTED NOT DETECTED Final   Klebsiella aerogenes NOT DETECTED NOT DETECTED Final   Klebsiella oxytoca NOT DETECTED NOT DETECTED Final   Klebsiella pneumoniae NOT DETECTED NOT DETECTED Final   Proteus species NOT DETECTED NOT DETECTED Final   Salmonella species NOT DETECTED NOT DETECTED Final   Serratia marcescens NOT DETECTED NOT DETECTED Final   Haemophilus influenzae NOT DETECTED NOT DETECTED Final   Neisseria meningitidis NOT DETECTED NOT DETECTED Final   Pseudomonas aeruginosa NOT DETECTED NOT DETECTED Final   Stenotrophomonas maltophilia NOT DETECTED NOT DETECTED Final   Candida albicans NOT DETECTED NOT DETECTED Final   Candida auris NOT DETECTED NOT DETECTED Final   Candida glabrata NOT DETECTED NOT DETECTED Final   Candida krusei NOT DETECTED NOT DETECTED Final   Candida parapsilosis NOT DETECTED NOT DETECTED Final   Candida tropicalis NOT DETECTED NOT DETECTED Final   Cryptococcus neoformans/gattii NOT DETECTED NOT DETECTED Final   CTX-M ESBL NOT DETECTED NOT DETECTED Final   Carbapenem resistance IMP NOT DETECTED NOT DETECTED Final   Carbapenem resistance KPC NOT DETECTED NOT DETECTED Final   Carbapenem resistance NDM NOT DETECTED NOT DETECTED Final   Carbapenem resist OXA 48 LIKE NOT DETECTED NOT DETECTED Final   Carbapenem resistance VIM NOT DETECTED NOT DETECTED Final    Comment: Performed at [facility], [address] [place], [place]  [address]  Culture, blood (Routine x 2)     Status: Abnormal   Collection Time: 11/24/20  5:09 PM   Specimen: BLOOD LEFT ARM  Result Value Ref Range Status   Specimen Description   Final    BLOOD LEFT ARM Performed at [facility], [place] [address], [place], [place] [address]    Special Requests   Final    BOTTLES DRAWN AEROBIC AND ANAEROBIC Blood Culture adequate volume Performed at [place] [address], [place], [place] [address]    Culture  Setup Time   Final    GRAM POSITIVE COCCI GRAM NEGATIVE RODS Organism ID to follow IN BOTH AEROBIC AND ANAEROBIC BOTTLES CRITICAL RESULT CALLED TO, READ BACK BY AND VERIFIED WITH: PHARMD A.PHAN AT 1239 ON 11/25/2020 BY T.SAAD.    Culture (A)  Final    ENTEROBACTER ASBURIAE STAPHYLOCOCCUS EPIDERMIDIS THE SIGNIFICANCE OF ISOLATING THIS ORGANISM FROM A SINGLE SET OF BLOOD CULTURES WHEN MULTIPLE SETS ARE DRAWN IS UNCERTAIN. PLEASE NOTIFY THE MICROBIOLOGY DEPARTMENT WITHIN ONE WEEK IF SPECIATION AND SENSITIVITIES ARE REQUIRED. Performed at [facility], [place] [address], [place], [place] [address]    Report Status 11/27/2020 FINAL  Final   Organism ID, Bacteria ENTEROBACTER ASBURIAE  Final      Susceptibility   Enterobacter asburiae - MIC*    CEFAZOLIN >=64 RESISTANT Resistant     CEFEPIME <=0.12 SENSITIVE  Sensitive     CEFTAZIDIME <=1 SENSITIVE Sensitive     CEFTRIAXONE <=0.25 SENSITIVE Sensitive     CIPROFLOXACIN <=0.25 SENSITIVE Sensitive     GENTAMICIN <=1 SENSITIVE Sensitive     IMIPENEM <=0.25 SENSITIVE Sensitive     TRIMETH/SULFA <=20 SENSITIVE Sensitive     PIP/TAZO <=4 SENSITIVE Sensitive     * ENTEROBACTER ASBURIAE  Blood Culture ID Panel (Reflexed)     Status: Abnormal   Collection Time: 11/24/20  5:09 PM  Result Value Ref Range Status   Enterococcus faecalis NOT DETECTED NOT DETECTED Final   Enterococcus Faecium NOT DETECTED NOT DETECTED Final   Listeria monocytogenes NOT DETECTED NOT DETECTED  Final   Staphylococcus species DETECTED (A) NOT DETECTED Final    Comment: CRITICAL RESULT CALLED TO, READ BACK BY AND VERIFIED WITH: PHARMD A.PHAN AT 1239 ON 11/25/2020 BY T.SAAD.    Staphylococcus aureus (BCID) NOT DETECTED NOT DETECTED Final   Staphylococcus epidermidis DETECTED (A) NOT DETECTED Final    Comment: Methicillin (oxacillin) resistant coagulase negative staphylococcus. Possible blood culture contaminant (unless isolated from more than one blood culture draw or clinical case suggests pathogenicity). No antibiotic treatment is indicated for blood  culture contaminants. CRITICAL RESULT CALLED TO, READ BACK BY AND VERIFIED WITH: PHARMD A.PHAN AT 1239 ON 11/25/2020 BY T.SAAD.    Staphylococcus lugdunensis NOT DETECTED NOT DETECTED Final   Streptococcus species NOT DETECTED NOT DETECTED Final   Streptococcus agalactiae NOT DETECTED NOT DETECTED Final   Streptococcus pneumoniae NOT DETECTED NOT DETECTED Final   Streptococcus pyogenes NOT DETECTED NOT DETECTED Final   A.calcoaceticus-baumannii NOT DETECTED NOT DETECTED Final   Bacteroides fragilis NOT DETECTED NOT DETECTED Final   Enterobacterales DETECTED (A) NOT DETECTED Final    Comment: Enterobacterales represent a large order of gram negative bacteria, not a single organism. CRITICAL RESULT CALLED TO, READ BACK BY AND VERIFIED WITH: PHARMD A.PHAN AT 1239 ON 11/25/2020 BY T.SAAD.    Enterobacter cloacae complex DETECTED (A) NOT DETECTED Final    Comment: CRITICAL RESULT CALLED TO, READ BACK BY AND VERIFIED WITH: PHARMD A.PHAN AT 1239 ON 11/25/2020 BY T.SAAD.    Escherichia coli NOT DETECTED NOT DETECTED Final   Klebsiella aerogenes NOT DETECTED NOT DETECTED Final   Klebsiella oxytoca NOT DETECTED NOT DETECTED Final   Klebsiella pneumoniae NOT DETECTED NOT DETECTED Final   Proteus species NOT DETECTED NOT DETECTED Final   Salmonella species NOT DETECTED NOT DETECTED Final   Serratia marcescens NOT DETECTED NOT DETECTED  Final   Haemophilus influenzae NOT DETECTED NOT DETECTED Final   Neisseria meningitidis NOT DETECTED NOT DETECTED Final   Pseudomonas aeruginosa NOT DETECTED NOT DETECTED Final   Stenotrophomonas maltophilia NOT DETECTED NOT DETECTED Final   Candida albicans NOT DETECTED NOT DETECTED Final   Candida auris NOT DETECTED NOT DETECTED Final   Candida glabrata NOT DETECTED NOT DETECTED Final   Candida krusei NOT DETECTED NOT DETECTED Final   Candida parapsilosis NOT DETECTED NOT DETECTED Final   Candida tropicalis NOT DETECTED NOT DETECTED Final   Cryptococcus neoformans/gattii NOT DETECTED NOT DETECTED Final   CTX-M ESBL NOT DETECTED NOT DETECTED Final   Carbapenem resistance IMP NOT DETECTED NOT DETECTED Final   Carbapenem resistance KPC NOT DETECTED NOT DETECTED Final   Methicillin resistance mecA/C DETECTED (A) NOT DETECTED Final    Comment: CRITICAL RESULT CALLED TO, READ BACK BY AND VERIFIED WITH: PHARMD A.PHAN AT 1239 ON 11/25/2020 BY T.SAAD.    Carbapenem resistance NDM NOT DETECTED NOT DETECTED Final  Carbapenem resist OXA 48 LIKE NOT DETECTED NOT DETECTED Final   Carbapenem resistance VIM NOT DETECTED NOT DETECTED Final    Comment: Performed at [facility], [place] [address] [place], [place] [address]  Resp Panel by RT-PCR (Flu A&B, Covid) Nasopharyngeal Swab     Status: None   Collection Time: 11/24/20  5:12 PM   Specimen: Nasopharyngeal Swab; Nasopharyngeal(NP) swabs in vial transport medium  Result Value Ref Range Status   SARS Coronavirus 2 by RT PCR NEGATIVE NEGATIVE Final    Comment: (NOTE) SARS-CoV-2 target nucleic acids are NOT DETECTED.  The SARS-CoV-2 RNA is generally detectable in upper respiratory specimens during the acute phase of infection. The lowest concentration of SARS-CoV-2 viral copies this assay can detect is 138 copies/mL. A negative result does not preclude SARS-Cov-2 infection and should not be used as the sole basis for treatment or other  patient management decisions. A negative result may occur with  improper specimen collection/handling, submission of specimen other than nasopharyngeal swab, presence of viral mutation(s) within the areas targeted by this assay, and inadequate number of viral copies(<138 copies/mL). A negative result must be combined with clinical observations, patient history, and epidemiological information. The expected result is Negative.  Fact Sheet for Patients:  EntrepreneurPulse.com.au  Fact Sheet for Healthcare Providers:  IncredibleEmployment.be  This test is no t yet approved or cleared by the [place] FDA and  has been authorized for detection and/or diagnosis of SARS-CoV-2 by FDA under an Emergency Use Authorization (EUA). This EUA will remain  in effect (meaning this test can be used) for the duration of the COVID-19 declaration under Section 564(b)(1) of the Act, 21 U.S.C.section 360bbb-3(b)(1), unless the authorization is terminated  or revoked sooner.       Influenza A by PCR NEGATIVE NEGATIVE Final   Influenza B by PCR NEGATIVE NEGATIVE Final    Comment: (NOTE) The Xpert Xpress SARS-CoV-2/FLU/RSV plus assay is intended as an aid in the diagnosis of influenza from Nasopharyngeal swab specimens and should not be used as a sole basis for treatment. Nasal washings and aspirates are unacceptable for Xpert Xpress SARS-CoV-2/FLU/RSV testing.  Fact Sheet for Patients: EntrepreneurPulse.com.au  Fact Sheet for Healthcare Providers: IncredibleEmployment.be  This test is not yet approved or cleared by the [place] FDA and has been authorized for detection and/or diagnosis of SARS-CoV-2 by FDA under an Emergency Use Authorization (EUA). This EUA will remain in effect (meaning this test can be used) for the duration of the COVID-19 declaration under Section 564(b)(1) of the Act, 21 U.S.C. section  360bbb-3(b)(1), unless the authorization is terminated or revoked.  Performed at [facility], [place] [address], [place], [place] [address]   Urine culture     Status: Abnormal   Collection Time: 11/24/20  8:30 PM   Specimen: In/Out Cath Urine  Result Value Ref Range Status   Specimen Description   Final    IN/OUT CATH URINE Performed at [facility] [address] [place], [place] [address]    Special Requests   Final    NONE Performed at [facility], [place] [address], [place], [place] [address]    Culture 60,000 COLONIES/mL ENTEROBACTER SPECIES (A)  Final   Report Status 11/27/2020 FINAL  Final   Organism ID, Bacteria ENTEROBACTER SPECIES (A)  Final      Susceptibility   Enterobacter species - MIC*    CEFAZOLIN >=64 RESISTANT Resistant     CEFEPIME <=0.12 SENSITIVE Sensitive  CEFTRIAXONE <=0.25 SENSITIVE Sensitive     CIPROFLOXACIN <=0.25 SENSITIVE Sensitive     GENTAMICIN <=1 SENSITIVE Sensitive     IMIPENEM <=0.25 SENSITIVE Sensitive     NITROFURANTOIN <=16 SENSITIVE Sensitive     TRIMETH/SULFA <=20 SENSITIVE Sensitive     PIP/TAZO <=4 SENSITIVE Sensitive     * 60,000 COLONIES/mL ENTEROBACTER SPECIES  Culture, blood (routine x 2)     Status: None (Preliminary result)   Collection Time: 11/27/20  5:37 AM   Specimen: BLOOD LEFT ARM  Result Value Ref Range Status   Specimen Description   Final    BLOOD LEFT ARM Performed at [facility] [place] [address] [place], [place] [address]    Special Requests   Final    BOTTLES DRAWN AEROBIC ONLY Blood Culture adequate volume Performed at [facility] [place] [address] [place], [place] [address]    Culture   Final    NO GROWTH < 24 HOURS Performed at [facility] [address] [place], [place] [address]    Report Status PENDING  Incomplete  Culture, blood (routine x 2)     Status: None (Preliminary result)   Collection  Time: 11/27/20  5:37 AM   Specimen: BLOOD  Result Value Ref Range Status   Specimen Description   Final    BLOOD LEFT WRIST Performed at [place] [address], [place], [place] [address]    Special Requests   Final    BOTTLES DRAWN AEROBIC ONLY Blood Culture adequate volume Performed at [place] [address], [place], [place] [address]    Culture   Final    NO GROWTH < 24 HOURS Performed at [facility] [address] [place],  [address]    Report Status PENDING  Incomplete          Radiology Studies: DG CHEST PORT 1 VIEW  Result Date: 11/28/2020 CLINICAL DATA:  87 year old male with weakness and altered mental status. EXAM: PORTABLE CHEST 1 VIEW COMPARISON:  Portable chest 11/26/2020 and earlier. FINDINGS: Portable AP semi upright view at 1033 hours. Stable low lung volumes and mediastinal contours. Tortuous thoracic aorta. Visualized tracheal air column is within normal limits. Mild lung base atelectasis. Otherwise Allowing for portable technique the lungs are clear. No pneumothorax. Oral contrast retained in nondilated large bowel in the upper abdomen. No acute osseous abnormality identified. IMPRESSION: Low lung volumes with mild atelectasis. Electronically Signed   By: Genevie Ann M.D.   On: 11/28/2020 10:56   ECHOCARDIOGRAM COMPLETE  Result Date: 11/28/2020    ECHOCARDIOGRAM REPORT   Patient Name:   [name] Date of Exam: 11/28/2020 Medical Rec #:  [id number]    Height:       66.0 in Accession #:    [id number]   Weight:       144.4 lb Date of Birth:  1918-07-20    BSA:          1.741 m Patient Age:    24 years     BP:           166/84 mmHg Patient Gender: M            HR:           112 bpm. Exam Location:  Inpatient Procedure: 2D Echo, Cardiac Doppler and Color Doppler Indications:    R94.31 Abnormal EKG  History:        Patient has prior history  of Echocardiogram examinations, most                 recent 05/23/2012.   Sonographer:    Merrie Roof RDCS Referring Phys: [id number] [place]  1. Left ventricular ejection fraction, by estimation, is 60 to 65%. The left ventricle has normal function. The left ventricle has no regional wall motion abnormalities. There is moderate left ventricular hypertrophy. Left ventricular diastolic function  could not be evaluated.  2. Right ventricular systolic function is normal. The right ventricular size is normal.  3. The mitral valve is normal in structure. Trivial mitral valve regurgitation.  4. Tricuspid valve regurgitation is moderate.  5. The aortic valve is grossly normal. Aortic valve regurgitation is not visualized. No aortic stenosis is present. FINDINGS  Left Ventricle: Left ventricular ejection fraction, by estimation, is 60 to 65%. The left ventricle has normal function. The left ventricle has no regional wall motion abnormalities. The left ventricular internal cavity size was normal in size. There is  moderate left ventricular hypertrophy. Left ventricular diastolic function could not be evaluated due to atrial fibrillation. Left ventricular diastolic function could not be evaluated. Right Ventricle: The right ventricular size is normal. No increase in right ventricular wall thickness. Right ventricular systolic function is normal. Left Atrium: Left atrial size was normal in size. Right Atrium: Right atrial size was normal in size. Pericardium: There is no evidence of pericardial effusion. Mitral Valve: The mitral valve is normal in structure. Trivial mitral valve regurgitation. Tricuspid Valve: The tricuspid valve is grossly normal. Tricuspid valve regurgitation is moderate. Aortic Valve: The aortic valve is grossly normal. Aortic valve regurgitation is not visualized. No aortic stenosis is present. Aortic valve mean gradient measures 4.0 mmHg. Aortic valve peak gradient measures 7.3 mmHg. Aortic valve area, by VTI measures 2.35 cm. Pulmonic Valve: The pulmonic  valve was grossly normal. Pulmonic valve regurgitation is not visualized. Aorta: The aortic root and ascending aorta are structurally normal, with no evidence of dilitation. IAS/Shunts: The atrial septum is grossly normal.  LEFT VENTRICLE PLAX 2D LVIDd:         3.50 cm LVIDs:         2.30 cm LV PW:         1.40 cm LV IVS:        1.40 cm LVOT diam:     1.70 cm LV SV:         37 LV SV Index:   21 LVOT Area:     2.27 cm  RIGHT VENTRICLE          IVC RV Basal diam:  3.60 cm  IVC diam: 2.10 cm LEFT ATRIUM           Index       RIGHT ATRIUM           Index LA diam:      3.30 cm 1.90 cm/m  RA Area:     16.20 cm LA Vol (A2C): 48.7 ml 27.97 ml/m RA Volume:   33.20 ml  19.07 ml/m LA Vol (A4C): 44.9 ml 25.79 ml/m  AORTIC VALVE AV Area (Vmax):    2.36 cm AV Area (Vmean):   2.36 cm AV Area (VTI):     2.35 cm AV Vmax:           135.50 cm/s AV Vmean:          93.550 cm/s AV VTI:            0.158  m AV Peak Grad:      7.3 mmHg AV Mean Grad:      4.0 mmHg LVOT Vmax:         141.00 cm/s LVOT Vmean:        97.300 cm/s LVOT VTI:          0.164 m LVOT/AV VTI ratio: 1.03  AORTA Ao Root diam: 3.20 cm Ao Asc diam:  3.30 cm TRICUSPID VALVE TR Peak grad:   49.6 mmHg TR Vmax:        352.00 cm/s  SHUNTS Systemic VTI:  0.16 m Systemic Diam: 1.70 cm Mertie Moores MD Electronically signed by Mertie Moores MD Signature Date/Time: 11/28/2020/5:28:44 PM    Final         Scheduled Meds:  amLODipine  5 mg Oral Daily   Carbidopa-Levodopa ER  1.5 tablet Oral TID   feeding supplement  237 mL Oral BID BM   Continuous Infusions:     LOS: 5 days    Time spent: 35 minutes.     Elmarie Shiley, MD [facility]   If 7PM-7AM, please contact night-coverage [email or web address]  11/29/2020, 12:29 PM

## 2020-11-29 NOTE — Progress Notes (Signed)
Daily Progress Note   Patient Name: Jourdon Zimmerle       Date: 11/29/2020 DOB: 03-Jul-1918  Age: 87 y.o. MRN#: [id number] Attending Physician: Elmarie Shiley, MD Primary Care Physician: Vernie Shanks, MD Admit Date: 11/24/2020  Reason for Consultation/Follow-up: Establishing goals of care  Subjective: I met today with Mr. Stanbery and his family on 2 occasions.  During my first encounter, we discussed his clinical course including new concern with worsening respiratory status and his overall frailty.  We had another long discussion regarding hopes for his care moving forward and his wife expressed that he would not find his current situation to be quality of life that he would find acceptable.  Following this discussion, family expressed that they think that he may be best served by seeing if he is a candidate for [facility] moving forward.  Concern is that is likely functional status, if he were to survive current hospitalization, would be very diminished from prior quality of life that he enjoys.  His wife expressed very clearly that he would not want to be weak and bedbound and dependent upon others for care.  Up until recently, they were walking several blocks to church and this high functional status is something that he values very much.  If he is going to be debilitated, she feels that he would be very clear in saying he has limited life and he would rather allow a natural death and continue to fight only to be stuck in bed and dependent upon others for his care.  I reached out to liaison for [facility] and we met again with family to discuss [facility] and the care they offer.  Discussed that currently, he may not be candidate for [facility], however, he has a very high risk of  decompensation over the next couple of days with a focus on comfort moving forward.  We discussed plan to transition to focus on comfort and continue to monitor his clinical course through the weekend.  If he continues to decline, will pursue [facility] for end-of-life care.  Length of Stay: 5  Current Medications: Scheduled Meds:  . amLODipine  5 mg Oral Daily  . Carbidopa-Levodopa ER  1.5 tablet Oral TID  . feeding supplement  237 mL Oral BID BM    Continuous Infusions:  PRN Meds: acetaminophen **OR** acetaminophen, albuterol, antiseptic oral rinse, glycopyrrolate **OR** glycopyrrolate **OR** glycopyrrolate, haloperidol **OR** haloperidol **OR** haloperidol lactate, HYDROmorphone (DILAUDID) injection, ondansetron **OR** ondansetron (ZOFRAN) IV, polyvinyl alcohol  Physical Exam      General: Frail and weak appearing HEENT: No bruits, no goiter, no JVD Heart: Regular rate and rhythm. No murmur appreciated. Lungs: Good air movement, clear Abdomen: Soft, nontender, nondistended, positive bowel sounds.   Ext: No significant edema.  Muscle wasting noted Skin: Warm and dry  Vital Signs: BP (!) 177/82 (BP Location: Left Arm)   Pulse 75   Temp 98 F (36.7 C) (Oral)   Resp 18   Ht _0  (1.676 m)   Wt 67.4 kg   SpO2 93%   BMI 23.98 kg/m  SpO2: SpO2: 93 % O2 Device: O2 Device: Room Air O2 Flow Rate: O2 Flow Rate (L/min): 2 L/min  Intake/output summary: No intake or output data in the 24 hours ending 11/29/20 0800 LBM: Last BM Date: 11/28/20 Baseline Weight: Weight: 65.8 kg Most recent weight: Weight: 67.4 kg       Palliative Assessment/Data:    Flowsheet Rows    Flowsheet Row Most Recent Value  Intake Tab   Referral Department Hospitalist  Unit at Time of Referral Med/Surg Unit  Palliative Care Primary Diagnosis Sepsis/Infectious Disease  Date Notified 11/25/20  Palliative Care Type New Palliative care  Reason for referral Clarify Goals of Care  Date of  Admission 11/24/20  Date first seen by Palliative Care 11/26/20  # of days Palliative referral response time 1 Day(s)  # of days IP prior to Palliative referral 1  Clinical Assessment   Palliative Performance Scale Score 30%  Psychosocial & Spiritual Assessment   Palliative Care Outcomes   Patient/Family meeting held? Yes       Patient Active Problem List   Diagnosis Date Noted  . Acute cystitis 11/25/2020  . Acute metabolic encephalopathy 16/03/9603  . Lactic acidosis 11/25/2020  . Acute kidney injury superimposed on CKD ([place]) 11/25/2020  . Generalized weakness 11/25/2020  . Acute hyponatremia 11/25/2020  . Severe sepsis ([place]) 11/24/2020  . Sialorrhea 01/28/2016  . Memory disorder 01/28/2016  . Parkinson disease ([place]) 10/23/2015  . Gait disorder 02/24/2014  . Edema 09/28/2011  . Bronchospasm 08/18/2011  . Annual physical exam 03/31/2011  . RASH-NONVESICULAR 06/09/2010  . Anemia 12/30/2009  . DM2 (diabetes mellitus, type 2) ([place]) 09/01/2009  . ALLERGIC RHINITIS 08/20/2009  . NEOPLASM, MALIGNANT, SKIN 08/30/2007  . ADENOCARCINOMA, PROSTATE, HX OF 03/21/2007  . HLD (hyperlipidemia) 09/29/2006  . GOUT 09/29/2006  . HIATAL HERNIA 09/29/2006    Palliative Care Assessment & Plan   Patient Profile: Mr. Brathwaite is a 87 year old male with past medical history of Parkinson's disease, prostate cancer status post prostatectomy, CKD stage III who had a fall at home and was brought to the hospital.  He is very functional at baseline and they walk 7 blocks daily.  He is currently admitted with bacteremia secondary to UTI.  Palliative consulted for goals of care.  Recommendations/Plan: Plan to transition to a focus on comfort including stopping of IV fluids and antibiotics.  We will continue to monitor his clinical course closely over the next 48 hours.  If he continues to decompensate, which I believe is the most likely scenario, family would like to work to transition to [facility]  for end-of-life care.  Goals of Care and Additional Recommendations: Limitations on Scope of Treatment: Full Comfort Care  Code Status:  Code Status Orders  (From admission, onward)           Start     Ordered   11/28/20 1533  Do not attempt resuscitation (DNR)  Continuous       Question Answer Comment  In the event of cardiac or respiratory ARREST Do not call a "code blue"   In the event of cardiac or respiratory ARREST Do not perform Intubation, CPR, defibrillation or ACLS   In the event of cardiac or respiratory ARREST Use medication by any route, position, wound care, and other measures to relive pain and suffering. May use oxygen, suction and manual treatment of airway obstruction as needed for comfort.      11/28/20 1535           Code Status History     Date Active Date Inactive Code Status Order ID Comments User Context   11/27/2020 1214 11/28/2020 1535 DNR [id number]  Micheline Rough, MD Inpatient   11/24/2020 2130 11/27/2020 1213 Full Code [id number]  Howerter, Ethelda Chick, DO ED      Advance Directive Documentation    Flowsheet Row Most Recent Value  Type of Advance Directive Healthcare Power of Attorney, Living will  Pre-existing out of facility DNR order (yellow form or pink MOST form) --  "MOST" Form in Place? --       Prognosis: We are working today to transition to comfort based care.  I believe there is a high likelihood he will continue to decompensate.  If this occurs, I believe his prognosis to be less than 2 weeks and he would likely be well served by residential hospice.  Discharge Planning: To Be Determined-family reports preference for [facility] if appropriate  Care plan was discussed with son, daughter, wife  Thank you for allowing the Palliative Medicine Team to assist in the care of this patient.   Time In: 1130 Time Out: 1220 Total Time 50 Prolonged Time Billed No      Greater than 50%  of this time was spent counseling and  coordinating care related to the above assessment and plan.  Micheline Rough, MD  Please contact Palliative Medicine Team phone at [phone] for questions and concerns.

## 2020-11-30 DIAGNOSIS — Z7189 Other specified counseling: Secondary | ICD-10-CM | POA: Diagnosis not present

## 2020-11-30 DIAGNOSIS — A419 Sepsis, unspecified organism: Secondary | ICD-10-CM | POA: Diagnosis not present

## 2020-11-30 DIAGNOSIS — R6520 Severe sepsis without septic shock: Secondary | ICD-10-CM | POA: Diagnosis not present

## 2020-11-30 DIAGNOSIS — Z515 Encounter for palliative care: Secondary | ICD-10-CM | POA: Diagnosis not present

## 2020-11-30 DIAGNOSIS — N3001 Acute cystitis with hematuria: Secondary | ICD-10-CM | POA: Diagnosis not present

## 2020-11-30 LAB — BASIC METABOLIC PANEL
Anion gap: 5 (ref 5–15)
BUN: 48 mg/dL — ABNORMAL HIGH (ref 8–23)
CO2: 27 mmol/L (ref 22–32)
Calcium: 8.2 mg/dL — ABNORMAL LOW (ref 8.9–10.3)
Chloride: 111 mmol/L (ref 98–111)
Creatinine, Ser: 1.32 mg/dL — ABNORMAL HIGH (ref 0.61–1.24)
GFR, Estimated: 48 mL/min — ABNORMAL LOW (ref >60)
Glucose, Bld: 135 mg/dL — ABNORMAL HIGH (ref 70–99)
Potassium: 3.7 mmol/L (ref 3.5–5.1)
Sodium: 143 mmol/L (ref 135–145)

## 2020-11-30 LAB — CBC
HCT: 25.9 % — ABNORMAL LOW (ref 39.0–52.0)
Hemoglobin: 8.7 g/dL — ABNORMAL LOW (ref 13.0–17.0)
MCH: 33.3 pg (ref 26.0–34.0)
MCHC: 33.6 g/dL (ref 30.0–36.0)
MCV: 99.2 fL (ref 80.0–100.0)
Platelets: 147 10*3/uL — ABNORMAL LOW (ref 150–400)
RBC: 2.61 MIL/uL — ABNORMAL LOW (ref 4.22–5.81)
RDW: 15 % (ref 11.5–15.5)
WBC: 10.3 10*3/uL (ref 4.0–10.5)
nRBC: 0 % (ref 0.0–0.2)

## 2020-11-30 NOTE — Progress Notes (Signed)
Patient's family at bedside all day. They had a meeting with Dr. Domingo Cocking this afternoon.

## 2020-11-30 NOTE — Progress Notes (Signed)
[facility] [id number] [facility] Liaison RN note  Asked by Dr. Domingo Cocking and Suanne Marker with [facility] to meet at bedside with Dr. Domingo Cocking and family to discuss hospice services on Friday 6.17.22.  After discussion, plan was to transition patient to comfort care and continue to assess through the weekend.  Visited with patient, wife and son in the room today. Patient is more alert and interactive, requesting to get out of bed and go for a walk. Family reports that he ate a very good breakfast, all of his eggs, did not care for the french toast as it was too dry.  Family is questioning if hospice and comfort focused care is correct path with the change in patient condition. Requesting PT evaluation. Discussed with Dr. Domingo Cocking who is planning to meet with family later today.  Will continue to follow and engage as needed for hospice or outpatient palliative care services.  Please call with any questions or concerns.  Thank you. Margaretmary Eddy, RN, BSN [facility] Liaison [phone]

## 2020-11-30 NOTE — Progress Notes (Signed)
PROGRESS NOTE    [name]  ENI:[id number] DOB: 26-Oct-1918 DOA: 11/24/2020 PCP: Vernie Shanks, MD   Brief Narrative: 87 year old with past medical history significant for Parkinson's disease, prostate cancer, history of prostatectomy, CKD stage IIIa, baseline creatinine around 1.4, who presents status post fall at home and wife called EMS and brought him to the hospital. At baseline patient was able to walk 7 blocks daily.  She was concerned as patient was becoming more and more weak, generalized weakness, altered mental status, increased somnolence.  Denies history of fever, chills, cough, nausea, vomiting, diarrhea.  She also noticed his oral intake was poor.   Assessment & Plan:   Principal Problem:   Severe sepsis ([place]) Active Problems:   DM2 (diabetes mellitus, type 2) (HCC)   HLD (hyperlipidemia)   Anemia   Parkinson disease ([place])   Acute cystitis   Acute metabolic encephalopathy   Lactic acidosis   Acute kidney injury superimposed on CKD ([place])   Generalized weakness   Acute hyponatremia  1-Enterobacter Asburiae Bacteremia likely secondary to UTI, Sepsis:  -Patient presents  with fever,  Temperature 102, Tachypnea, Respiration rate 36, leukocytosis white blood cell 15, confusion, thrombocytopenia. -Blood Cultures: Positive for Enterobacter. -Treated with cefepime from 6/13 until 6/17. Antibiotics discontinue because patient was transition to comfort care.  -Imipenem started 6/18, avoid cefepime to avoid neurotoxicity.  -Repeated Blood culture 6/16 no growth to date.  -Plan to discharge on Bactrim, now that renal function has improved.    2-Acute metabolic encephalopathy, secondary to sepsis/GNR bacteremia; -CT head: No acute intracranial process. -Patient was alert on  my evaluation. He was able to answer questions, he feels very weak, so weak that he can barely speak -Infectious process -TSH was normal, ammonia level was negative.  -He is more alert, per family he  is making more sense.  -He is asking to be out of bed. Will Get PT, to see how patient perform.   3-Failure to Thrive:  Suspect this is related to sepsis on top of underlying Parkinson's disease and fragility/  I am concern  with his ability of regaining his strength after his hospitalization.  -TSH was normal, Ammonia normal, Mg Normal.  - Cortisol level and B 12 level. Not low.  -Awaiting PT evaluation.   4-A fib RVR;  Secondary to fever and rigors 6/14. ECHO Normal EF.  Decision was made to hold on anticoagulation due to thrombocytopenia and advance age.  Started low dose metoprolol.   5-Acute Hypoxic Respiratory failure;  Suspect related to hypoventilation, question if aspiration.  Concern for sleep apnea. Might need oxygen while sleep.  Nebulizer treatments order PRN.   6-Elevated troponin;  Demand ischemia related to sepsis.  Troponin 63--75--38  7-Mild Hyponatremia; Received IV fluids.  DM type 2: HbA1c 5.2.  One out of two Blood culture positive for Staph epidermidis; Likely Contaminate.  Repeated blood culture no growth to date.   Parkinson Diseases:  Continue with Levodopa and carbidopa.   AKI on CKD stage a;  Improved with IV fluids. Cr peak to 2.0 Down to 1.3, baseline.   HTN; Continue with Norvasc and metoprolol.   HLD; on statins.   Dysphagia; on Dysphagia 3 diet.   Anemia; started iron supplement.  Thrombocytopenia; in setting of infectious process.   Goals of care.  Plan for PT evaluation, to determine disposition, Home with Hospice vs SNF  Nutrition Problem: Inadequate oral intake Etiology: lethargy/confusion    Signs/Symptoms: per patient/family report  Interventions: Ensure Enlive (each supplement provides 350kcal and 20 grams of protein), Magic cup  Estimated body mass index is 23.73 kg/m as calculated from the following:   Height as of this encounter: 5\' 6"  (1.676 m).   Weight as of this encounter: 66.7 kg.   DVT prophylaxis:  SCD Code Status: DNR Family Communication: Son who was at bedside.  Disposition Plan:  Status is: Inpatient  Remains inpatient appropriate because:IV treatments appropriate due to intensity of illness or inability to take PO  Dispo: The patient is from: Home              Anticipated d/c is to:  to be determine              Patient currently is not medically stable to d/c.   Difficult to place patient No        Consultants:  Palliative care  Procedures:  ECHO Normal EF  Antimicrobials:    Subjective: He is alert, answer questions appropriately. Denies dyspnea or pain. He is following commands.    Objective: Vitals:   11/29/20 2026 11/30/20 0621 11/30/20 1116 11/30/20 1117  BP: (!) 187/88 (!) 193/80 (!) 155/76 (!) 155/76  Pulse: 84 75    Resp: (!) 22 17    Temp: 98.7 F (37.1 C) 97.9 F (36.6 C)    TempSrc: Oral Oral    SpO2: 97% 97%    Weight:  66.7 kg    Height:        Intake/Output Summary (Last 24 hours) at 11/30/2020 1234 Last data filed at 11/30/2020 1058 Gross per 24 hour  Intake 1060.94 ml  Output 1250 ml  Net -189.06 ml    Filed Weights   11/28/20 0600 11/29/20 0437 11/30/20 0621  Weight: 65.5 kg 67.4 kg 66.7 kg    Examination:  General exam: Alert follows command.  Respiratory system: Less wheezing.  Cardiovascular system: S 1, S 2  RRR Gastrointestinal system: BS present, soft, nt Central nervous system: Alert, follows command, moves upper extremities 4/5, lower extremities BL weakness left more weak than right.  Extremities: trace edema   Data Reviewed: I have personally reviewed following labs and imaging studies  CBC: Recent Labs  Lab 11/24/20 1704 11/25/20 0230 11/25/20 0454 11/25/20 1138 11/25/20 1457 11/27/20 0537 11/28/20 0539 11/30/20 0643  WBC 11.4*  --  10.1  --  15.0* 6.2 6.9 10.3  NEUTROABS 10.3*  --  9.1*  --   --  5.5 5.6  --   HGB 8.6*   < > 8.1* 9.1* 10.2* 8.0* 7.9* 8.7*  HCT 25.5*   < > 24.8* 27.4* 30.8*  24.3* 24.4* 25.9*  MCV 99.2  --  99.6  --  99.4 100.4* 100.8* 99.2  PLT 124*  --  104*  --  128* 111* 107* 147*   < > = values in this interval not displayed.    Basic Metabolic Panel: Recent Labs  Lab 11/24/20 1704 11/25/20 0454 11/25/20 1457 11/26/20 2034 11/27/20 0537 11/28/20 0539 11/30/20 0643  NA 133* 134* 135  --  139 142 143  K 4.5 4.0 4.5  --  3.6 3.6 3.7  CL 102 102 104  --  109 111 111  CO2 25 24 20*  --  22 24 27   GLUCOSE 111* 97 80  --  127* 138* 135*  BUN 37* 39* 42*  --  61* 55* 48*  CREATININE 2.13* 1.80* 1.86*  --  1.68* 1.66* 1.32*  CALCIUM 8.0* 7.7*  8.0*  --  7.8* 7.8* 8.2*  MG 2.1 2.0  --   --  2.3 2.4  --   PHOS  --   --   --  3.8 3.6  --   --     GFR: Estimated Creatinine Clearance: 27.5 mL/min (A) (by C-G formula based on SCr of 1.32 mg/dL (H)). Liver Function Tests: Recent Labs  Lab 11/24/20 1704 11/25/20 0454 11/25/20 1457 11/27/20 0537 11/28/20 0539  AST 36 40 46* 22 25  ALT 5 8 14 18  <5  ALKPHOS 57 48 61 52 50  BILITOT 1.1 0.9 1.0 0.6 0.3  PROT 6.3* 5.7* 6.5 5.7* 5.4*  ALBUMIN 3.2* 2.6* 2.8* 2.3* 2.0*    No results for input(s): LIPASE, AMYLASE in the last 168 hours. Recent Labs  Lab 11/25/20 0455  AMMONIA 11    Coagulation Profile: Recent Labs  Lab 11/24/20 1704 11/25/20 0454  INR 1.1 1.4*    Cardiac Enzymes: No results for input(s): CKTOTAL, CKMB, CKMBINDEX, TROPONINI in the last 168 hours. BNP (last 3 results) No results for input(s): PROBNP in the last 8760 hours. HbA1C: No results for input(s): HGBA1C in the last 72 hours. CBG: Recent Labs  Lab 11/27/20 1748 11/28/20 0003 11/28/20 0613 11/28/20 1150 11/29/20 0016  GLUCAP 137* 187* 129* 112* 152*    Lipid Profile: No results for input(s): CHOL, HDL, LDLCALC, TRIG, CHOLHDL, LDLDIRECT in the last 72 hours. Thyroid Function Tests: No results for input(s): TSH, T4TOTAL, FREET4, T3FREE, THYROIDAB in the last 72 hours.  Anemia Panel: Recent Labs     11/28/20 1400  VITAMINB12 1,137*    Sepsis Labs: Recent Labs  Lab 11/24/20 1704 11/24/20 1904  LATICACIDVEN 2.4* 1.4     Recent Results (from the past 240 hour(s))  Culture, blood (Routine x 2)     Status: Abnormal   Collection Time: 11/24/20  5:04 PM   Specimen: BLOOD RIGHT ARM  Result Value Ref Range Status   Specimen Description   Final    BLOOD RIGHT ARM Performed at [facility], [place] [address], [place], [place] [address]    Special Requests   Final    BOTTLES DRAWN AEROBIC AND ANAEROBIC Blood Culture adequate volume Performed at [place] [facility] [address], [place], [place] [address]    Culture  Setup Time   Final    GRAM NEGATIVE RODS IN BOTH AEROBIC AND ANAEROBIC BOTTLES Organism ID to follow CRITICAL RESULT CALLED TO, READ BACK BY AND VERIFIED WITH: PHARMD NATHAN BATCHELDER AT 9485 ON 11/25/20 [place] KJ    Culture (A)  Final    ENTEROBACTER ASBURIAE SUSCEPTIBILITIES PERFORMED ON PREVIOUS CULTURE WITHIN THE LAST 5 DAYS. Performed at [facility], [place] [address] [place], [place] [address]    Report Status 11/27/2020 FINAL  Final  Blood Culture ID Panel (Reflexed)     Status: Abnormal   Collection Time: 11/24/20  5:04 PM  Result Value Ref Range Status   Enterococcus faecalis NOT DETECTED NOT DETECTED Final   Enterococcus Faecium NOT DETECTED NOT DETECTED Final   Listeria monocytogenes NOT DETECTED NOT DETECTED Final   Staphylococcus species NOT DETECTED NOT DETECTED Final   Staphylococcus aureus (BCID) NOT DETECTED NOT DETECTED Final   Staphylococcus epidermidis NOT DETECTED NOT DETECTED Final   Staphylococcus lugdunensis NOT DETECTED NOT DETECTED Final   Streptococcus species NOT DETECTED NOT DETECTED Final   Streptococcus agalactiae NOT DETECTED NOT DETECTED Final   Streptococcus pneumoniae NOT DETECTED NOT DETECTED  Final   Streptococcus pyogenes NOT DETECTED NOT DETECTED Final   A.calcoaceticus-baumannii NOT DETECTED NOT  DETECTED Final   Bacteroides fragilis NOT DETECTED NOT DETECTED Final   Enterobacterales DETECTED (A) NOT DETECTED Final    Comment: Enterobacterales represent a large order of gram negative bacteria, not a single organism. CRITICAL RESULT CALLED TO, READ BACK BY AND VERIFIED WITH: [place] AT 2025 ON 11/25/20 BY KJ    Enterobacter cloacae complex DETECTED (A) NOT DETECTED Final    Comment: CRITICAL RESULT CALLED TO, READ BACK BY AND VERIFIED WITH: [place] AT 1117 ON 11/25/20 BY KJ    Escherichia coli NOT DETECTED NOT DETECTED Final   Klebsiella aerogenes NOT DETECTED NOT DETECTED Final   Klebsiella oxytoca NOT DETECTED NOT DETECTED Final   Klebsiella pneumoniae NOT DETECTED NOT DETECTED Final   Proteus species NOT DETECTED NOT DETECTED Final   Salmonella species NOT DETECTED NOT DETECTED Final   Serratia marcescens NOT DETECTED NOT DETECTED Final   Haemophilus influenzae NOT DETECTED NOT DETECTED Final   Neisseria meningitidis NOT DETECTED NOT DETECTED Final   Pseudomonas aeruginosa NOT DETECTED NOT DETECTED Final   Stenotrophomonas maltophilia NOT DETECTED NOT DETECTED Final   Candida albicans NOT DETECTED NOT DETECTED Final   Candida auris NOT DETECTED NOT DETECTED Final   Candida glabrata NOT DETECTED NOT DETECTED Final   Candida krusei NOT DETECTED NOT DETECTED Final   Candida parapsilosis NOT DETECTED NOT DETECTED Final   Candida tropicalis NOT DETECTED NOT DETECTED Final   Cryptococcus neoformans/gattii NOT DETECTED NOT DETECTED Final   CTX-M ESBL NOT DETECTED NOT DETECTED Final   Carbapenem resistance IMP NOT DETECTED NOT DETECTED Final   Carbapenem resistance KPC NOT DETECTED NOT DETECTED Final   Carbapenem resistance NDM NOT DETECTED NOT DETECTED Final   Carbapenem resist OXA 48 LIKE NOT DETECTED NOT DETECTED Final   Carbapenem resistance VIM NOT DETECTED NOT DETECTED Final    Comment: Performed at [facility], [address]  [place], [place] [address]  Culture, blood (Routine x 2)     Status: Abnormal   Collection Time: 11/24/20  5:09 PM   Specimen: BLOOD LEFT ARM  Result Value Ref Range Status   Specimen Description   Final    BLOOD LEFT ARM Performed at [facility], [place] [address], [place], [place] [address]    Special Requests   Final    BOTTLES DRAWN AEROBIC AND ANAEROBIC Blood Culture adequate volume Performed at [facility] [address], [place], [place] [address]    Culture  Setup Time   Final    GRAM POSITIVE COCCI GRAM NEGATIVE RODS Organism ID to follow IN BOTH AEROBIC AND ANAEROBIC BOTTLES CRITICAL RESULT CALLED TO, READ BACK BY AND VERIFIED WITH: PHARMD A.PHAN AT 1239 ON 11/25/2020 BY T.SAAD.    Culture (A)  Final    ENTEROBACTER ASBURIAE STAPHYLOCOCCUS EPIDERMIDIS THE SIGNIFICANCE OF ISOLATING THIS ORGANISM FROM A SINGLE SET OF BLOOD CULTURES WHEN MULTIPLE SETS ARE DRAWN IS UNCERTAIN. PLEASE NOTIFY THE MICROBIOLOGY DEPARTMENT WITHIN ONE WEEK IF SPECIATION AND SENSITIVITIES ARE REQUIRED. Performed at [facility], [place] [address] [place],  [address]    Report Status 11/27/2020 FINAL  Final   Organism ID, Bacteria ENTEROBACTER ASBURIAE  Final      Susceptibility   Enterobacter asburiae - MIC*    CEFAZOLIN >=64 RESISTANT Resistant     CEFEPIME <=0.12 SENSITIVE Sensitive     CEFTAZIDIME <=1 SENSITIVE Sensitive  CEFTRIAXONE <=0.25 SENSITIVE Sensitive     CIPROFLOXACIN <=0.25 SENSITIVE Sensitive     GENTAMICIN <=1 SENSITIVE Sensitive     IMIPENEM <=0.25 SENSITIVE Sensitive     TRIMETH/SULFA <=20 SENSITIVE Sensitive     PIP/TAZO <=4 SENSITIVE Sensitive     * ENTEROBACTER ASBURIAE  Blood Culture ID Panel (Reflexed)     Status: Abnormal   Collection Time: 11/24/20  5:09 PM  Result Value Ref Range Status   Enterococcus faecalis NOT DETECTED NOT DETECTED Final   Enterococcus Faecium NOT DETECTED NOT DETECTED Final   Listeria monocytogenes NOT DETECTED  NOT DETECTED Final   Staphylococcus species DETECTED (A) NOT DETECTED Final    Comment: CRITICAL RESULT CALLED TO, READ BACK BY AND VERIFIED WITH: PHARMD A.PHAN AT 1239 ON 11/25/2020 BY T.SAAD.    Staphylococcus aureus (BCID) NOT DETECTED NOT DETECTED Final   Staphylococcus epidermidis DETECTED (A) NOT DETECTED Final    Comment: Methicillin (oxacillin) resistant coagulase negative staphylococcus. Possible blood culture contaminant (unless isolated from more than one blood culture draw or clinical case suggests pathogenicity). No antibiotic treatment is indicated for blood  culture contaminants. CRITICAL RESULT CALLED TO, READ BACK BY AND VERIFIED WITH: PHARMD A.PHAN AT 1239 ON 11/25/2020 BY T.SAAD.    Staphylococcus lugdunensis NOT DETECTED NOT DETECTED Final   Streptococcus species NOT DETECTED NOT DETECTED Final   Streptococcus agalactiae NOT DETECTED NOT DETECTED Final   Streptococcus pneumoniae NOT DETECTED NOT DETECTED Final   Streptococcus pyogenes NOT DETECTED NOT DETECTED Final   A.calcoaceticus-baumannii NOT DETECTED NOT DETECTED Final   Bacteroides fragilis NOT DETECTED NOT DETECTED Final   Enterobacterales DETECTED (A) NOT DETECTED Final    Comment: Enterobacterales represent a large order of gram negative bacteria, not a single organism. CRITICAL RESULT CALLED TO, READ BACK BY AND VERIFIED WITH: PHARMD A.PHAN AT 1239 ON 11/25/2020 BY T.SAAD.    Enterobacter cloacae complex DETECTED (A) NOT DETECTED Final    Comment: CRITICAL RESULT CALLED TO, READ BACK BY AND VERIFIED WITH: PHARMD A.PHAN AT 1239 ON 11/25/2020 BY T.SAAD.    Escherichia coli NOT DETECTED NOT DETECTED Final   Klebsiella aerogenes NOT DETECTED NOT DETECTED Final   Klebsiella oxytoca NOT DETECTED NOT DETECTED Final   Klebsiella pneumoniae NOT DETECTED NOT DETECTED Final   Proteus species NOT DETECTED NOT DETECTED Final   Salmonella species NOT DETECTED NOT DETECTED Final   Serratia marcescens NOT DETECTED NOT  DETECTED Final   Haemophilus influenzae NOT DETECTED NOT DETECTED Final   Neisseria meningitidis NOT DETECTED NOT DETECTED Final   Pseudomonas aeruginosa NOT DETECTED NOT DETECTED Final   Stenotrophomonas maltophilia NOT DETECTED NOT DETECTED Final   Candida albicans NOT DETECTED NOT DETECTED Final   Candida auris NOT DETECTED NOT DETECTED Final   Candida glabrata NOT DETECTED NOT DETECTED Final   Candida krusei NOT DETECTED NOT DETECTED Final   Candida parapsilosis NOT DETECTED NOT DETECTED Final   Candida tropicalis NOT DETECTED NOT DETECTED Final   Cryptococcus neoformans/gattii NOT DETECTED NOT DETECTED Final   CTX-M ESBL NOT DETECTED NOT DETECTED Final   Carbapenem resistance IMP NOT DETECTED NOT DETECTED Final   Carbapenem resistance KPC NOT DETECTED NOT DETECTED Final   Methicillin resistance mecA/C DETECTED (A) NOT DETECTED Final    Comment: CRITICAL RESULT CALLED TO, READ BACK BY AND VERIFIED WITH: PHARMD A.PHAN AT 1239 ON 11/25/2020 BY T.SAAD.    Carbapenem resistance NDM NOT DETECTED NOT DETECTED Final   Carbapenem resist OXA 48 LIKE NOT DETECTED NOT DETECTED Final  Carbapenem resistance VIM NOT DETECTED NOT DETECTED Final    Comment: Performed at [facility], [place] [address], [place], [place] [address]  Resp Panel by RT-PCR (Flu A&B, Covid) Nasopharyngeal Swab     Status: None   Collection Time: 11/24/20  5:12 PM   Specimen: Nasopharyngeal Swab; Nasopharyngeal(NP) swabs in vial transport medium  Result Value Ref Range Status   SARS Coronavirus 2 by RT PCR NEGATIVE NEGATIVE Final    Comment: (NOTE) SARS-CoV-2 target nucleic acids are NOT DETECTED.  The SARS-CoV-2 RNA is generally detectable in upper respiratory specimens during the acute phase of infection. The lowest concentration of SARS-CoV-2 viral copies this assay can detect is 138 copies/mL. A negative result does not preclude SARS-Cov-2 infection and should not be used as the sole basis for treatment  or other patient management decisions. A negative result may occur with  improper specimen collection/handling, submission of specimen other than nasopharyngeal swab, presence of viral mutation(s) within the areas targeted by this assay, and inadequate number of viral copies(<138 copies/mL). A negative result must be combined with clinical observations, patient history, and epidemiological information. The expected result is Negative.  Fact Sheet for Patients:  EntrepreneurPulse.com.au  Fact Sheet for Healthcare Providers:  IncredibleEmployment.be  This test is no t yet approved or cleared by the [place] FDA and  has been authorized for detection and/or diagnosis of SARS-CoV-2 by FDA under an Emergency Use Authorization (EUA). This EUA will remain  in effect (meaning this test can be used) for the duration of the COVID-19 declaration under Section 564(b)(1) of the Act, 21 U.S.C.section 360bbb-3(b)(1), unless the authorization is terminated  or revoked sooner.       Influenza A by PCR NEGATIVE NEGATIVE Final   Influenza B by PCR NEGATIVE NEGATIVE Final    Comment: (NOTE) The Xpert Xpress SARS-CoV-2/FLU/RSV plus assay is intended as an aid in the diagnosis of influenza from Nasopharyngeal swab specimens and should not be used as a sole basis for treatment. Nasal washings and aspirates are unacceptable for Xpert Xpress SARS-CoV-2/FLU/RSV testing.  Fact Sheet for Patients: EntrepreneurPulse.com.au  Fact Sheet for Healthcare Providers: IncredibleEmployment.be  This test is not yet approved or cleared by the [place] FDA and has been authorized for detection and/or diagnosis of SARS-CoV-2 by FDA under an Emergency Use Authorization (EUA). This EUA will remain in effect (meaning this test can be used) for the duration of the COVID-19 declaration under Section 564(b)(1) of the Act, 21 U.S.C. section  360bbb-3(b)(1), unless the authorization is terminated or revoked.  Performed at [facility], [place] [address], [place], [place] [address]   Urine culture     Status: Abnormal   Collection Time: 11/24/20  8:30 PM   Specimen: In/Out Cath Urine  Result Value Ref Range Status   Specimen Description   Final    IN/OUT CATH URINE Performed at [facility] [address] [place], [place] [address]    Special Requests   Final    NONE Performed at [facility], [place] [address], [place], [place] [address]    Culture 60,000 COLONIES/mL ENTEROBACTER SPECIES (A)  Final   Report Status 11/27/2020 FINAL  Final   Organism ID, Bacteria ENTEROBACTER SPECIES (A)  Final      Susceptibility   Enterobacter species - MIC*    CEFAZOLIN >=64 RESISTANT Resistant     CEFEPIME <=0.12 SENSITIVE Sensitive     CEFTRIAXONE <=0.25 SENSITIVE Sensitive  CIPROFLOXACIN <=0.25 SENSITIVE Sensitive     GENTAMICIN <=1 SENSITIVE Sensitive     IMIPENEM <=0.25 SENSITIVE Sensitive     NITROFURANTOIN <=16 SENSITIVE Sensitive     TRIMETH/SULFA <=20 SENSITIVE Sensitive     PIP/TAZO <=4 SENSITIVE Sensitive     * 60,000 COLONIES/mL ENTEROBACTER SPECIES  Culture, blood (routine x 2)     Status: None (Preliminary result)   Collection Time: 11/27/20  5:37 AM   Specimen: BLOOD LEFT ARM  Result Value Ref Range Status   Specimen Description   Final    BLOOD LEFT ARM Performed at [facility] [address], [place], [place] [address]    Special Requests   Final    BOTTLES DRAWN AEROBIC ONLY Blood Culture adequate volume Performed at [facility] [address] [place], [place] [address]    Culture   Final    NO GROWTH 3 DAYS Performed at [facility], [place] [address], [place], [place] [address]    Report Status PENDING  Incomplete  Culture, blood (routine x 2)     Status: None (Preliminary result)   Collection Time:  11/27/20  5:37 AM   Specimen: BLOOD  Result Value Ref Range Status   Specimen Description   Final    BLOOD LEFT WRIST Performed at [facility] [address], [place], [place] [address]    Special Requests   Final    BOTTLES DRAWN AEROBIC ONLY Blood Culture adequate volume Performed at [facility] [address], [place], [place] [address]    Culture   Final    NO GROWTH 3 DAYS Performed at [facility], [place] [address], [place], [place] [address]    Report Status PENDING  Incomplete          Radiology Studies: ECHOCARDIOGRAM COMPLETE  Result Date: 11/28/2020    ECHOCARDIOGRAM REPORT   Patient Name:   HAYS DUNNIGAN Date of Exam: 11/28/2020 Medical Rec #:  [id number]    Height:       66.0 in Accession #:    [id number]   Weight:       144.4 lb Date of Birth:  1919/03/14    BSA:          1.741 m Patient Age:    43 years     BP:           166/84 mmHg Patient Gender: M            HR:           112 bpm. Exam Location:  Inpatient Procedure: 2D Echo, Cardiac Doppler and Color Doppler Indications:    R94.31 Abnormal EKG  History:        Patient has prior history of Echocardiogram examinations, most                 recent 05/23/2012.  Sonographer:    Merrie Roof RDCS Referring Phys: [id number] [place]  1. Left ventricular ejection fraction, by estimation, is 60 to 65%. The left ventricle has normal function. The left ventricle has no regional wall motion abnormalities. There is moderate left ventricular hypertrophy. Left ventricular diastolic function  could not be evaluated.  2. Right ventricular systolic function is normal. The right ventricular size is normal.  3. The mitral valve is normal in structure. Trivial mitral valve regurgitation.  4. Tricuspid valve regurgitation is moderate.  5. The aortic valve is grossly normal. Aortic valve  regurgitation is not visualized. No aortic stenosis is present. FINDINGS  Left Ventricle: Left  ventricular ejection fraction, by estimation, is 60 to 65%. The left ventricle has normal function. The left ventricle has no regional wall motion abnormalities. The left ventricular internal cavity size was normal in size. There is  moderate left ventricular hypertrophy. Left ventricular diastolic function could not be evaluated due to atrial fibrillation. Left ventricular diastolic function could not be evaluated. Right Ventricle: The right ventricular size is normal. No increase in right ventricular wall thickness. Right ventricular systolic function is normal. Left Atrium: Left atrial size was normal in size. Right Atrium: Right atrial size was normal in size. Pericardium: There is no evidence of pericardial effusion. Mitral Valve: The mitral valve is normal in structure. Trivial mitral valve regurgitation. Tricuspid Valve: The tricuspid valve is grossly normal. Tricuspid valve regurgitation is moderate. Aortic Valve: The aortic valve is grossly normal. Aortic valve regurgitation is not visualized. No aortic stenosis is present. Aortic valve mean gradient measures 4.0 mmHg. Aortic valve peak gradient measures 7.3 mmHg. Aortic valve area, by VTI measures 2.35 cm. Pulmonic Valve: The pulmonic valve was grossly normal. Pulmonic valve regurgitation is not visualized. Aorta: The aortic root and ascending aorta are structurally normal, with no evidence of dilitation. IAS/Shunts: The atrial septum is grossly normal.  LEFT VENTRICLE PLAX 2D LVIDd:         3.50 cm LVIDs:         2.30 cm LV PW:         1.40 cm LV IVS:        1.40 cm LVOT diam:     1.70 cm LV SV:         37 LV SV Index:   21 LVOT Area:     2.27 cm  RIGHT VENTRICLE          IVC RV Basal diam:  3.60 cm  IVC diam: 2.10 cm LEFT ATRIUM           Index       RIGHT ATRIUM           Index LA diam:      3.30 cm 1.90 cm/m  RA Area:     16.20 cm LA Vol (A2C): 48.7 ml 27.97 ml/m RA Volume:   33.20 ml  19.07 ml/m LA Vol (A4C): 44.9 ml 25.79 ml/m  AORTIC VALVE AV  Area (Vmax):    2.36 cm AV Area (Vmean):   2.36 cm AV Area (VTI):     2.35 cm AV Vmax:           135.50 cm/s AV Vmean:          93.550 cm/s AV VTI:            0.158 m AV Peak Grad:      7.3 mmHg AV Mean Grad:      4.0 mmHg LVOT Vmax:         141.00 cm/s LVOT Vmean:        97.300 cm/s LVOT VTI:          0.164 m LVOT/AV VTI ratio: 1.03  AORTA Ao Root diam: 3.20 cm Ao Asc diam:  3.30 cm TRICUSPID VALVE TR Peak grad:   49.6 mmHg TR Vmax:        352.00 cm/s  SHUNTS Systemic VTI:  0.16 m Systemic Diam: 1.70 cm Mertie Moores MD Electronically signed by Mertie Moores MD Signature Date/Time: 11/28/2020/5:28:44 PM    Final  Scheduled Meds:  amLODipine  5 mg Oral Daily   Carbidopa-Levodopa ER  1.5 tablet Oral TID   feeding supplement  237 mL Oral BID BM   metoprolol tartrate  12.5 mg Oral BID   Continuous Infusions:  meropenem (MERREM) IV 1 g (11/30/20 0548)      LOS: 6 days    Time spent: 35 minutes.     Elmarie Shiley, MD [facility]   If 7PM-7AM, please contact night-coverage [email or web address]  11/30/2020, 12:34 PM

## 2020-11-30 NOTE — Progress Notes (Addendum)
Physical Therapy Treatment Patient Details Name: [name] MRN: [id number] DOB: Nov 07, 1918 Today's Date: 11/30/2020    History of Present Illness [name] is a 87 y.o. male admitted for severe sepsis due to urinary tract infection after presenting from home to [facility] ED evaluation of confusion. PMH: Parkinson's disease, prostate cancer s/p radioactive beads and prostatectomy, CKD 3A    PT Comments    PT tx session per MD request. Family present and assisted with translating. With time, pt was able to participate and follow commands. He put forth good effort throughout session. He does fatigue fairly easily.  He demonstrates some ability to participate meaningfully with therapy,so he could possibly benefit from a ST rehab trial. Lengthy discussion with family and MD about d/c plan. Family expresses some concern about caring for pt in home.  I explained that, ultimately, decision will rest with pt and family. I also explained to family that I cannot predict how long pt will remain in rehab and/or how he will respond-based on today's performance he has some potential. I encouraged them to consider his age, medical recovery, medical history, and his desires. Highly recommend a CM/SW consult to help with decision-making/planning . Will continue to follow and progress activity as tolerated.     Follow Up Recommendations  SNF (rehab trial) vs home with hospice, 24/7 supervision/assist. (Depends on continued medical progress and family decision.)     Equipment Recommendations  None recommended by PT    Recommendations for Other Services       Precautions / Restrictions Precautions Precautions: Fall Precaution Comments: monitor HR Restrictions Weight Bearing Restrictions: No    Mobility  Bed Mobility Overal bed mobility: Needs Assistance Bed Mobility: Supine to Sit;Sit to Supine     Supine to sit: Max assist;HOB elevated Sit to supine: Max assist;HOB elevated   General bed mobility  comments: Assist for trunk and bil LEs. Utilized bedpad for scooting, positining. Multimodal cueing required with son translating    Transfers Overall transfer level: Needs assistance   Transfers: Sit to/from Stand Sit to Stand: Mod assist;From elevated surface         General transfer comment: Sit to stand x 4 with STEDY. Pt stood for ~45 seconds the last time with Min A. Fatigues easily. Followed commands with family encouraging and translating. Increased time. Seated breaks required.  Ambulation/Gait             General Gait Details: Did not attempt. Not safe with +1 assist. High fall risk.   Stairs             Wheelchair Mobility    Modified Rankin (Stroke Patients Only)       Balance Overall balance assessment: Needs assistance         Standing balance support: Bilateral upper extremity supported Standing balance-Leahy Scale: Poor                              Cognition Arousal/Alertness: Awake/alert Behavior During Therapy: Flat affect Overall Cognitive Status: Difficult to assess                                 General Comments: Pt appears to require increased time and cues with single step commands, pt's family interpreting and report pt is always calm      Exercises      General Comments  Pertinent Vitals/Pain Pain Assessment: Faces Faces Pain Scale: No hurt    Home Living                      Prior Function            PT Goals (current goals can now be found in the care plan section) Progress towards PT goals: Progressing toward goals    Frequency    Min 2X/week      PT Plan Current plan remains appropriate    Co-evaluation              AM-PAC PT "6 Clicks" Mobility   Outcome Measure  Help needed turning from your back to your side while in a flat bed without using bedrails?: Total Help needed moving from lying on your back to sitting on the side of a flat bed without  using bedrails?: Total Help needed moving to and from a bed to a chair (including a wheelchair)?: Total Help needed standing up from a chair using your arms (e.g., wheelchair or bedside chair)?: Total Help needed to walk in hospital room?: Total Help needed climbing 3-5 steps with a railing? : Total 6 Click Score: 6    End of Session   Activity Tolerance: Patient limited by fatigue;Patient tolerated treatment well Patient left: in bed;with call bell/phone within reach;with bed alarm set;with family/visitor present Nurse Communication: called to front desk requesting NT to assist pt with changing gown and replacing condom cathether-no one came during time I was in the room.  PT Visit Diagnosis: Muscle weakness (generalized) (M62.81);Difficulty in walking, not elsewhere classified (R26.2);Adult, failure to thrive (R62.7);Other abnormalities of gait and mobility (R26.89)     Time: 6384-5364 PT Time Calculation (min) (ACUTE ONLY): 55 min  Charges:  $Therapeutic Activity: 53-67 mins              Doreatha Massed, PT Acute Rehabilitation  Office: [phone] Pager: [phone]

## 2020-11-30 NOTE — Progress Notes (Addendum)
Daily Progress Note   Patient Name: Kooper Godshall       Date: 11/30/2020 DOB: [birth year]  Age: 87 y.o. MRN#: [id number] Attending Physician: Elmarie Shiley, MD Primary Care Physician: Vernie Shanks, MD Admit Date: 11/24/2020  Reason for Consultation/Follow-up: Establishing goals of care  Subjective: I met today with Mr. Beller, his wife, and their son.  He was awake and interactive today.  Family reports he has been increasing with his appetite.  We discussed options for his care moving forward.  His wife reports being confused on what to do next due to the fact that he does appear to be clinically improving and is more interactive and taking in more nutrition.  We talked through options for care and consideration for resumption of gentle medical interventions including resuming antibiotics.  Patient's son asked for realistic options moving forward.  He says that if his father recovers the point of being able to actively participate in rehab, they would like for him to have a trial of rehab.  We discussed that this may not be something he is able to participate in and discussed home hospice services as backup option.  Family reports that he may be able to go to live with patient's daughter in [place] which would allow their family to assist in his caregiving needs.  Discussed case with Dr. Tyrell Antonio as well as Margaretmary Eddy from [facility] collective who has been in discussion with family regarding hospice services.  Length of Stay: 6  Current Medications: Scheduled Meds:  . amLODipine  5 mg Oral Daily  . Carbidopa-Levodopa ER  1.5 tablet Oral TID  . feeding supplement  237 mL Oral BID BM  . metoprolol tartrate  12.5 mg Oral BID    Continuous Infusions: . meropenem (MERREM) IV 1 g  (11/30/20 0548)    PRN Meds: acetaminophen **OR** acetaminophen, albuterol, antiseptic oral rinse, hydrALAZINE, HYDROmorphone (DILAUDID) injection, ondansetron **OR** ondansetron (ZOFRAN) IV, polyvinyl alcohol  Physical Exam      General: Frail and weak appearing HEENT: No bruits, no goiter, no JVD Heart: Regular rate and rhythm. No murmur appreciated. Lungs: Good air movement, clear Abdomen: Soft, nontender, nondistended, positive bowel sounds.   Ext: No significant edema.  Muscle wasting noted Skin: Warm and dry  Vital Signs: BP (!) 193/80 (BP Location: Left Arm)  Pulse 75   Temp 97.9 F (36.6 C) (Oral)   Resp 17   Ht _0  (1.676 m)   Wt 66.7 kg   SpO2 97%   BMI 23.73 kg/m  SpO2: SpO2: 97 % O2 Device: O2 Device: Room Air O2 Flow Rate: O2 Flow Rate (L/min): 2 L/min  Intake/output summary:  Intake/Output Summary (Last 24 hours) at 11/30/2020 0902 Last data filed at 11/30/2020 0700 Gross per 24 hour  Intake 220.94 ml  Output 1250 ml  Net -1029.06 ml   LBM: Last BM Date: 11/28/20 Baseline Weight: Weight: 65.8 kg Most recent weight: Weight: 66.7 kg       Palliative Assessment/Data:    Flowsheet Rows    Flowsheet Row Most Recent Value  Intake Tab   Referral Department Hospitalist  Unit at Time of Referral Med/Surg Unit  Palliative Care Primary Diagnosis Sepsis/Infectious Disease  Date Notified 11/25/20  Palliative Care Type New Palliative care  Reason for referral Clarify Goals of Care  Date of Admission 11/24/20  Date first seen by Palliative Care 11/26/20  # of days Palliative referral response time 1 Day(s)  # of days IP prior to Palliative referral 1  Clinical Assessment   Palliative Performance Scale Score 30%  Psychosocial & Spiritual Assessment   Palliative Care Outcomes   Patient/Family meeting held? Yes       Patient Active Problem List   Diagnosis Date Noted  . Acute cystitis 11/25/2020  . Acute metabolic encephalopathy 26/37/8588  .  Lactic acidosis 11/25/2020  . Acute kidney injury superimposed on CKD ([place]) 11/25/2020  . Generalized weakness 11/25/2020  . Acute hyponatremia 11/25/2020  . Severe sepsis ([place]) 11/24/2020  . Sialorrhea 01/28/2016  . Memory disorder 01/28/2016  . Parkinson disease ([place]) 10/23/2015  . Gait disorder 02/24/2014  . Edema 09/28/2011  . Bronchospasm 08/18/2011  . Annual physical exam 03/31/2011  . RASH-NONVESICULAR 06/09/2010  . Anemia 12/30/2009  . DM2 (diabetes mellitus, type 2) ([place]) 09/01/2009  . ALLERGIC RHINITIS 08/20/2009  . NEOPLASM, MALIGNANT, SKIN 08/30/2007  . ADENOCARCINOMA, PROSTATE, HX OF 03/21/2007  . HLD (hyperlipidemia) 09/29/2006  . GOUT 09/29/2006  . HIATAL HERNIA 09/29/2006    Palliative Care Assessment & Plan   Patient Profile: Mr. Lenker is a 87 year old male with past medical history of Parkinson's disease, prostate cancer status post prostatectomy, CKD stage III who had a fall at home and was brought to the hospital.  He is very functional at baseline and they walk 7 blocks daily.  He is currently admitted with bacteremia secondary to UTI.  Palliative consulted for goals of care.  Recommendations/Plan: Family would like to resume gentle medical therapies to see if he has a chance to further improve. Discussed that if he does continue to improve and is able to work with PT, plan moving forward would be to assess for trial of rehab.  If he remains debilitated and bedbound, we discussed option of home hospice at his daughter's house in [place] as this would allow both patient's wife and daughter's family to participate in caring for him with hospice support. Discussed with Dr. Tyrell Antonio who will resume antibiotics. Continue to follow.  Goals of Care and Additional Recommendations: Limitations on Scope of Treatment: Full Comfort Care  Code Status:    Code Status Orders  (From admission, onward)           Start     Ordered   11/28/20 1533  Do not attempt  resuscitation (DNR)  Continuous  Question Answer Comment  In the event of cardiac or respiratory ARREST Do not call a "code blue"   In the event of cardiac or respiratory ARREST Do not perform Intubation, CPR, defibrillation or ACLS   In the event of cardiac or respiratory ARREST Use medication by any route, position, wound care, and other measures to relive pain and suffering. May use oxygen, suction and manual treatment of airway obstruction as needed for comfort.      11/28/20 1535           Code Status History     Date Active Date Inactive Code Status Order ID Comments User Context   11/27/2020 1214 11/28/2020 1535 DNR [id number]  Micheline Rough, MD Inpatient   11/24/2020 2130 11/27/2020 1213 Full Code [id number]  Howerter, Ethelda Chick, DO ED      Advance Directive Documentation    Flowsheet Row Most Recent Value  Type of Advance Directive Healthcare Power of Attorney, Living will  Pre-existing out of facility DNR order (yellow form or pink MOST form) --  "MOST" Form in Place? --       Prognosis: Guarded  Discharge Planning: To Be Determined  Care plan was discussed with son, wife, bedside RN, Dr. Tyrell Antonio  Thank you for allowing the Palliative Medicine Team to assist in the care of this patient.   Time In: 1500 Time Out: 1545 Total time 45 Prolonged Time Billed No      Greater than 50%  of this time was spent counseling and coordinating care related to the above assessment and plan.  Micheline Rough, MD  Please contact Palliative Medicine Team phone at [phone] for questions and concerns.

## 2020-12-01 ENCOUNTER — Ambulatory Visit: Payer: Medicare PPO

## 2020-12-01 DIAGNOSIS — Z515 Encounter for palliative care: Secondary | ICD-10-CM | POA: Diagnosis not present

## 2020-12-01 DIAGNOSIS — N179 Acute kidney failure, unspecified: Secondary | ICD-10-CM | POA: Diagnosis not present

## 2020-12-01 DIAGNOSIS — A419 Sepsis, unspecified organism: Secondary | ICD-10-CM | POA: Diagnosis not present

## 2020-12-01 DIAGNOSIS — G9341 Metabolic encephalopathy: Secondary | ICD-10-CM | POA: Diagnosis not present

## 2020-12-01 DIAGNOSIS — R6520 Severe sepsis without septic shock: Secondary | ICD-10-CM | POA: Diagnosis not present

## 2020-12-01 LAB — BASIC METABOLIC PANEL
Anion gap: 5 (ref 5–15)
BUN: 53 mg/dL — ABNORMAL HIGH (ref 8–23)
CO2: 29 mmol/L (ref 22–32)
Calcium: 8.4 mg/dL — ABNORMAL LOW (ref 8.9–10.3)
Chloride: 109 mmol/L (ref 98–111)
Creatinine, Ser: 1.45 mg/dL — ABNORMAL HIGH (ref 0.61–1.24)
GFR, Estimated: 43 mL/min — ABNORMAL LOW (ref >60)
Glucose, Bld: 166 mg/dL — ABNORMAL HIGH (ref 70–99)
Potassium: 4.2 mmol/L (ref 3.5–5.1)
Sodium: 143 mmol/L (ref 135–145)

## 2020-12-01 MED ORDER — AMLODIPINE BESYLATE 10 MG PO TABS
10.0000 mg | ORAL_TABLET | Freq: Every day | ORAL | Status: DC
  Administered 2020-12-02: 10 mg via ORAL
  Filled 2020-12-01: qty 1

## 2020-12-01 NOTE — TOC Initial Note (Addendum)
Transition of Care [facility]) - Initial/Assessment Note    Patient Details  Name: [name] MRN: [id number] Date of Birth: 1918/12/15  [facility] CM/SW Contact:    Lynnell Catalan, RN Phone Number: 12/01/2020, 2:24 PM  Clinical Narrative:                 Spoke at bedside with pt wife and son [name]. Pt sleeping at time of conversation. Wife and son request looking into SNF placement for pt for a short term stay. [name] states that pt cannot go back home with wife as she is unable to physically care for him. Extensive conversation had around rehab placement vs long term care. FL2 faxed out to area SNFs. Auth started with [facility] [id number]. [facility] also started. Additional clinicals faxed as requested.  Expected Discharge Plan: Skilled Nursing Facility Barriers to Discharge: Continued Medical Work up  Expected Discharge Plan and Services Expected Discharge Plan: [place]   Discharge Planning Services: CM Consult   Living arrangements for the past 2 months: Single Family Home                   Prior Living Arrangements/Services Living arrangements for the past 2 months: Single Family Home Lives with:: Spouse Patient language and need for interpreter reviewed:: Yes        Need for Family Participation in Patient Care: Yes (Comment) Care giver support system in place?: Yes (comment)   Criminal Activity/Legal Involvement Pertinent to Current Situation/Hospitalization: No - Comment as needed  Activities of Daily Living Home Assistive Devices/Equipment: Hearing aid, Dentures (specify type), Cane (specify quad or straight), Eyeglasses (has 2 hearing aides but does not wear them, full bottom denture) ADL Screening (condition at time of admission) Patient's cognitive ability adequate to safely complete daily activities?: No Is the patient deaf or have difficulty hearing?: Yes Does the patient have difficulty seeing, even when wearing glasses/contacts?:  No Does the patient have difficulty concentrating, remembering, or making decisions?: Yes Patient able to express need for assistance with ADLs?: No Does the patient have difficulty dressing or bathing?: Yes Independently performs ADLs?: No Communication: Needs assistance Is this a change from baseline?: Pre-admission baseline Dressing (OT): Needs assistance Is this a change from baseline?: Pre-admission baseline Grooming: Needs assistance Is this a change from baseline?: Pre-admission baseline Feeding: Needs assistance Is this a change from baseline?: Pre-admission baseline Bathing: Needs assistance Is this a change from baseline?: Pre-admission baseline Toileting: Needs assistance Is this a change from baseline?: Pre-admission baseline In/Out Bed: Needs assistance Is this a change from baseline?: Pre-admission baseline Walks in Home: Dependent Is this a change from baseline?: Pre-admission baseline Does the patient have difficulty walking or climbing stairs?: Yes Weakness of Legs: Both Weakness of Arms/Hands: Both  Permission Sought/Granted Permission sought to share information with : Chartered certified accountant granted to share information with : Yes, Verbal Permission Granted     Permission granted to share info w AGENCY: To SNF's in Hub        Emotional Assessment Appearance:: Appears stated age Attitude/Demeanor/Rapport: Lethargic          Admission diagnosis:  Metabolic encephalopathy [P32.95] Lower urinary tract infectious disease [N39.0] Sepsis ([place]) [A41.9] Severe sepsis ([place]) [A41.9, R65.20] Sepsis with acute renal failure without septic shock, due to unspecified organism, unspecified acute renal failure type ([place]) [A41.9, R65.20, N17.9] Patient Active Problem List   Diagnosis Date Noted   Acute cystitis 18/84/1660   Acute metabolic encephalopathy 63/06/6008  Lactic acidosis 11/25/2020   Acute kidney injury superimposed on CKD ([place])  11/25/2020   Generalized weakness 11/25/2020   Acute hyponatremia 11/25/2020   Severe sepsis ([place]) 11/24/2020   Sialorrhea 01/28/2016   Memory disorder 01/28/2016   Parkinson disease ([place]) 10/23/2015   Gait disorder 02/24/2014   Edema 09/28/2011   Bronchospasm 08/18/2011   Annual physical exam 03/31/2011   RASH-NONVESICULAR 06/09/2010   Anemia 12/30/2009   DM2 (diabetes mellitus, type 2) ([place]) 09/01/2009   ALLERGIC RHINITIS 08/20/2009   NEOPLASM, MALIGNANT, SKIN 08/30/2007   ADENOCARCINOMA, PROSTATE, HX OF 03/21/2007   HLD (hyperlipidemia) 09/29/2006   GOUT 09/29/2006   HIATAL HERNIA 09/29/2006   PCP:  Vernie Shanks, MD Pharmacy:   [facility] [address], [place] - [place] AT [place] [address] [place] [address] Phone: [phone] Fax: [phone]     Social Determinants of Health ([place]) Interventions    Readmission Risk Interventions No flowsheet data found.

## 2020-12-01 NOTE — Consult Note (Addendum)
WOC consult requested for pressure injury.  Assessed bilat heels, back, sacrum and buttocks with wife at the bedside watching the procedure.  All areas with intact skin and no pressure injuries. Darker colored skin surrounding rectum, but appearance and location is not consistent with a deep tissue pressure injury. No topical treatment is indicated. Pt has a preventive sacral foam dressing in place. Please re-consult if further assistance is needed.  Thank-you,  Julien Girt MSN, [place], [place], [place], [place]

## 2020-12-01 NOTE — Progress Notes (Signed)
Chaplains received PG this AM requesting assistance arranging for Catholic priest to come administer Sacrament of the Sick for pt.  [place] contacted [place]; [place] visited this afternoon and confirmed priest had been able to come: "we feel so much better" wife shared.  Pt.'s family grateful for priest's visit and for 's help in coordinating.  Chaplains remain available as needed.  Lindaann Pascal PRN Chaplain Pager: [phone]

## 2020-12-01 NOTE — Progress Notes (Signed)
PROGRESS NOTE    [name]  PNT:[id number] DOB: December 08, 1918 DOA: 11/24/2020 PCP: Vernie Shanks, MD   Brief Narrative: 87 year old with past medical history significant for Parkinson's disease, prostate cancer, history of prostatectomy, CKD stage IIIa, baseline creatinine around 1.4, who presents status post fall at home and wife called EMS and brought him to the hospital. At baseline patient was able to walk 7 blocks daily.  She was concerned as patient was becoming more and more weak, generalized weakness, altered mental status, increased somnolence.  Denies history of fever, chills, cough, nausea, vomiting, diarrhea.  She also noticed his oral intake was poor.   Assessment & Plan:   Principal Problem:   Severe sepsis ([place]) Active Problems:   DM2 (diabetes mellitus, type 2) (HCC)   HLD (hyperlipidemia)   Anemia   Parkinson disease ([place])   Acute cystitis   Acute metabolic encephalopathy   Lactic acidosis   Acute kidney injury superimposed on CKD ([place])   Generalized weakness   Acute hyponatremia  1-Enterobacter Asburiae Bacteremia likely secondary to UTI, Sepsis:  -Patient presents  with fever,  Temperature 102, Tachypnea, Respiration rate 36, leukocytosis white blood cell 15, confusion, thrombocytopenia. -Blood Cultures: Positive for Enterobacter. -Treated with cefepime from 6/13 until 6/17. Antibiotics discontinue because patient was transition to comfort care.  -Imipenem started 6/18, avoid cefepime to avoid neurotoxicity. Day 7 antibiotics. Patient will need total 2 weeks antibiotics.  -Repeated Blood culture 6/16 no growth to date.  -Plan to discharge on Bactrim, now that renal function has improved.  -Stable.   2-Acute metabolic encephalopathy, secondary to sepsis/GNR bacteremia; -CT head: No acute intracranial process. -Patient was alert on  my evaluation. He was able to answer questions, he feels very weak, so weak that he can barely speak -Infectious process -TSH  was normal, ammonia level was negative.  -He is more alert, per family he is making more sense.  -Patient was able to be up yesterday with therapy, he was able to follow commands and cooperate.  -He is able to answer questions today and follows command.   3-Failure to Thrive:  Suspect this is related to sepsis on top of underlying Parkinson's disease and fragility/  I am concern  with his ability of regaining his strength after his hospitalization.  -TSH was normal, Ammonia normal, Mg Normal.  - Cortisol level and B 12 level. Not low.  -Plan for trial of SNF with Rehab.   4-A fib RVR;  Secondary to fever and rigors 6/14. ECHO Normal EF.  Decision was made to hold on anticoagulation due to thrombocytopenia and advance age.  Started low dose metoprolol.   5-Acute Hypoxic Respiratory failure;  Suspect related to hypoventilation, question if aspiration.  Concern for sleep apnea. Might need oxygen while sleep.  Nebulizer treatments order PRN.   6-Elevated troponin;  Demand ischemia related to sepsis.  Troponin 63--75--38  7-Mild Hyponatremia; Received IV fluids.  DM type 2: HbA1c 5.2.  One out of two Blood culture positive for Staph epidermidis; Likely Contaminate.  Repeated blood culture no growth to date.   Parkinson Diseases:  Continue with Levodopa and carbidopa.   AKI on CKD stage a; CR baseline 1.4 Improved with IV fluids. Cr peak to 2.0 Down to 1.3--1.4  HTN; Continue with Norvasc and metoprolol.  Increase Norvasc to 10 mg  HLD; on statins.   Dysphagia; on Dysphagia 3 diet.   Anemia; started iron supplement.  Thrombocytopenia; in setting of infectious process. improved  Goals of  care.  Plan for SNF with rehab.   Nutrition Problem: Inadequate oral intake Etiology: lethargy/confusion    Signs/Symptoms: per patient/family report    Interventions: Ensure Enlive (each supplement provides 350kcal and 20 grams of protein), Magic cup  Estimated body mass  index is 25.44 kg/m as calculated from the following:   Height as of this encounter: 5\' 6"  (1.676 m).   Weight as of this encounter: 71.5 kg.   DVT prophylaxis: SCD Code Status: DNR Family Communication: Son who was at bedside.  Disposition Plan:  Status is: Inpatient  Remains inpatient appropriate because:IV treatments appropriate due to intensity of illness or inability to take PO  Dispo: The patient is from: Home              Anticipated d/c is to:  to be determine              Patient currently is not medically stable to d/c.   Difficult to place patient No        Consultants:  Palliative care  Procedures:  ECHO Normal EF  Antimicrobials:    Subjective: He is alert, couldn't sleep last night.  He is breathing better, denies pain. His speech is stronger. He is following command.   Objective: Vitals:   11/30/20 1233 11/30/20 2055 12/01/20 0017 12/01/20 0457  BP: (!) 148/69 (!) 172/68 (!) 163/57 (!) 194/79  Pulse: 80 75 81 83  Resp: 17 20 (!) 24 20  Temp: 98 F (36.7 C) 98.8 F (37.1 C) (!) 97.5 F (36.4 C) 98 F (36.7 C)  TempSrc: Oral Oral Oral Oral  SpO2: 97% 96% 93% 92%  Weight:    71.5 kg  Height:        Intake/Output Summary (Last 24 hours) at 12/01/2020 1202 Last data filed at 12/01/2020 9470 Gross per 24 hour  Intake 1017 ml  Output 1150 ml  Net -133 ml    Filed Weights   11/29/20 0437 11/30/20 0621 12/01/20 0457  Weight: 67.4 kg 66.7 kg 71.5 kg    Examination:  General exam: Alert Respiratory system:CTA Cardiovascular system:  S 1, S 2 RRR Gastrointestinal system: B, soft nt Central nervous system: Alert, follows command. Moves upper extremities. LE BL weakness Extremities: no edema   Data Reviewed: I have personally reviewed following labs and imaging studies  CBC: Recent Labs  Lab 11/24/20 1704 11/25/20 0230 11/25/20 0454 11/25/20 1138 11/25/20 1457 11/27/20 0537 11/28/20 0539 11/30/20 0643  WBC 11.4*  --  10.1  --   15.0* 6.2 6.9 10.3  NEUTROABS 10.3*  --  9.1*  --   --  5.5 5.6  --   HGB 8.6*   < > 8.1* 9.1* 10.2* 8.0* 7.9* 8.7*  HCT 25.5*   < > 24.8* 27.4* 30.8* 24.3* 24.4* 25.9*  MCV 99.2  --  99.6  --  99.4 100.4* 100.8* 99.2  PLT 124*  --  104*  --  128* 111* 107* 147*   < > = values in this interval not displayed.    Basic Metabolic Panel: Recent Labs  Lab 11/24/20 1704 11/25/20 0454 11/25/20 1457 11/26/20 2034 11/27/20 0537 11/28/20 0539 11/30/20 0643 12/01/20 1021  NA 133* 134* 135  --  139 142 143 143  K 4.5 4.0 4.5  --  3.6 3.6 3.7 4.2  CL 102 102 104  --  109 111 111 109  CO2 25 24 20*  --  22 24 27 29   GLUCOSE 111* 97 80  --  127* 138* 135* 166*  BUN 37* 39* 42*  --  61* 55* 48* 53*  CREATININE 2.13* 1.80* 1.86*  --  1.68* 1.66* 1.32* 1.45*  CALCIUM 8.0* 7.7* 8.0*  --  7.8* 7.8* 8.2* 8.4*  MG 2.1 2.0  --   --  2.3 2.4  --   --   PHOS  --   --   --  3.8 3.6  --   --   --     GFR: Estimated Creatinine Clearance: 25.1 mL/min (A) (by C-G formula based on SCr of 1.45 mg/dL (H)). Liver Function Tests: Recent Labs  Lab 11/24/20 1704 11/25/20 0454 11/25/20 1457 11/27/20 0537 11/28/20 0539  AST 36 40 46* 22 25  ALT 5 8 14 18  <5  ALKPHOS 57 48 61 52 50  BILITOT 1.1 0.9 1.0 0.6 0.3  PROT 6.3* 5.7* 6.5 5.7* 5.4*  ALBUMIN 3.2* 2.6* 2.8* 2.3* 2.0*    No results for input(s): LIPASE, AMYLASE in the last 168 hours. Recent Labs  Lab 11/25/20 0455  AMMONIA 11    Coagulation Profile: Recent Labs  Lab 11/24/20 1704 11/25/20 0454  INR 1.1 1.4*    Cardiac Enzymes: No results for input(s): CKTOTAL, CKMB, CKMBINDEX, TROPONINI in the last 168 hours. BNP (last 3 results) No results for input(s): PROBNP in the last 8760 hours. HbA1C: No results for input(s): HGBA1C in the last 72 hours. CBG: Recent Labs  Lab 11/27/20 1748 11/28/20 0003 11/28/20 0613 11/28/20 1150 11/29/20 0016  GLUCAP 137* 187* 129* 112* 152*    Lipid Profile: No results for input(s): CHOL,  HDL, LDLCALC, TRIG, CHOLHDL, LDLDIRECT in the last 72 hours. Thyroid Function Tests: No results for input(s): TSH, T4TOTAL, FREET4, T3FREE, THYROIDAB in the last 72 hours.  Anemia Panel: Recent Labs    11/28/20 1400  VITAMINB12 1,137*    Sepsis Labs: Recent Labs  Lab 11/24/20 1704 11/24/20 1904  LATICACIDVEN 2.4* 1.4     Recent Results (from the past 240 hour(s))  Culture, blood (Routine x 2)     Status: Abnormal   Collection Time: 11/24/20  5:04 PM   Specimen: BLOOD RIGHT ARM  Result Value Ref Range Status   Specimen Description   Final    BLOOD RIGHT ARM Performed at [facility], [place] [address] [place], [place] [address]    Special Requests   Final    BOTTLES DRAWN AEROBIC AND ANAEROBIC Blood Culture adequate volume Performed at [facility] [address] [place], [place] [address]    Culture  Setup Time   Final    GRAM NEGATIVE RODS IN BOTH AEROBIC AND ANAEROBIC BOTTLES Organism ID to follow CRITICAL RESULT CALLED TO, READ BACK BY AND VERIFIED WITH: PHARMD NATHAN BATCHELDER AT 2426 ON 11/25/20 [place] KJ    Culture (A)  Final    ENTEROBACTER ASBURIAE SUSCEPTIBILITIES PERFORMED ON PREVIOUS CULTURE WITHIN THE LAST 5 DAYS. Performed at [facility], [place] [address], [place], [place] [address]    Report Status 11/27/2020 FINAL  Final  Blood Culture ID Panel (Reflexed)     Status: Abnormal   Collection Time: 11/24/20  5:04 PM  Result Value Ref Range Status   Enterococcus faecalis NOT DETECTED NOT DETECTED Final   Enterococcus Faecium NOT DETECTED NOT DETECTED Final   Listeria monocytogenes NOT DETECTED NOT DETECTED Final   Staphylococcus species NOT DETECTED NOT DETECTED Final   Staphylococcus aureus (BCID) NOT DETECTED NOT DETECTED Final   Staphylococcus epidermidis NOT  DETECTED NOT DETECTED Final   Staphylococcus lugdunensis NOT DETECTED NOT DETECTED Final   Streptococcus species NOT DETECTED NOT DETECTED Final   Streptococcus  agalactiae NOT DETECTED NOT DETECTED Final   Streptococcus pneumoniae NOT DETECTED NOT DETECTED Final   Streptococcus pyogenes NOT DETECTED NOT DETECTED Final   A.calcoaceticus-baumannii NOT DETECTED NOT DETECTED Final   Bacteroides fragilis NOT DETECTED NOT DETECTED Final   Enterobacterales DETECTED (A) NOT DETECTED Final    Comment: Enterobacterales represent a large order of gram negative bacteria, not a single organism. CRITICAL RESULT CALLED TO, READ BACK BY AND VERIFIED WITH: [place] AT 7408 ON 11/25/20 BY KJ    Enterobacter cloacae complex DETECTED (A) NOT DETECTED Final    Comment: CRITICAL RESULT CALLED TO, READ BACK BY AND VERIFIED WITH: [place] AT 1117 ON 11/25/20 BY KJ    Escherichia coli NOT DETECTED NOT DETECTED Final   Klebsiella aerogenes NOT DETECTED NOT DETECTED Final   Klebsiella oxytoca NOT DETECTED NOT DETECTED Final   Klebsiella pneumoniae NOT DETECTED NOT DETECTED Final   Proteus species NOT DETECTED NOT DETECTED Final   Salmonella species NOT DETECTED NOT DETECTED Final   Serratia marcescens NOT DETECTED NOT DETECTED Final   Haemophilus influenzae NOT DETECTED NOT DETECTED Final   Neisseria meningitidis NOT DETECTED NOT DETECTED Final   Pseudomonas aeruginosa NOT DETECTED NOT DETECTED Final   Stenotrophomonas maltophilia NOT DETECTED NOT DETECTED Final   Candida albicans NOT DETECTED NOT DETECTED Final   Candida auris NOT DETECTED NOT DETECTED Final   Candida glabrata NOT DETECTED NOT DETECTED Final   Candida krusei NOT DETECTED NOT DETECTED Final   Candida parapsilosis NOT DETECTED NOT DETECTED Final   Candida tropicalis NOT DETECTED NOT DETECTED Final   Cryptococcus neoformans/gattii NOT DETECTED NOT DETECTED Final   CTX-M ESBL NOT DETECTED NOT DETECTED Final   Carbapenem resistance IMP NOT DETECTED NOT DETECTED Final   Carbapenem resistance KPC NOT DETECTED NOT DETECTED Final   Carbapenem resistance NDM NOT DETECTED NOT  DETECTED Final   Carbapenem resist OXA 48 LIKE NOT DETECTED NOT DETECTED Final   Carbapenem resistance VIM NOT DETECTED NOT DETECTED Final    Comment: Performed at [facility], [address], [place], [place] [address]  Culture, blood (Routine x 2)     Status: Abnormal   Collection Time: 11/24/20  5:09 PM   Specimen: BLOOD LEFT ARM  Result Value Ref Range Status   Specimen Description   Final    BLOOD LEFT ARM Performed at [facility], [place] [address] [place], [place] [address]    Special Requests   Final    BOTTLES DRAWN AEROBIC AND ANAEROBIC Blood Culture adequate volume Performed at [facility] [address], [place], [place] [address]    Culture  Setup Time   Final    GRAM POSITIVE COCCI GRAM NEGATIVE RODS Organism ID to follow IN BOTH AEROBIC AND ANAEROBIC BOTTLES CRITICAL RESULT CALLED TO, READ BACK BY AND VERIFIED WITH: PHARMD A.PHAN AT 1239 ON 11/25/2020 BY T.SAAD.    Culture (A)  Final    ENTEROBACTER ASBURIAE STAPHYLOCOCCUS EPIDERMIDIS THE SIGNIFICANCE OF ISOLATING THIS ORGANISM FROM A SINGLE SET OF BLOOD CULTURES WHEN MULTIPLE SETS ARE DRAWN IS UNCERTAIN. PLEASE NOTIFY THE MICROBIOLOGY DEPARTMENT WITHIN ONE WEEK IF SPECIATION AND SENSITIVITIES ARE REQUIRED. Performed at [facility], [place] [address] [place], [place] [address]    Report Status 11/27/2020 FINAL  Final   Organism ID, Bacteria  ENTEROBACTER ASBURIAE  Final      Susceptibility   Enterobacter asburiae - MIC*    CEFAZOLIN >=64 RESISTANT Resistant     CEFEPIME <=0.12 SENSITIVE Sensitive     CEFTAZIDIME <=1 SENSITIVE Sensitive     CEFTRIAXONE <=0.25 SENSITIVE Sensitive     CIPROFLOXACIN <=0.25 SENSITIVE Sensitive     GENTAMICIN <=1 SENSITIVE Sensitive     IMIPENEM <=0.25 SENSITIVE Sensitive     TRIMETH/SULFA <=20 SENSITIVE Sensitive     PIP/TAZO <=4 SENSITIVE Sensitive     * ENTEROBACTER ASBURIAE  Blood Culture ID Panel (Reflexed)     Status: Abnormal    Collection Time: 11/24/20  5:09 PM  Result Value Ref Range Status   Enterococcus faecalis NOT DETECTED NOT DETECTED Final   Enterococcus Faecium NOT DETECTED NOT DETECTED Final   Listeria monocytogenes NOT DETECTED NOT DETECTED Final   Staphylococcus species DETECTED (A) NOT DETECTED Final    Comment: CRITICAL RESULT CALLED TO, READ BACK BY AND VERIFIED WITH: PHARMD A.PHAN AT 1239 ON 11/25/2020 BY T.SAAD.    Staphylococcus aureus (BCID) NOT DETECTED NOT DETECTED Final   Staphylococcus epidermidis DETECTED (A) NOT DETECTED Final    Comment: Methicillin (oxacillin) resistant coagulase negative staphylococcus. Possible blood culture contaminant (unless isolated from more than one blood culture draw or clinical case suggests pathogenicity). No antibiotic treatment is indicated for blood  culture contaminants. CRITICAL RESULT CALLED TO, READ BACK BY AND VERIFIED WITH: PHARMD A.PHAN AT 1239 ON 11/25/2020 BY T.SAAD.    Staphylococcus lugdunensis NOT DETECTED NOT DETECTED Final   Streptococcus species NOT DETECTED NOT DETECTED Final   Streptococcus agalactiae NOT DETECTED NOT DETECTED Final   Streptococcus pneumoniae NOT DETECTED NOT DETECTED Final   Streptococcus pyogenes NOT DETECTED NOT DETECTED Final   A.calcoaceticus-baumannii NOT DETECTED NOT DETECTED Final   Bacteroides fragilis NOT DETECTED NOT DETECTED Final   Enterobacterales DETECTED (A) NOT DETECTED Final    Comment: Enterobacterales represent a large order of gram negative bacteria, not a single organism. CRITICAL RESULT CALLED TO, READ BACK BY AND VERIFIED WITH: PHARMD A.PHAN AT 1239 ON 11/25/2020 BY T.SAAD.    Enterobacter cloacae complex DETECTED (A) NOT DETECTED Final    Comment: CRITICAL RESULT CALLED TO, READ BACK BY AND VERIFIED WITH: PHARMD A.PHAN AT 1239 ON 11/25/2020 BY T.SAAD.    Escherichia coli NOT DETECTED NOT DETECTED Final   Klebsiella aerogenes NOT DETECTED NOT DETECTED Final   Klebsiella oxytoca NOT DETECTED  NOT DETECTED Final   Klebsiella pneumoniae NOT DETECTED NOT DETECTED Final   Proteus species NOT DETECTED NOT DETECTED Final   Salmonella species NOT DETECTED NOT DETECTED Final   Serratia marcescens NOT DETECTED NOT DETECTED Final   Haemophilus influenzae NOT DETECTED NOT DETECTED Final   Neisseria meningitidis NOT DETECTED NOT DETECTED Final   Pseudomonas aeruginosa NOT DETECTED NOT DETECTED Final   Stenotrophomonas maltophilia NOT DETECTED NOT DETECTED Final   Candida albicans NOT DETECTED NOT DETECTED Final   Candida auris NOT DETECTED NOT DETECTED Final   Candida glabrata NOT DETECTED NOT DETECTED Final   Candida krusei NOT DETECTED NOT DETECTED Final   Candida parapsilosis NOT DETECTED NOT DETECTED Final   Candida tropicalis NOT DETECTED NOT DETECTED Final   Cryptococcus neoformans/gattii NOT DETECTED NOT DETECTED Final   CTX-M ESBL NOT DETECTED NOT DETECTED Final   Carbapenem resistance IMP NOT DETECTED NOT DETECTED Final   Carbapenem resistance KPC NOT DETECTED NOT DETECTED Final   Methicillin resistance mecA/C DETECTED (A) NOT DETECTED Final  Comment: CRITICAL RESULT CALLED TO, READ BACK BY AND VERIFIED WITH: PHARMD A.PHAN AT 1239 ON 11/25/2020 BY T.SAAD.    Carbapenem resistance NDM NOT DETECTED NOT DETECTED Final   Carbapenem resist OXA 48 LIKE NOT DETECTED NOT DETECTED Final   Carbapenem resistance VIM NOT DETECTED NOT DETECTED Final    Comment: Performed at [facility], [place] [address], [place], [place] [address]  Resp Panel by RT-PCR (Flu A&B, Covid) Nasopharyngeal Swab     Status: None   Collection Time: 11/24/20  5:12 PM   Specimen: Nasopharyngeal Swab; Nasopharyngeal(NP) swabs in vial transport medium  Result Value Ref Range Status   SARS Coronavirus 2 by RT PCR NEGATIVE NEGATIVE Final    Comment: (NOTE) SARS-CoV-2 target nucleic acids are NOT DETECTED.  The SARS-CoV-2 RNA is generally detectable in upper respiratory specimens during the acute phase of  infection. The lowest concentration of SARS-CoV-2 viral copies this assay can detect is 138 copies/mL. A negative result does not preclude SARS-Cov-2 infection and should not be used as the sole basis for treatment or other patient management decisions. A negative result may occur with  improper specimen collection/handling, submission of specimen other than nasopharyngeal swab, presence of viral mutation(s) within the areas targeted by this assay, and inadequate number of viral copies(<138 copies/mL). A negative result must be combined with clinical observations, patient history, and epidemiological information. The expected result is Negative.  Fact Sheet for Patients:  EntrepreneurPulse.com.au  Fact Sheet for Healthcare Providers:  IncredibleEmployment.be  This test is no t yet approved or cleared by the [place] FDA and  has been authorized for detection and/or diagnosis of SARS-CoV-2 by FDA under an Emergency Use Authorization (EUA). This EUA will remain  in effect (meaning this test can be used) for the duration of the COVID-19 declaration under Section 564(b)(1) of the Act, 21 U.S.C.section 360bbb-3(b)(1), unless the authorization is terminated  or revoked sooner.       Influenza A by PCR NEGATIVE NEGATIVE Final   Influenza B by PCR NEGATIVE NEGATIVE Final    Comment: (NOTE) The Xpert Xpress SARS-CoV-2/FLU/RSV plus assay is intended as an aid in the diagnosis of influenza from Nasopharyngeal swab specimens and should not be used as a sole basis for treatment. Nasal washings and aspirates are unacceptable for Xpert Xpress SARS-CoV-2/FLU/RSV testing.  Fact Sheet for Patients: EntrepreneurPulse.com.au  Fact Sheet for Healthcare Providers: IncredibleEmployment.be  This test is not yet approved or cleared by the [place] FDA and has been authorized for detection and/or diagnosis of SARS-CoV-2  by FDA under an Emergency Use Authorization (EUA). This EUA will remain in effect (meaning this test can be used) for the duration of the COVID-19 declaration under Section 564(b)(1) of the Act, 21 U.S.C. section 360bbb-3(b)(1), unless the authorization is terminated or revoked.  Performed at [facility], [place] [address] [place], [place] [address]   Urine culture     Status: Abnormal   Collection Time: 11/24/20  8:30 PM   Specimen: In/Out Cath Urine  Result Value Ref Range Status   Specimen Description   Final    IN/OUT CATH URINE Performed at [facility] [address] [place], [place] [address]    Special Requests   Final    NONE Performed at [facility], [place] [address], [place], [place] [address]    Culture 60,000 COLONIES/mL ENTEROBACTER SPECIES (A)  Final   Report Status 11/27/2020 FINAL  Final   Organism ID, Bacteria  ENTEROBACTER SPECIES (A)  Final      Susceptibility   Enterobacter species - MIC*    CEFAZOLIN >=64 RESISTANT Resistant     CEFEPIME <=0.12 SENSITIVE Sensitive     CEFTRIAXONE <=0.25 SENSITIVE Sensitive     CIPROFLOXACIN <=0.25 SENSITIVE Sensitive     GENTAMICIN <=1 SENSITIVE Sensitive     IMIPENEM <=0.25 SENSITIVE Sensitive     NITROFURANTOIN <=16 SENSITIVE Sensitive     TRIMETH/SULFA <=20 SENSITIVE Sensitive     PIP/TAZO <=4 SENSITIVE Sensitive     * 60,000 COLONIES/mL ENTEROBACTER SPECIES  Culture, blood (routine x 2)     Status: None (Preliminary result)   Collection Time: 11/27/20  5:37 AM   Specimen: BLOOD LEFT ARM  Result Value Ref Range Status   Specimen Description   Final    BLOOD LEFT ARM Performed at [facility] [address] [place], [place] [address]    Special Requests   Final    BOTTLES DRAWN AEROBIC ONLY Blood Culture adequate volume Performed at [facility] [address], [place], [place] [address]    Culture   Final    NO  GROWTH 4 DAYS Performed at [facility], [place] [address] [place], [place] [address]    Report Status PENDING  Incomplete  Culture, blood (routine x 2)     Status: None (Preliminary result)   Collection Time: 11/27/20  5:37 AM   Specimen: BLOOD  Result Value Ref Range Status   Specimen Description   Final    BLOOD LEFT WRIST Performed at [facility] [address], [place], [place] [address]    Special Requests   Final    BOTTLES DRAWN AEROBIC ONLY Blood Culture adequate volume Performed at [facility] [address], [place], [place] [address]    Culture   Final    NO GROWTH 4 DAYS Performed at [facility], [place] [address], [place], [place] [address]    Report Status PENDING  Incomplete          Radiology Studies: No results found.      Scheduled Meds:  amLODipine  5 mg Oral Daily   Carbidopa-Levodopa ER  1.5 tablet Oral TID   feeding supplement  237 mL Oral BID BM   metoprolol tartrate  12.5 mg Oral BID   Continuous Infusions:  meropenem (MERREM) IV 1 g (12/01/20 0525)      LOS: 7 days    Time spent: 35 minutes.     Elmarie Shiley, MD [facility]   If 7PM-7AM, please contact night-coverage [email or web address]  12/01/2020, 12:02 PM

## 2020-12-01 NOTE — Progress Notes (Signed)
Daily Progress Note   Patient Name: [name]       Date: 12/01/2020 DOB: 1919/04/23  Age: 87 y.o. MRN#: [id number] Attending Physician: Elmarie Shiley, MD Primary Care Physician: Vernie Shanks, MD Admit Date: 11/24/2020  Reason for Consultation/Follow-up: Establishing goals of care  Subjective: I saw and examined [name] this evening.  He was sitting in bedside chair in no distress.  Discussed case earlier today with TOC and looking to see about potential for skilled rehab to see how much functional status he can regain.  Length of Stay: 7  Current Medications: Scheduled Meds:  . [START ON 12/02/2020] amLODipine  10 mg Oral Daily  . Carbidopa-Levodopa ER  1.5 tablet Oral TID  . feeding supplement  237 mL Oral BID BM  . metoprolol tartrate  12.5 mg Oral BID    Continuous Infusions: . meropenem (MERREM) IV 1 g (12/01/20 1934)    PRN Meds: acetaminophen **OR** acetaminophen, albuterol, antiseptic oral rinse, hydrALAZINE, HYDROmorphone (DILAUDID) injection, ondansetron **OR** ondansetron (ZOFRAN) IV, polyvinyl alcohol  Physical Exam      General: Remains frail, but does appear to be stronger than he was the end of last week HEENT: No bruits, no goiter, no JVD Heart: Regular rate and rhythm. No murmur appreciated. Lungs: Good air movement, clear Abdomen: Soft, nontender, nondistended, positive bowel sounds.   Ext: No significant edema.   Skin: Warm and dry  Vital Signs: BP (!) 168/68   Pulse 70   Temp 97.9 F (36.6 C) (Axillary)   Resp 16   Ht 5\' 6"  (1.676 m)   Wt 71.5 kg   SpO2 92%   BMI 25.44 kg/m  SpO2: SpO2: 92 % O2 Device: O2 Device: Room Air O2 Flow Rate: O2 Flow Rate (L/min): 2 L/min  Intake/output summary:  Intake/Output Summary (Last 24 hours) at  12/01/2020 2133 Last data filed at 12/01/2020 1700 Gross per 24 hour  Intake 820 ml  Output 1150 ml  Net -330 ml    LBM: Last BM Date: 11/28/20 Baseline Weight: Weight: 65.8 kg Most recent weight: Weight: 71.5 kg       Palliative Assessment/Data:    Flowsheet Rows    Flowsheet Row Most Recent Value  Intake Tab   Referral Department Hospitalist  Unit at Time of Referral Med/Surg Unit  Palliative  Care Primary Diagnosis Sepsis/Infectious Disease  Date Notified 11/25/20  Palliative Care Type New Palliative care  Reason for referral Clarify Goals of Care  Date of Admission 11/24/20  Date first seen by Palliative Care 11/26/20  # of days Palliative referral response time 1 Day(s)  # of days IP prior to Palliative referral 1  Clinical Assessment   Palliative Performance Scale Score 30%  Psychosocial & Spiritual Assessment   Palliative Care Outcomes   Patient/Family meeting held? Yes       Patient Active Problem List   Diagnosis Date Noted  . Acute cystitis 11/25/2020  . Acute metabolic encephalopathy 37/62/8315  . Lactic acidosis 11/25/2020  . Acute kidney injury superimposed on CKD ([place]) 11/25/2020  . Generalized weakness 11/25/2020  . Acute hyponatremia 11/25/2020  . Severe sepsis ([place]) 11/24/2020  . Sialorrhea 01/28/2016  . Memory disorder 01/28/2016  . Parkinson disease ([place]) 10/23/2015  . Gait disorder 02/24/2014  . Edema 09/28/2011  . Bronchospasm 08/18/2011  . Annual physical exam 03/31/2011  . RASH-NONVESICULAR 06/09/2010  . Anemia 12/30/2009  . DM2 (diabetes mellitus, type 2) ([place]) 09/01/2009  . ALLERGIC RHINITIS 08/20/2009  . NEOPLASM, MALIGNANT, SKIN 08/30/2007  . ADENOCARCINOMA, PROSTATE, HX OF 03/21/2007  . HLD (hyperlipidemia) 09/29/2006  . GOUT 09/29/2006  . HIATAL HERNIA 09/29/2006    Palliative Care Assessment & Plan   Patient Profile: [name] is a 87 year old male with past medical history of Parkinson's disease, prostate cancer status  post prostatectomy, CKD stage III who had a fall at home and was brought to the hospital.  He is very functional at baseline and they walk 7 blocks daily.  He is currently admitted with bacteremia secondary to UTI.  Palliative consulted for goals of care.  Recommendations/Plan: Continue current care. Family would like to see if he can make gains in functional status at skilled rehab.  Would also recommend palliative care to follow as an outpatient. Continue to follow.  Goals of Care and Additional Recommendations: Limitations on Scope of Treatment: Full Comfort Care  Code Status:    Code Status Orders  (From admission, onward)           Start     Ordered   11/28/20 1533  Do not attempt resuscitation (DNR)  Continuous       Question Answer Comment  In the event of cardiac or respiratory ARREST Do not call a "code blue"   In the event of cardiac or respiratory ARREST Do not perform Intubation, CPR, defibrillation or ACLS   In the event of cardiac or respiratory ARREST Use medication by any route, position, wound care, and other measures to relive pain and suffering. May use oxygen, suction and manual treatment of airway obstruction as needed for comfort.      11/28/20 1535           Code Status History     Date Active Date Inactive Code Status Order ID Comments User Context   11/27/2020 1214 11/28/2020 1535 DNR [id number]  Micheline Rough, MD Inpatient   11/24/2020 2130 11/27/2020 1213 Full Code [id number]  Howerter, Ethelda Chick, DO ED      Advance Directive Documentation    Flowsheet Row Most Recent Value  Type of Advance Directive Healthcare Power of Attorney, Living will  Pre-existing out of facility DNR order (yellow form or pink MOST form) --  "MOST" Form in Place? --       Prognosis: Guarded  Discharge Planning: To Be Determined  Care plan was  discussed with care management  Thank you for allowing the Palliative Medicine Team to assist in the care of this  patient.   Time In: 1920 Time Out: 1935 Total time 15 Prolonged Time Billed No   Greater than 50%  of this time was spent counseling and coordinating care related to the above assessment and plan.  Micheline Rough, MD  Please contact Palliative Medicine Team phone at [phone] for questions and concerns.

## 2020-12-01 NOTE — NC FL2 (Signed)
[place] MEDICAID FL2 LEVEL OF CARE SCREENING TOOL     IDENTIFICATION  Patient Name: [name] Birthdate: [birth year] Sex: male Admission Date (Current Location): 11/24/2020  [facility] and [place] Number:  Herbalist and Address:  [facility],  [address] [place], [place]      Provider Number: [id number]  Attending Physician Name and Address:  Elmarie Shiley, MD  Relative Name and Phone Number:  [phone] son Cortavious Nix    Current Level of Care: Hospital Recommended Level of Care: [place] Prior Approval Number:    Date Approved/Denied:   PASRR Number: Pending  Discharge Plan: SNF    Current Diagnoses: Patient Active Problem List   Diagnosis Date Noted   Acute cystitis 15/40/0867   Acute metabolic encephalopathy 61/95/0932   Lactic acidosis 11/25/2020   Acute kidney injury superimposed on CKD ([place]) 11/25/2020   Generalized weakness 11/25/2020   Acute hyponatremia 11/25/2020   Severe sepsis ([place]) 11/24/2020   Sialorrhea 01/28/2016   Memory disorder 01/28/2016   Parkinson disease ([place]) 10/23/2015   Gait disorder 02/24/2014   Edema 09/28/2011   Bronchospasm 08/18/2011   Annual physical exam 03/31/2011   RASH-NONVESICULAR 06/09/2010   Anemia 12/30/2009   DM2 (diabetes mellitus, type 2) ([place]) 09/01/2009   ALLERGIC RHINITIS 08/20/2009   NEOPLASM, MALIGNANT, SKIN 08/30/2007   ADENOCARCINOMA, PROSTATE, HX OF 03/21/2007   HLD (hyperlipidemia) 09/29/2006   GOUT 09/29/2006   HIATAL HERNIA 09/29/2006    Orientation RESPIRATION BLADDER Height & Weight     Self, Place  Normal Incontinent Weight: 71.5 kg Height:  5\' 6"  (167.6 cm)  BEHAVIORAL SYMPTOMS/MOOD NEUROLOGICAL BOWEL NUTRITION STATUS      Continent Diet (Dysphagia 3)  AMBULATORY STATUS COMMUNICATION OF NEEDS Skin   Extensive Assist Verbally Bruising                       Personal Care Assistance Level of Assistance  Bathing, Feeding, Dressing  Bathing Assistance: Maximum assistance Feeding assistance: Limited assistance Dressing Assistance: Maximum assistance     Functional Limitations Info  Sight, Hearing, Speech Sight Info: Impaired Hearing Info: Impaired Speech Info: Adequate (Spanish speaking)    SPECIAL CARE FACTORS FREQUENCY  OT (By licensed OT), PT (By licensed PT)     PT Frequency: 5 x weekly OT Frequency: 5 x weekly            Contractures Contractures Info: Not present    Additional Factors Info  Code Status, Allergies Code Status Info: DNR Allergies Info: No known allergies           Current Medications (12/01/2020):  This is the current hospital active medication list Current Facility-Administered Medications  Medication Dose Route Frequency Provider Last Rate Last Admin   acetaminophen (TYLENOL) tablet 650 mg  650 mg Oral Q6H PRN Micheline Rough, MD   650 mg at 11/30/20 2123   Or   acetaminophen (TYLENOL) suppository 650 mg  650 mg Rectal Q6H PRN Micheline Rough, MD       albuterol (PROVENTIL) (2.5 MG/3ML) 0.083% nebulizer solution 2.5 mg  2.5 mg Nebulization Q6H PRN Regalado, Belkys A, MD   2.5 mg at 11/28/20 1111   amLODipine (NORVASC) tablet 5 mg  5 mg Oral Daily Regalado, Belkys A, MD   5 mg at 11/30/20 1117   antiseptic oral rinse (BIOTENE) solution 15 mL  15 mL Topical PRN Micheline Rough, MD       Carbidopa-Levodopa ER (SINEMET CR) 25-100  MG tablet controlled release 1.5 tablet  1.5 tablet Oral TID Elodia Florence., MD   1.5 tablet at 11/30/20 2123   feeding supplement (ENSURE ENLIVE / ENSURE PLUS) liquid 237 mL  237 mL Oral BID BM Elodia Florence., MD   237 mL at 11/30/20 1806   hydrALAZINE (APRESOLINE) tablet 10 mg  10 mg Oral Q8H PRN Regalado, Belkys A, MD   10 mg at 12/01/20 0648   HYDROmorphone (DILAUDID) injection 0.5 mg  0.5 mg Intravenous Q2H PRN Micheline Rough, MD       meropenem (MERREM) 1 g in sodium chloride 0.9 % 100 mL IVPB  1 g Intravenous Q12H Adrian Saran, RPH 200  mL/hr at 12/01/20 0525 1 g at 12/01/20 0525   metoprolol tartrate (LOPRESSOR) tablet 12.5 mg  12.5 mg Oral BID Regalado, Belkys A, MD   12.5 mg at 11/30/20 2123   ondansetron (ZOFRAN-ODT) disintegrating tablet 4 mg  4 mg Oral Q6H PRN Micheline Rough, MD       Or   ondansetron [facility]) injection 4 mg  4 mg Intravenous Q6H PRN Micheline Rough, MD       polyvinyl alcohol (LIQUIFILM TEARS) 1.4 % ophthalmic solution 1 drop  1 drop Both Eyes QID PRN Micheline Rough, MD         Discharge Medications: Please see discharge summary for a list of discharge medications.  Relevant Imaging Results:  Relevant Lab Results:   Additional Information ss# [id number]  Covid Vaccinations: Pfizer 07/28/19, 08/20/19  Tandy Grawe, Marjie Skiff, RN

## 2020-12-01 NOTE — Progress Notes (Signed)
Daily Progress Note   Patient Name: [name]       Date: 12/01/2020 DOB: [birth year]  Age: 87 y.o. MRN#: [id number] Attending Physician: Elmarie Shiley, MD Primary Care Physician: Vernie Shanks, MD Admit Date: 11/24/2020  Reason for Consultation/Follow-up: Establishing goals of care  Subjective: I met today with [name], his wife, and their son and daughter.  He was awake and alert this evening.  Family reports that he was very worn out at work, but he did participate with rehab including standing by the bedside 4 times.  We discussed options for his care moving forward.  Yesterday, decision was made to restart antibiotics and evaluate today to see if it looks like he has more suited to trial rehab versus transition to a family member's home for hospice care.  Family is in agreement that a good plan would be to plan to transition to rehab for trial of rehab. He has done well with rehabbing in the past. If he does well at home and continues to thrive, I encouraged they continue with this plan. If, however he is unable to regain function and he continues to decline, I recommended that they be followed by outpatient palliative care to determine if he may be better served by focusing his care on staying at home with support of organization such as hospice.   Length of Stay: 7  Current Medications: Scheduled Meds:  . amLODipine  5 mg Oral Daily  . Carbidopa-Levodopa ER  1.5 tablet Oral TID  . feeding supplement  237 mL Oral BID BM  . metoprolol tartrate  12.5 mg Oral BID    Continuous Infusions: . meropenem (MERREM) IV 1 g (12/01/20 0525)    PRN Meds: acetaminophen **OR** acetaminophen, albuterol, antiseptic oral rinse, hydrALAZINE, HYDROmorphone (DILAUDID) injection,  ondansetron **OR** ondansetron (ZOFRAN) IV, polyvinyl alcohol  Physical Exam      General: Frail and weak appearing HEENT: No bruits, no goiter, no JVD Heart: Regular rate and rhythm. No murmur appreciated. Lungs: Good air movement, clear Abdomen: Soft, nontender, nondistended, positive bowel sounds.   Ext: No significant edema.  Muscle wasting noted Skin: Warm and dry  Vital Signs: BP (!) 194/79 (BP Location: Left Arm)   Pulse 83   Temp 98 F (36.7 C) (Oral)   Resp 20   Ht  $'5\' 6"'A$  (1.676 m)   Wt 71.5 kg   SpO2 92%   BMI 25.44 kg/m  SpO2: SpO2: 92 % O2 Device: O2 Device: Room Air O2 Flow Rate: O2 Flow Rate (L/min): 2 L/min  Intake/output summary:  Intake/Output Summary (Last 24 hours) at 12/01/2020 0746 Last data filed at 12/01/2020 2947 Gross per 24 hour  Intake 2094 ml  Output 850 ml  Net 1244 ml    LBM: Last BM Date: 11/28/20 Baseline Weight: Weight: 65.8 kg Most recent weight: Weight: 71.5 kg       Palliative Assessment/Data:    Flowsheet Rows    Flowsheet Row Most Recent Value  Intake Tab   Referral Department Hospitalist  Unit at Time of Referral Med/Surg Unit  Palliative Care Primary Diagnosis Sepsis/Infectious Disease  Date Notified 11/25/20  Palliative Care Type New Palliative care  Reason for referral Clarify Goals of Care  Date of Admission 11/24/20  Date first seen by Palliative Care 11/26/20  # of days Palliative referral response time 1 Day(s)  # of days IP prior to Palliative referral 1  Clinical Assessment   Palliative Performance Scale Score 30%  Psychosocial & Spiritual Assessment   Palliative Care Outcomes   Patient/Family meeting held? Yes       Patient Active Problem List   Diagnosis Date Noted  . Acute cystitis 11/25/2020  . Acute metabolic encephalopathy 65/46/5035  . Lactic acidosis 11/25/2020  . Acute kidney injury superimposed on CKD ([place]) 11/25/2020  . Generalized weakness 11/25/2020  . Acute hyponatremia 11/25/2020  .  Severe sepsis ([place]) 11/24/2020  . Sialorrhea 01/28/2016  . Memory disorder 01/28/2016  . Parkinson disease ([place]) 10/23/2015  . Gait disorder 02/24/2014  . Edema 09/28/2011  . Bronchospasm 08/18/2011  . Annual physical exam 03/31/2011  . RASH-NONVESICULAR 06/09/2010  . Anemia 12/30/2009  . DM2 (diabetes mellitus, type 2) ([place]) 09/01/2009  . ALLERGIC RHINITIS 08/20/2009  . NEOPLASM, MALIGNANT, SKIN 08/30/2007  . ADENOCARCINOMA, PROSTATE, HX OF 03/21/2007  . HLD (hyperlipidemia) 09/29/2006  . GOUT 09/29/2006  . HIATAL HERNIA 09/29/2006    Palliative Care Assessment & Plan   Patient Profile: [name] is a 87 year old male with past medical history of Parkinson's disease, prostate cancer status post prostatectomy, CKD stage III who had a fall at home and was brought to the hospital.  He is very functional at baseline and they walk 7 blocks daily.  He is currently admitted with bacteremia secondary to UTI.  Palliative consulted for goals of care.  Recommendations/Plan: Continue current care. Family would like to work to transition to skilled facility for trial of rehab.  If he is unable to participate and growing weaker, they would be open to transition to hospice care.  Due to the fact that he has been increasing his appetite and asking to get out of bed, they feel that he needs to have the opportunity to improve if it is possible. Continue to follow.  Goals of Care and Additional Recommendations: Limitations on Scope of Treatment: Full Comfort Care  Code Status:    Code Status Orders  (From admission, onward)           Start     Ordered   11/28/20 1533  Do not attempt resuscitation (DNR)  Continuous       Question Answer Comment  In the event of cardiac or respiratory ARREST Do not call a "code blue"   In the event of cardiac or respiratory ARREST Do not perform Intubation, CPR, defibrillation  or ACLS   In the event of cardiac or respiratory ARREST Use medication by any  route, position, wound care, and other measures to relive pain and suffering. May use oxygen, suction and manual treatment of airway obstruction as needed for comfort.      11/28/20 1535           Code Status History     Date Active Date Inactive Code Status Order ID Comments User Context   11/27/2020 1214 11/28/2020 1535 DNR [id number]  Micheline Rough, MD Inpatient   11/24/2020 2130 11/27/2020 1213 Full Code [id number]  Howerter, Ethelda Chick, DO ED      Advance Directive Documentation    Flowsheet Row Most Recent Value  Type of Advance Directive Healthcare Power of Attorney, Living will  Pre-existing out of facility DNR order (yellow form or pink MOST form) --  "MOST" Form in Place? --       Prognosis: Guarded  Discharge Planning: To Be Determined  Care plan was discussed with son, wife, bedside RN, Dr. Tyrell Antonio  Thank you for allowing the Palliative Medicine Team to assist in the care of this patient.   Time In: 1800 Time Out: 1840 Total time 40 Prolonged Time Billed No   Greater than 50%  of this time was spent counseling and coordinating care related to the above assessment and plan.   Micheline Rough, MD  Please contact Palliative Medicine Team phone at [phone] for questions and concerns.

## 2020-12-01 NOTE — Care Management Important Message (Signed)
Important Message  Patient Details IM Letter given to the Patient. Name: [name] MRN: [id number] Date of Birth: March 10, 1919   Medicare Important Message Given:  Yes     Kerin Salen 12/01/2020, 11:05 AM

## 2020-12-01 NOTE — Progress Notes (Signed)
Occupational Therapy Treatment Patient Details Name: [name] MRN: [id number] DOB: 1918-09-16 Today's Date: 12/01/2020    History of present illness [name] is a 87 y.o. male admitted for severe sepsis due to urinary tract infection after presenting from home to [facility] ED evaluation of confusion. PMH: Parkinson's disease, prostate cancer s/p radioactive beads and prostatectomy, CKD 3A   OT comments  Patient demonstrates improved ability to participate in functional mobility and ADLs as well as  improved tolerance. Patient max assist to transfer to side of bed. Improved sitting balance and able to drink from cup. +2 assistance needed to transfer to recliner. Patient able to wash face sitting in recliner. Patient pleasant and cooperative. Patient demonstrates ability to participate in therapy. Recommend short term rehab at discharge at this time.    Follow Up Recommendations  SNF    Equipment Recommendations  Other (comment) (TBD)    Recommendations for Other Services      Precautions / Restrictions Precautions Precautions: Fall Precaution Comments: monitor HR Restrictions Weight Bearing Restrictions: No       Mobility Bed Mobility Overal bed mobility: Needs Assistance Bed Mobility: Supine to Sit     Supine to sit: Max assist;HOB elevated     General bed mobility comments: Max assist for transfer to edge of bed - needing assistance for LEs, trunk negotiation and use of bed pivot to pivot hips to edge of bed.    Transfers Overall transfer level: Needs assistance Equipment used: 2 person hand held assist Transfers: Sit to/from [facility] Sit to Stand: Max assist;+2 safety/equipment;From elevated surface Stand pivot transfers: Mod assist;+2 physical assistance;+2 safety/equipment       General transfer comment: Max assist to stand from bed with +2 assistance. Mod x 2 to take steps to recliner. Did not fully stand up with pivot.    Balance Overall  balance assessment: Needs assistance Sitting-balance support: Feet supported;Bilateral upper extremity supported Sitting balance-Leahy Scale: Fair     Standing balance support: Bilateral upper extremity supported Standing balance-Leahy Scale: Poor                             ADL either performed or assessed with clinical judgement   ADL Overall ADL's : Needs assistance/impaired Eating/Feeding: Set up Eating/Feeding Details (indicate cue type and reason): Initially HOH to assist with drinking with straw from cup while in bed. At edge of bed patient able to perform with set up. Grooming: Set up;Wash/dry face Grooming Details (indicate cue type and reason): Patient able to wash face in recliner.                                     Vision       Perception     Praxis      Cognition Arousal/Alertness: Awake/alert Behavior During Therapy: WFL for tasks assessed/performed Overall Cognitive Status: Difficult to assess                                          Exercises     Shoulder Instructions       General Comments      Pertinent Vitals/ Pain       Pain Assessment: No/denies pain  Home Living  Prior Functioning/Environment              Frequency  Min 2X/week        Progress Toward Goals  OT Goals(current goals can now be found in the care plan section)  Progress towards OT goals: Progressing toward goals  Acute Rehab OT Goals Patient Stated Goal: Improve abilities OT Goal Formulation: With family Time For Goal Achievement: 12/12/20 Potential to Achieve Goals: [place] Discharge plan needs to be updated    Co-evaluation          OT goals addressed during session: ADL's and self-care;Other (comment) (functional mobility)      AM-PAC OT "6 Clicks" Daily Activity     Outcome Measure   Help from another person eating meals?: A Little Help  from another person taking care of personal grooming?: A Little Help from another person toileting, which includes using toliet, bedpan, or urinal?: A Lot Help from another person bathing (including washing, rinsing, drying)?: A Lot Help from another person to put on and taking off regular upper body clothing?: A Lot Help from another person to put on and taking off regular lower body clothing?: Total 6 Click Score: 13    End of Session    OT Visit Diagnosis: Unsteadiness on feet (R26.81);Muscle weakness (generalized) (M62.81);Pain;History of falling (Z91.81);Other symptoms and signs involving cognitive function   Activity Tolerance Patient tolerated treatment well   Patient Left in chair;with call bell/phone within reach;with family/visitor present   Nurse Communication Mobility status        Time: 1550-1616 OT Time Calculation (min): 26 min  Charges: OT General Charges $OT Visit: 1 Visit OT Treatments $Self Care/Home Management : 8-22 mins $Therapeutic Activity: 8-22 mins  Karen Kinnard, OTR/L [place]  Office [phone] Pager: [place] 12/01/2020, 5:08 PM

## 2020-12-02 DIAGNOSIS — R6520 Severe sepsis without septic shock: Secondary | ICD-10-CM | POA: Diagnosis not present

## 2020-12-02 DIAGNOSIS — R531 Weakness: Secondary | ICD-10-CM

## 2020-12-02 DIAGNOSIS — Z515 Encounter for palliative care: Secondary | ICD-10-CM | POA: Diagnosis not present

## 2020-12-02 DIAGNOSIS — Z7189 Other specified counseling: Secondary | ICD-10-CM | POA: Diagnosis not present

## 2020-12-02 DIAGNOSIS — A419 Sepsis, unspecified organism: Secondary | ICD-10-CM | POA: Diagnosis not present

## 2020-12-02 LAB — CULTURE, BLOOD (ROUTINE X 2)
Culture: NO GROWTH
Culture: NO GROWTH
Special Requests: ADEQUATE
Special Requests: ADEQUATE

## 2020-12-02 LAB — BASIC METABOLIC PANEL
Anion gap: 4 — ABNORMAL LOW (ref 5–15)
BUN: 48 mg/dL — ABNORMAL HIGH (ref 8–23)
CO2: 28 mmol/L (ref 22–32)
Calcium: 8.1 mg/dL — ABNORMAL LOW (ref 8.9–10.3)
Chloride: 109 mmol/L (ref 98–111)
Creatinine, Ser: 1.37 mg/dL — ABNORMAL HIGH (ref 0.61–1.24)
GFR, Estimated: 46 mL/min — ABNORMAL LOW (ref >60)
Glucose, Bld: 105 mg/dL — ABNORMAL HIGH (ref 70–99)
Potassium: 4.5 mmol/L (ref 3.5–5.1)
Sodium: 141 mmol/L (ref 135–145)

## 2020-12-02 NOTE — Progress Notes (Signed)
OT Cancellation Note  Patient Details Name: [name] MRN: [id number] DOB: 05/19/1919   Cancelled Treatment:    Reason Eval/Treat Not Completed: Other (comment). Per chart review and PT - patient's family has chosen to pursue comfort care. OT will sign off. Please re-order if GOC change.  [place] [facility] [place] 12/02/2020, 2:51 PM

## 2020-12-02 NOTE — Progress Notes (Signed)
I provided follow up care to [facility] and his family.  They were grateful that the priest had been able to come.  [name] was able to express clearly that he is ready to let go and wants to be "en [place]" (in heaven).  The family is now readjusting their goals of care and are interested in discussing comfort care with the palliative team.  I provided presence and prayer at the family's request.  Chaplain Janne Napoleon, Bcc Pager, [phone] 11:59 AM

## 2020-12-02 NOTE — Progress Notes (Signed)
[facility] ([facility] Hospital Liaison note.     Received request from TOC manager for family interest in [facility]. Patient information has been forwarded to [facility] for review.  [facility] liaison will follow up once eligibility and availability have been determined.   A Please do not hesitate to call with questions.    Thank you,   Mary Anne Robertson, RN, CCM      [facility] Hospital Liaison  [phone] 

## 2020-12-02 NOTE — Plan of Care (Signed)
Pt helped from chair to bed with mechanical help (steady) and two persons assist; pt slept through the night and moaned in his sleep; no s/s of acute distress or pain reported or observed; call light within reach, bed at lowest position and door remained open for safety and close monitoring.

## 2020-12-02 NOTE — Progress Notes (Signed)
PHYSICAL THERAPY  Per chart review and discussion with RN, pt now "Comfort Care".  Will consult LPT to discontinue Acute Care Physical Therapy.  Rica Koyanagi  PTA Acute  Rehabilitation Services Pager      [phone] Office      [phone]

## 2020-12-02 NOTE — Progress Notes (Signed)
Daily Progress Note   Patient Name: [name]       Date: 12/02/2020 DOB: 09/24/1918  Age: 87 y.o. MRN#: [id number] Attending Physician: [name] Primary Care Physician: [name] Admit Date: 11/24/2020  Reason for Consultation/Follow-up: Establishing goals of care  Subjective: Patient is resting in bed, has some peripheral edema, awake, reasonably alert. Patient appears with generalized weakness and ongoing deconditioning, he has minimal PO intake. Wife and son at bedside.     Length of Stay: 8  Current Medications: Scheduled Meds:  . amLODipine  10 mg Oral Daily  . Carbidopa-Levodopa ER  1.5 tablet Oral TID  . feeding supplement  237 mL Oral BID BM  . metoprolol tartrate  12.5 mg Oral BID    Continuous Infusions: . meropenem (MERREM) IV 1 g (12/02/20 0546)    PRN Meds: acetaminophen **OR** acetaminophen, albuterol, antiseptic oral rinse, hydrALAZINE, HYDROmorphone (DILAUDID) injection, ondansetron **OR** ondansetron (ZOFRAN) IV, polyvinyl alcohol  Physical Exam      General: Remains frail,   HEENT: No bruits, no goiter, no JVD Heart: Regular rate and rhythm. No murmur appreciated. Lungs: shallow Abdomen: Soft, nontender, nondistended, positive bowel sounds.   Ext: No significant edema.   Skin: Warm and dry  Vital Signs: BP (!) 165/78 (BP Location: Left Arm)   Pulse 69   Temp (!) 97.4 F (36.3 C) (Oral)   Resp 17   Ht 5' 6"  (1.676 m)   Wt 70.3 kg   SpO2 95%   BMI 25.01 kg/m  SpO2: SpO2: 95 % O2 Device: O2 Device: Room Air O2 Flow Rate: O2 Flow Rate (L/min): 2 L/min  Intake/output summary:  Intake/Output Summary (Last 24 hours) at 12/02/2020 1103 Last data filed at 12/02/2020 9795 Gross per 24 hour  Intake 600 ml  Output 2150 ml  Net  -1550 ml    LBM: Last BM Date: 11/28/20 Baseline Weight: Weight: 65.8 kg Most recent weight: Weight: 70.3 kg       Palliative Assessment/Data:    Flowsheet Rows    Flowsheet Row Most Recent Value  Intake Tab   Referral Department Hospitalist  Unit at Time of Referral Med/Surg Unit  Palliative Care Primary Diagnosis Sepsis/Infectious Disease  Date Notified 11/25/20  Palliative Care Type New Palliative care  Reason for referral Clarify Goals of  Care  Date of Admission 11/24/20  Date first seen by Palliative Care 11/26/20  # of days Palliative referral response time 1 Day(s)  # of days IP prior to Palliative referral 1  Clinical Assessment   Palliative Performance Scale Score 30%  Psychosocial & Spiritual Assessment   Palliative Care Outcomes   Patient/Family meeting held? Yes       Patient Active Problem List   Diagnosis Date Noted  . Acute cystitis 11/25/2020  . Acute metabolic encephalopathy 44/97/5300  . Lactic acidosis 11/25/2020  . Acute kidney injury superimposed on CKD ([place]) 11/25/2020  . Generalized weakness 11/25/2020  . Acute hyponatremia 11/25/2020  . Severe sepsis ([place]) 11/24/2020  . Sialorrhea 01/28/2016  . Memory disorder 01/28/2016  . Parkinson disease ([place]) 10/23/2015  . Gait disorder 02/24/2014  . Edema 09/28/2011  . Bronchospasm 08/18/2011  . Annual physical exam 03/31/2011  . RASH-NONVESICULAR 06/09/2010  . Anemia 12/30/2009  . DM2 (diabetes mellitus, type 2) ([place]) 09/01/2009  . ALLERGIC RHINITIS 08/20/2009  . NEOPLASM, MALIGNANT, SKIN 08/30/2007  . ADENOCARCINOMA, PROSTATE, HX OF 03/21/2007  . HLD (hyperlipidemia) 09/29/2006  . GOUT 09/29/2006  . HIATAL HERNIA 09/29/2006    Palliative Care Assessment & Plan   Patient Profile: [name] is a 87 year old male with past medical history of Parkinson's disease, prostate cancer status post prostatectomy, CKD stage III who had a fall at home and was brought to the hospital.  He is very  functional at baseline and they walk 7 blocks daily.  He is currently admitted with bacteremia secondary to UTI.  Palliative consulted for goals of care.  Recommendations/Plan: Family meeting held, patient stated that he feels comfortable with his family translating for him.  Wife and son present at bedside.  Patient states that he has completed all of his unfinished business and has also met with chaplain, is spiritually at peace and wishes for comfort measures and is asking about options for hospice.  Discussed with patient wife and son about full scope of comfort measures, discontinuation of antibiotics, options for residential hospice.  Patient has generalized weakness deconditioning, generalized anasarca, likely will have escalating symptom burden in the coming few days.  Minimal oral intake.  Prognosis likely 2 weeks in my opinion.  Will discuss with TOC.     Goals of Care and Additional Recommendations: Limitations on Scope of Treatment: Full Comfort Care  Code Status:    Code Status Orders  (From admission, onward)           Start     Ordered   11/28/20 1533  Do not attempt resuscitation (DNR)  Continuous       Question Answer Comment  In the event of cardiac or respiratory ARREST Do not call a "code blue"   In the event of cardiac or respiratory ARREST Do not perform Intubation, CPR, defibrillation or ACLS   In the event of cardiac or respiratory ARREST Use medication by any route, position, wound care, and other measures to relive pain and suffering. May use oxygen, suction and manual treatment of airway obstruction as needed for comfort.      11/28/20 1535           Code Status History     Date Active Date Inactive Code Status Order ID Comments User Context   11/27/2020 1214 11/28/2020 1535 DNR [id number]  Timothy Rough, Cardenas Inpatient   11/24/2020 2130 11/27/2020 1213 Full Code [id number]  Cardenas, Timothy Chick, DO ED      Advance  Directive Documentation    Flowsheet Row  Most Recent Value  Type of Advance Directive Healthcare Power of Attorney, Living will  Pre-existing out of facility DNR order (yellow form or pink MOST form) --  "MOST" Form in Place? --       Prognosis: Less than 2 weeks.   Discharge Planning: Likely residential hospice.  Care plan was discussed with patient, wife, son and [facility] colleague [name].   Thank you for allowing the Palliative Medicine Team to assist in the care of this patient.   Time In: 10.30 Time Out: 11.05 Total time 35 Prolonged Time Billed No   Greater than 50%  of this time was spent counseling and coordinating care related to the above assessment and plan.  Loistine Chance, Cardenas  Please contact Palliative Medicine Team phone at [phone] for questions and concerns.

## 2020-12-02 NOTE — Progress Notes (Signed)
Pharmacy Antibiotic Note  [name] is a 87 y.o. male admitted on 11/24/2020 with Enterobacter asburiae bacteremia, likely 2/2 UTI.  Pharmacy has been consulted for meropenem dosing. Patient was previously on cefepime, transitioned to meropenem on 6/18 - both provide adequate coverage.  Today, 12/02/20 WBC WNL Afebrile SCr = 1.45, CrCl ~25 mL/min  This is day #8 of total antibiotics; Day #4 meropenem.   Plan: Continue meropenem 1 g IV q12h Monitor renal function  Height: 5\' 6"  (167.6 cm) Weight: 70.3 kg (154 lb 15.7 oz) IBW/kg (Calculated) : 63.8  Temp (24hrs), Avg:97.7 F (36.5 C), Min:97.4 F (36.3 C), Max:97.9 F (36.6 C)  Recent Labs  Lab 11/25/20 1457 11/27/20 0537 11/28/20 0539 11/30/20 0643 12/01/20 1021  WBC 15.0* 6.2 6.9 10.3  --   CREATININE 1.86* 1.68* 1.66* 1.32* 1.45*    Estimated Creatinine Clearance: 25.1 mL/min (A) (by C-G formula based on SCr of 1.45 mg/dL (H)).    No Known Allergies  Antimicrobials this admission: cefepime 6/13 >> 6/16 meropenem 6/18 >>   Dose adjustments this admission:  Microbiology results: 6/16 BCx: ngF 6/13 BCx: enterobacter asburiae (R cefazolin); 1/4 staph epi (contaminant) 6/13 UCx: Enterobacter species (R cefazolin)    Thank you for allowing pharmacy to be a part of this patient's care.  Lenis Noon, PharmD 12/02/2020 8:32 AM

## 2020-12-02 NOTE — Progress Notes (Signed)
PROGRESS NOTE    [name]  IWP:[id number] DOB: Nov 20, 1918 DOA: 11/24/2020 PCP: Vernie Shanks, MD   Brief Narrative: 87 year old with past medical history significant for Parkinson's disease, prostate cancer, history of prostatectomy, CKD stage IIIa, baseline creatinine around 1.4, who presents status post fall at home and wife called EMS and brought him to the hospital. At baseline patient was able to walk 7 blocks daily.  She was concerned as patient was becoming more and more weak, generalized weakness, altered mental status, increased somnolence.  Denies history of fever, chills, cough, nausea, vomiting, diarrhea.  She also noticed his oral intake was poor.  Patient was found to have Enterobacter Asburiae bacteremia from urinary tract infection, he presented with sepsis.  He was treated with IV fluids and IV antibiotics.  He received a total of 8 days of IV antibiotics.  Antibiotics stopped on 6/21, because  family and patient wishes to proceed with full comfort care.  Patient also developed acute hypoxic respiratory failure likely related to  hypoventilation.  Patient also suffering from failure to thrive secondary to sepsis and infection.  Patient was also diagnosed with  A. Fib.  After discussion with family decision was made to proceed with anticoagulation.  Patient wants to proceed with comfort care.  Plan is for residential hospice.    Assessment & Plan:   Principal Problem:   Severe sepsis ([place]) Active Problems:   DM2 (diabetes mellitus, type 2) (HCC)   HLD (hyperlipidemia)   Anemia   Parkinson disease ([place])   Acute cystitis   Acute metabolic encephalopathy   Lactic acidosis   Acute kidney injury superimposed on CKD ([place])   Generalized weakness   Acute hyponatremia  1-Enterobacter Asburiae Bacteremia likely secondary to UTI, Sepsis:  -Patient presents  with fever,  Temperature 102, Tachypnea, Respiration rate 36, leukocytosis white blood cell 15, confusion,  thrombocytopenia. -Blood Cultures: Positive for Enterobacter. -Treated with cefepime from 6/13 until 6/17. Antibiotics change to Imipenem to avoid neurotoxicity form cefepime.  -Imipenem started 6/18, avoid cefepime to avoid neurotoxicity. Received 8 days of  antibiotics.  -Repeated Blood culture 6/16 no growth to date.  -Patient has been transition to Comfort care.   2-Acute metabolic encephalopathy, secondary to sepsis/GNR bacteremia; -CT head: No acute intracranial process. -Secondary to Infectious process -TSH was normal, ammonia level was negative.  -Patient is alert and oriented. Not confuse.  3-Failure to Thrive:  Suspect this is related to sepsis on top of underlying Parkinson's disease and fragility/  I am concern  with his ability of regaining his strength after his hospitalization.  -TSH was normal, Ammonia normal, Mg Normal.  - Cortisol level and B 12 level. Not low.  -plan to transition to comfort care.  -Patient eating less today.   4-A fib RVR;  Secondary to fever and rigors 6/14. ECHO Normal EF.  Decision was made to hold on anticoagulation due to thrombocytopenia and advance age.  Started low dose metoprolol.   5-Acute Hypoxic Respiratory failure;  Suspect related to hypoventilation, question if aspiration.  Concern for sleep apnea. Might need oxygen while sleep.  Nebulizer treatments order PRN.   6-Elevated troponin;  Demand ischemia related to sepsis.  Troponin 63--75--38  7-Mild Hyponatremia; Received IV fluids.  DM type 2: HbA1c 5.2.  One out of two Blood culture positive for Staph epidermidis; Likely Contaminate.  Repeated blood culture no growth to date.   Parkinson Diseases:  Continue with Levodopa and carbidopa.   AKI on CKD stage a;  CR baseline 1.4 Improved with IV fluids. Cr peak to 2.0 Down to 1.3--1.4  HTN; Continue with Norvasc and metoprolol.  Increase Norvasc to 10 mg  HLD; on statins.   Dysphagia; on Dysphagia 3 diet.   Anemia;  started iron supplement.  Thrombocytopenia; in setting of infectious process. improved  Goals of care.  Plan for residential Hospice.   Nutrition Problem: Inadequate oral intake Etiology: lethargy/confusion    Signs/Symptoms: per patient/family report    Interventions: Ensure Enlive (each supplement provides 350kcal and 20 grams of protein), Magic cup  Estimated body mass index is 25.01 kg/m as calculated from the following:   Height as of this encounter: 5\' 6"  (1.676 m).   Weight as of this encounter: 70.3 kg.   DVT prophylaxis: SCD Code Status: DNR Family Communication: Son and wife  who were  at bedside.  Disposition Plan:  Status is: Inpatient  Remains inpatient appropriate because:IV treatments appropriate due to intensity of illness or inability to take PO  Dispo: The patient is from: Home              Anticipated d/c is to:  Residential Hospice.               Patient currently is not medically stable to d/c.   Difficult to place patient No        Consultants:  Palliative care  Procedures:  ECHO Normal EF  Antimicrobials:    Subjective: He is alert, he wants to go to heaven. He wants to proceed with comfort care.   Objective: Vitals:   12/01/20 1330 12/01/20 1333 12/01/20 1334 12/02/20 0557  BP: (!) 168/68 (!) 168/68 (!) 168/68 (!) 165/78  Pulse: 70  70 69  Resp: 16   17  Temp: 97.9 F (36.6 C)   (!) 97.4 F (36.3 C)  TempSrc: Axillary   Oral  SpO2:    95%  Weight:    70.3 kg  Height:        Intake/Output Summary (Last 24 hours) at 12/02/2020 1606 Last data filed at 12/02/2020 9323 Gross per 24 hour  Intake 240 ml  Output 1850 ml  Net -1610 ml    Filed Weights   11/30/20 0621 12/01/20 0457 12/02/20 0557  Weight: 66.7 kg 71.5 kg 70.3 kg    Examination:  General exam: Alert, frail chornic ill appearing Respiratory system: CTA Cardiovascular system:  S 1, S 2 RRR Gastrointestinal system: BS present, soft, nt Central nervous  system: Alert Extremities: No edema   Data Reviewed: I have personally reviewed following labs and imaging studies  CBC: Recent Labs  Lab 11/27/20 0537 11/28/20 0539 11/30/20 0643  WBC 6.2 6.9 10.3  NEUTROABS 5.5 5.6  --   HGB 8.0* 7.9* 8.7*  HCT 24.3* 24.4* 25.9*  MCV 100.4* 100.8* 99.2  PLT 111* 107* 147*    Basic Metabolic Panel: Recent Labs  Lab 11/26/20 2034 11/27/20 0537 11/28/20 0539 11/30/20 0643 12/01/20 1021 12/02/20 0858  NA  --  139 142 143 143 141  K  --  3.6 3.6 3.7 4.2 4.5  CL  --  109 111 111 109 109  CO2  --  22 24 27 29 28   GLUCOSE  --  127* 138* 135* 166* 105*  BUN  --  61* 55* 48* 53* 48*  CREATININE  --  1.68* 1.66* 1.32* 1.45* 1.37*  CALCIUM  --  7.8* 7.8* 8.2* 8.4* 8.1*  MG  --  2.3 2.4  --   --   --  PHOS 3.8 3.6  --   --   --   --     GFR: Estimated Creatinine Clearance: 26.5 mL/min (A) (by C-G formula based on SCr of 1.37 mg/dL (H)). Liver Function Tests: Recent Labs  Lab 11/27/20 0537 11/28/20 0539  AST 22 25  ALT 18 <5  ALKPHOS 52 50  BILITOT 0.6 0.3  PROT 5.7* 5.4*  ALBUMIN 2.3* 2.0*    No results for input(s): LIPASE, AMYLASE in the last 168 hours. No results for input(s): AMMONIA in the last 168 hours.  Coagulation Profile: No results for input(s): INR, PROTIME in the last 168 hours.  Cardiac Enzymes: No results for input(s): CKTOTAL, CKMB, CKMBINDEX, TROPONINI in the last 168 hours. BNP (last 3 results) No results for input(s): PROBNP in the last 8760 hours. HbA1C: No results for input(s): HGBA1C in the last 72 hours. CBG: Recent Labs  Lab 11/27/20 1748 11/28/20 0003 11/28/20 0613 11/28/20 1150 11/29/20 0016  GLUCAP 137* 187* 129* 112* 152*    Lipid Profile: No results for input(s): CHOL, HDL, LDLCALC, TRIG, CHOLHDL, LDLDIRECT in the last 72 hours. Thyroid Function Tests: No results for input(s): TSH, T4TOTAL, FREET4, T3FREE, THYROIDAB in the last 72 hours.  Anemia Panel: No results for input(s):  VITAMINB12, FOLATE, FERRITIN, TIBC, IRON, RETICCTPCT in the last 72 hours.  Sepsis Labs: No results for input(s): PROCALCITON, LATICACIDVEN in the last 168 hours.   Recent Results (from the past 240 hour(s))  Culture, blood (Routine x 2)     Status: Abnormal   Collection Time: 11/24/20  5:04 PM   Specimen: BLOOD RIGHT ARM  Result Value Ref Range Status   Specimen Description   Final    BLOOD RIGHT ARM Performed at [facility], [address] [place], [place] [address]    Special Requests   Final    BOTTLES DRAWN AEROBIC AND ANAEROBIC Blood Culture adequate volume Performed at [facility] [address] [place], [place] [address]    Culture  Setup Time   Final    GRAM NEGATIVE RODS IN BOTH AEROBIC AND ANAEROBIC BOTTLES Organism ID to follow CRITICAL RESULT CALLED TO, READ BACK BY AND VERIFIED WITH: PHARMD NATHAN BATCHELDER AT 7948 ON 11/25/20 [place] KJ    Culture (A)  Final    ENTEROBACTER ASBURIAE SUSCEPTIBILITIES PERFORMED ON PREVIOUS CULTURE WITHIN THE LAST 5 DAYS. Performed at [facility], [place] [address] [place], [place] [address]    Report Status 11/27/2020 FINAL  Final  Blood Culture ID Panel (Reflexed)     Status: Abnormal   Collection Time: 11/24/20  5:04 PM  Result Value Ref Range Status   Enterococcus faecalis NOT DETECTED NOT DETECTED Final   Enterococcus Faecium NOT DETECTED NOT DETECTED Final   Listeria monocytogenes NOT DETECTED NOT DETECTED Final   Staphylococcus species NOT DETECTED NOT DETECTED Final   Staphylococcus aureus (BCID) NOT DETECTED NOT DETECTED Final   Staphylococcus epidermidis NOT DETECTED NOT DETECTED Final   Staphylococcus lugdunensis NOT DETECTED NOT DETECTED Final   Streptococcus species NOT DETECTED NOT DETECTED Final   Streptococcus agalactiae NOT DETECTED NOT DETECTED Final   Streptococcus pneumoniae NOT DETECTED NOT DETECTED Final   Streptococcus pyogenes NOT DETECTED NOT DETECTED Final    A.calcoaceticus-baumannii NOT DETECTED NOT DETECTED Final   Bacteroides fragilis NOT DETECTED NOT DETECTED Final   Enterobacterales DETECTED (A) NOT DETECTED Final    Comment: Enterobacterales represent a large order of gram negative bacteria, not a single organism. CRITICAL  RESULT CALLED TO, READ BACK BY AND VERIFIED WITH: [place] AT 1007 ON 11/25/20 BY KJ    Enterobacter cloacae complex DETECTED (A) NOT DETECTED Final    Comment: CRITICAL RESULT CALLED TO, READ BACK BY AND VERIFIED WITH: [place] AT 1219 ON 11/25/20 BY KJ    Escherichia coli NOT DETECTED NOT DETECTED Final   Klebsiella aerogenes NOT DETECTED NOT DETECTED Final   Klebsiella oxytoca NOT DETECTED NOT DETECTED Final   Klebsiella pneumoniae NOT DETECTED NOT DETECTED Final   Proteus species NOT DETECTED NOT DETECTED Final   Salmonella species NOT DETECTED NOT DETECTED Final   Serratia marcescens NOT DETECTED NOT DETECTED Final   Haemophilus influenzae NOT DETECTED NOT DETECTED Final   Neisseria meningitidis NOT DETECTED NOT DETECTED Final   Pseudomonas aeruginosa NOT DETECTED NOT DETECTED Final   Stenotrophomonas maltophilia NOT DETECTED NOT DETECTED Final   Candida albicans NOT DETECTED NOT DETECTED Final   Candida auris NOT DETECTED NOT DETECTED Final   Candida glabrata NOT DETECTED NOT DETECTED Final   Candida krusei NOT DETECTED NOT DETECTED Final   Candida parapsilosis NOT DETECTED NOT DETECTED Final   Candida tropicalis NOT DETECTED NOT DETECTED Final   Cryptococcus neoformans/gattii NOT DETECTED NOT DETECTED Final   CTX-M ESBL NOT DETECTED NOT DETECTED Final   Carbapenem resistance IMP NOT DETECTED NOT DETECTED Final   Carbapenem resistance KPC NOT DETECTED NOT DETECTED Final   Carbapenem resistance NDM NOT DETECTED NOT DETECTED Final   Carbapenem resist OXA 48 LIKE NOT DETECTED NOT DETECTED Final   Carbapenem resistance VIM NOT DETECTED NOT DETECTED Final    Comment: Performed at  [facility], [address], [place], [place] [address]  Culture, blood (Routine x 2)     Status: Abnormal   Collection Time: 11/24/20  5:09 PM   Specimen: BLOOD LEFT ARM  Result Value Ref Range Status   Specimen Description   Final    BLOOD LEFT ARM Performed at [facility], [place] [address] [place], [place] [address]    Special Requests   Final    BOTTLES DRAWN AEROBIC AND ANAEROBIC Blood Culture adequate volume Performed at [place] [address] [place], [place] [address]    Culture  Setup Time   Final    GRAM POSITIVE COCCI GRAM NEGATIVE RODS Organism ID to follow IN BOTH AEROBIC AND ANAEROBIC BOTTLES CRITICAL RESULT CALLED TO, READ BACK BY AND VERIFIED WITH: PHARMD A.PHAN AT 1239 ON 11/25/2020 BY T.SAAD.    Culture (A)  Final    ENTEROBACTER ASBURIAE STAPHYLOCOCCUS EPIDERMIDIS THE SIGNIFICANCE OF ISOLATING THIS ORGANISM FROM A SINGLE SET OF BLOOD CULTURES WHEN MULTIPLE SETS ARE DRAWN IS UNCERTAIN. PLEASE NOTIFY THE MICROBIOLOGY DEPARTMENT WITHIN ONE WEEK IF SPECIATION AND SENSITIVITIES ARE REQUIRED. Performed at [facility], [place] [address] [place], [place] [address]    Report Status 11/27/2020 FINAL  Final   Organism ID, Bacteria ENTEROBACTER ASBURIAE  Final      Susceptibility   Enterobacter asburiae - MIC*    CEFAZOLIN >=64 RESISTANT Resistant     CEFEPIME <=0.12 SENSITIVE Sensitive     CEFTAZIDIME <=1 SENSITIVE Sensitive     CEFTRIAXONE <=0.25 SENSITIVE Sensitive     CIPROFLOXACIN <=0.25 SENSITIVE Sensitive     GENTAMICIN <=1 SENSITIVE Sensitive     IMIPENEM <=0.25 SENSITIVE Sensitive     TRIMETH/SULFA <=20 SENSITIVE Sensitive     PIP/TAZO <=4 SENSITIVE Sensitive     * ENTEROBACTER  ASBURIAE  Blood Culture ID Panel (Reflexed)     Status: Abnormal   Collection Time: 11/24/20  5:09 PM  Result Value Ref Range Status   Enterococcus faecalis NOT DETECTED NOT DETECTED Final   Enterococcus Faecium NOT DETECTED NOT DETECTED  Final   Listeria monocytogenes NOT DETECTED NOT DETECTED Final   Staphylococcus species DETECTED (A) NOT DETECTED Final    Comment: CRITICAL RESULT CALLED TO, READ BACK BY AND VERIFIED WITH: PHARMD A.PHAN AT 1239 ON 11/25/2020 BY T.SAAD.    Staphylococcus aureus (BCID) NOT DETECTED NOT DETECTED Final   Staphylococcus epidermidis DETECTED (A) NOT DETECTED Final    Comment: Methicillin (oxacillin) resistant coagulase negative staphylococcus. Possible blood culture contaminant (unless isolated from more than one blood culture draw or clinical case suggests pathogenicity). No antibiotic treatment is indicated for blood  culture contaminants. CRITICAL RESULT CALLED TO, READ BACK BY AND VERIFIED WITH: PHARMD A.PHAN AT 1239 ON 11/25/2020 BY T.SAAD.    Staphylococcus lugdunensis NOT DETECTED NOT DETECTED Final   Streptococcus species NOT DETECTED NOT DETECTED Final   Streptococcus agalactiae NOT DETECTED NOT DETECTED Final   Streptococcus pneumoniae NOT DETECTED NOT DETECTED Final   Streptococcus pyogenes NOT DETECTED NOT DETECTED Final   A.calcoaceticus-baumannii NOT DETECTED NOT DETECTED Final   Bacteroides fragilis NOT DETECTED NOT DETECTED Final   Enterobacterales DETECTED (A) NOT DETECTED Final    Comment: Enterobacterales represent a large order of gram negative bacteria, not a single organism. CRITICAL RESULT CALLED TO, READ BACK BY AND VERIFIED WITH: PHARMD A.PHAN AT 1239 ON 11/25/2020 BY T.SAAD.    Enterobacter cloacae complex DETECTED (A) NOT DETECTED Final    Comment: CRITICAL RESULT CALLED TO, READ BACK BY AND VERIFIED WITH: PHARMD A.PHAN AT 1239 ON 11/25/2020 BY T.SAAD.    Escherichia coli NOT DETECTED NOT DETECTED Final   Klebsiella aerogenes NOT DETECTED NOT DETECTED Final   Klebsiella oxytoca NOT DETECTED NOT DETECTED Final   Klebsiella pneumoniae NOT DETECTED NOT DETECTED Final   Proteus species NOT DETECTED NOT DETECTED Final   Salmonella species NOT DETECTED NOT DETECTED  Final   Serratia marcescens NOT DETECTED NOT DETECTED Final   Haemophilus influenzae NOT DETECTED NOT DETECTED Final   Neisseria meningitidis NOT DETECTED NOT DETECTED Final   Pseudomonas aeruginosa NOT DETECTED NOT DETECTED Final   Stenotrophomonas maltophilia NOT DETECTED NOT DETECTED Final   Candida albicans NOT DETECTED NOT DETECTED Final   Candida auris NOT DETECTED NOT DETECTED Final   Candida glabrata NOT DETECTED NOT DETECTED Final   Candida krusei NOT DETECTED NOT DETECTED Final   Candida parapsilosis NOT DETECTED NOT DETECTED Final   Candida tropicalis NOT DETECTED NOT DETECTED Final   Cryptococcus neoformans/gattii NOT DETECTED NOT DETECTED Final   CTX-M ESBL NOT DETECTED NOT DETECTED Final   Carbapenem resistance IMP NOT DETECTED NOT DETECTED Final   Carbapenem resistance KPC NOT DETECTED NOT DETECTED Final   Methicillin resistance mecA/C DETECTED (A) NOT DETECTED Final    Comment: CRITICAL RESULT CALLED TO, READ BACK BY AND VERIFIED WITH: PHARMD A.PHAN AT 1239 ON 11/25/2020 BY T.SAAD.    Carbapenem resistance NDM NOT DETECTED NOT DETECTED Final   Carbapenem resist OXA 48 LIKE NOT DETECTED NOT DETECTED Final   Carbapenem resistance VIM NOT DETECTED NOT DETECTED Final    Comment: Performed at [facility], [place] [address] [place],  [address]  Resp Panel by RT-PCR (Flu A&B, Covid) Nasopharyngeal Swab     Status: None   Collection Time: 11/24/20  5:12 PM  Specimen: Nasopharyngeal Swab; Nasopharyngeal(NP) swabs in vial transport medium  Result Value Ref Range Status   SARS Coronavirus 2 by RT PCR NEGATIVE NEGATIVE Final    Comment: (NOTE) SARS-CoV-2 target nucleic acids are NOT DETECTED.  The SARS-CoV-2 RNA is generally detectable in upper respiratory specimens during the acute phase of infection. The lowest concentration of SARS-CoV-2 viral copies this assay can detect is 138 copies/mL. A negative result does not preclude SARS-Cov-2 infection and should not  be used as the sole basis for treatment or other patient management decisions. A negative result may occur with  improper specimen collection/handling, submission of specimen other than nasopharyngeal swab, presence of viral mutation(s) within the areas targeted by this assay, and inadequate number of viral copies(<138 copies/mL). A negative result must be combined with clinical observations, patient history, and epidemiological information. The expected result is Negative.  Fact Sheet for Patients:  EntrepreneurPulse.com.au  Fact Sheet for Healthcare Providers:  IncredibleEmployment.be  This test is no t yet approved or cleared by the [place] FDA and  has been authorized for detection and/or diagnosis of SARS-CoV-2 by FDA under an Emergency Use Authorization (EUA). This EUA will remain  in effect (meaning this test can be used) for the duration of the COVID-19 declaration under Section 564(b)(1) of the Act, 21 U.S.C.section 360bbb-3(b)(1), unless the authorization is terminated  or revoked sooner.       Influenza A by PCR NEGATIVE NEGATIVE Final   Influenza B by PCR NEGATIVE NEGATIVE Final    Comment: (NOTE) The Xpert Xpress SARS-CoV-2/FLU/RSV plus assay is intended as an aid in the diagnosis of influenza from Nasopharyngeal swab specimens and should not be used as a sole basis for treatment. Nasal washings and aspirates are unacceptable for Xpert Xpress SARS-CoV-2/FLU/RSV testing.  Fact Sheet for Patients: EntrepreneurPulse.com.au  Fact Sheet for Healthcare Providers: IncredibleEmployment.be  This test is not yet approved or cleared by the [place] FDA and has been authorized for detection and/or diagnosis of SARS-CoV-2 by FDA under an Emergency Use Authorization (EUA). This EUA will remain in effect (meaning this test can be used) for the duration of the COVID-19 declaration under Section  564(b)(1) of the Act, 21 U.S.C. section 360bbb-3(b)(1), unless the authorization is terminated or revoked.  Performed at [facility], [place] [address] [place], [place] [address]   Urine culture     Status: Abnormal   Collection Time: 11/24/20  8:30 PM   Specimen: In/Out Cath Urine  Result Value Ref Range Status   Specimen Description   Final    IN/OUT CATH URINE Performed at [facility] [address] [place], [place] [address]    Special Requests   Final    NONE Performed at [facility], [place] [address] [place], [place] [address]    Culture 60,000 COLONIES/mL ENTEROBACTER SPECIES (A)  Final   Report Status 11/27/2020 FINAL  Final   Organism ID, Bacteria ENTEROBACTER SPECIES (A)  Final      Susceptibility   Enterobacter species - MIC*    CEFAZOLIN >=64 RESISTANT Resistant     CEFEPIME <=0.12 SENSITIVE Sensitive     CEFTRIAXONE <=0.25 SENSITIVE Sensitive     CIPROFLOXACIN <=0.25 SENSITIVE Sensitive     GENTAMICIN <=1 SENSITIVE Sensitive     IMIPENEM <=0.25 SENSITIVE Sensitive     NITROFURANTOIN <=16 SENSITIVE Sensitive     TRIMETH/SULFA <=20 SENSITIVE Sensitive     PIP/TAZO <=4 SENSITIVE Sensitive     * 60,000 COLONIES/mL  ENTEROBACTER SPECIES  Culture, blood (routine x 2)     Status: None   Collection Time: 11/27/20  5:37 AM   Specimen: BLOOD LEFT ARM  Result Value Ref Range Status   Specimen Description   Final    BLOOD LEFT ARM Performed at [place] [facility] [address], [place], [place] [address]    Special Requests   Final    BOTTLES DRAWN AEROBIC ONLY Blood Culture adequate volume Performed at [facility] [address], [place], [place] [address]    Culture   Final    NO GROWTH 5 DAYS Performed at [facility], [place] [address], [place], [place] [address]    Report Status 12/02/2020 FINAL  Final  Culture, blood (routine x 2)     Status: None   Collection  Time: 11/27/20  5:37 AM   Specimen: BLOOD  Result Value Ref Range Status   Specimen Description   Final    BLOOD LEFT WRIST Performed at [facility] [address] [place], [place] [address]    Special Requests   Final    BOTTLES DRAWN AEROBIC ONLY Blood Culture adequate volume Performed at [place] [address] [place], [place] [address]    Culture   Final    NO GROWTH 5 DAYS Performed at [facility], [place] [address], [place],  [address]    Report Status 12/02/2020 FINAL  Final          Radiology Studies: No results found.      Scheduled Meds:  Carbidopa-Levodopa ER  1.5 tablet Oral TID   feeding supplement  237 mL Oral BID BM   metoprolol tartrate  12.5 mg Oral BID   Continuous Infusions:      LOS: 8 days    Time spent: 35 minutes.     Elmarie Shiley, MD [facility]   If 7PM-7AM, please contact night-coverage [email or web address]  12/02/2020, 4:06 PM

## 2020-12-02 NOTE — Progress Notes (Signed)
Transition of Care (TOC) -30 day Note       Patient Details  Name: [name] MRN: [id number] Date of Birth: 08-23-1918     MUST ID: [id number]    To Whom it May Concern:   Please be advised that the above patient will require a short-term nursing home stay, anticipated 30 days or less rehabilitation and strengthening. The plan is for return home.

## 2020-12-02 NOTE — TOC Progression Note (Signed)
Transition of Care [facility]) - Progression Note    Patient Details  Name: Iam Lipson MRN: [id number] Date of Birth: [birth year]  Transition [facility] CM/SW Contact  Laurinda Carreno, Marjie Skiff, RN Phone Number: 12/02/2020, 1:59 PM  Clinical Narrative:     Spoke with pt and family at bedside to provide SNF bed offers. Wife states that pt was able to talk with his priest last night and he expressed that he is ready to die. Will reach out to attending and to Palliative medicine team to re-evaluate goals of care.  Expected Discharge Plan: [place] Barriers to Discharge: Continued Medical Work up  Expected Discharge Plan and Services Expected Discharge Plan: [place]   Discharge Planning Services: CM Consult   Living arrangements for the past 2 months: Single Family Home                     Social Determinants of Health (SDOH) Interventions    Readmission Risk Interventions No flowsheet data found.

## 2020-12-03 DIAGNOSIS — G20 Parkinson's disease: Secondary | ICD-10-CM | POA: Diagnosis not present

## 2020-12-03 DIAGNOSIS — G9341 Metabolic encephalopathy: Secondary | ICD-10-CM | POA: Diagnosis not present

## 2020-12-03 DIAGNOSIS — A419 Sepsis, unspecified organism: Secondary | ICD-10-CM | POA: Diagnosis not present

## 2020-12-03 DIAGNOSIS — R6520 Severe sepsis without septic shock: Secondary | ICD-10-CM | POA: Diagnosis not present

## 2020-12-03 LAB — METHYLMALONIC ACID, SERUM: Methylmalonic Acid, Quantitative: 306 nmol/L (ref 0–378)

## 2020-12-03 NOTE — Progress Notes (Signed)
Nutrition Brief Note  Patient last seen by a RD on 6/16. Chart reviewed. Patient now transitioning to comfort care and notes indicate family is interested in [facility]. No further nutrition interventions planned at this time.  Please re-consult as needed.       Jarome Matin, MS, RD, LDN, CNSC Inpatient Clinical Dietitian RD pager # available in [facility]  After hours/weekend pager # available in [facility]

## 2020-12-03 NOTE — Progress Notes (Signed)
Manufacturing engineer [facility]) Hospital Liaison Note:  Update on this [place] Referral:  [facility] spoke with family at bedside to acknowledge referral, support and answer hospice related questions. Hospice eligibility is pending at this time.  Unfortunately [facility] is not able to offer a room today. Family aware.   [facility] liaison will follow up with TOC and family tomorrow or sooner if room becomes available.   Please do not hesitate to call with questions.  Thank you,   Gar Ponto, RN [facility] Liaison  [place] are on AMION

## 2020-12-03 NOTE — Plan of Care (Signed)
Pt able to sleep through the night; still moaning at times; took all Rx meds; pt turned q2hrs; no s/s of acute distress or pain observed or reported; call light within reach and bed at lowest position with door open for close monitoring.

## 2020-12-03 NOTE — Progress Notes (Signed)
[facility]  PROGRESS NOTE  [name] QHU:[id number] DOB: 06/20/1918 DOA: 11/24/2020 PCP: Vernie Shanks, MD   Brief HPI:   87 year old male with history of Parkinson disease, prostate cancer, s/p prostatectomy, CKD stage III presented with fall at home. He was found to have Enterobacter bacteremia from UTI.  He received total 8 days of IV antibiotics.  Antibiotics were stopped on 12/02/2020 as patient wished to proceed with full comfort care.    Subjective   Patient seen and examined, no new complaints.  Awaiting bed at residential hospice.   Assessment/Plan:     Enterobacter asburiae bacteremia -Treated with cefepime from 11/24/2020 to 11/28/2020 -Antibiotics were changed to imipenem to avoid neurotoxicity from cefepime -He received 8 days of antibiotics -Repeat blood culture from 11/27/2020 showed no growth -He has been transitioned to comfort measures only; awaiting bed at residential hospice  Metabolic encephalopathy -Secondary to sepsis/GNR bacteremia -CT head showed no acute intracranial process -TSH normal, ammonia level was normal -Confusion has resolved  Elevated troponin -Troponin was elevated 63-75-38 -Likely from demand ischemia  Staph epidermidis bacteremia -Likely contamination  Parkinson disease -Continue carbidopa/levodopa  Hypertension -Continue Norvasc, metoprolol  Goals of care -Plan to go to residential hospice    Scheduled medications:    Carbidopa-Levodopa ER  1.5 tablet Oral TID   feeding supplement  237 mL Oral BID BM   metoprolol tartrate  12.5 mg Oral BID         Data Reviewed:   CBG:  Recent Labs  Lab 11/27/20 1748 11/28/20 0003 11/28/20 0613 11/28/20 1150 11/29/20 0016  GLUCAP 137* 187* 129* 112* 152*    SpO2: 94 % O2 Flow Rate (L/min): 2 L/min    Vitals:   12/01/20 1334 12/02/20 0557 12/03/20 0458 12/03/20 0500  BP: (!) 168/68 (!) 165/78 (!) 163/69   Pulse: 70 69 67   Resp:  17 16   Temp:  (!) 97.4  F (36.3 C) 97.8 F (36.6 C)   TempSrc:  Oral Oral   SpO2:  95% 94%   Weight:  70.3 kg  69.5 kg  Height:         Intake/Output Summary (Last 24 hours) at 12/03/2020 1839 Last data filed at 12/03/2020 0500 Gross per 24 hour  Intake --  Output 1250 ml  Net -1250 ml    06/20 1901 - 06/22 0700 In: -  Out: 3100 [Urine:3100]  Filed Weights   12/01/20 0457 12/02/20 0557 12/03/20 0500  Weight: 71.5 kg 70.3 kg 69.5 kg    CBC:  Recent Labs  Lab 11/27/20 0537 11/28/20 0539 11/30/20 0643  WBC 6.2 6.9 10.3  HGB 8.0* 7.9* 8.7*  HCT 24.3* 24.4* 25.9*  PLT 111* 107* 147*  MCV 100.4* 100.8* 99.2  MCH 33.1 32.6 33.3  MCHC 32.9 32.4 33.6  RDW 14.6 14.9 15.0  LYMPHSABS 0.2* 0.5*  --   MONOABS 0.5 0.7  --   EOSABS 0.0 0.1  --   BASOSABS 0.0 0.0  --     Complete metabolic panel:  Recent Labs  Lab 11/27/20 0537 11/28/20 0539 11/30/20 0643 12/01/20 1021 12/02/20 0858  NA 139 142 143 143 141  K 3.6 3.6 3.7 4.2 4.5  CL 109 111 111 109 109  CO2 22 24 27 29 28   GLUCOSE 127* 138* 135* 166* 105*  BUN 61* 55* 48* 53* 48*  CREATININE 1.68* 1.66* 1.32* 1.45* 1.37*  CALCIUM 7.8* 7.8* 8.2* 8.4* 8.1*  AST 22 25  --   --   --  ALT 18 <5  --   --   --   ALKPHOS 52 50  --   --   --   BILITOT 0.6 0.3  --   --   --   ALBUMIN 2.3* 2.0*  --   --   --   MG 2.3 2.4  --   --   --     No results for input(s): LIPASE, AMYLASE in the last 168 hours.  No results for input(s): CRP, DDIMER, BNP, PROCALCITON, SARSCOV2NAA in the last 168 hours.  Invalid input(s): LACTICACID  ------------------------------------------------------------------------------------------------------------------ No results for input(s): CHOL, HDL, LDLCALC, TRIG, CHOLHDL, LDLDIRECT in the last 72 hours.  Lab Results  Component Value Date   HGBA1C 5.2 11/25/2020   ------------------------------------------------------------------------------------------------------------------ No results for input(s): TSH,  T4TOTAL, T3FREE, THYROIDAB in the last 72 hours.  Invalid input(s): FREET3 ------------------------------------------------------------------------------------------------------------------ No results for input(s): VITAMINB12, FOLATE, FERRITIN, TIBC, IRON, RETICCTPCT in the last 72 hours.  Coagulation profile No results for input(s): INR, PROTIME in the last 168 hours. No results for input(s): DDIMER in the last 72 hours.  Cardiac Enzymes No results for input(s): CKTOTAL, CKMB, CKMBINDEX, TROPONINI in the last 168 hours.  ------------------------------------------------------------------------------------------------------------------ No results found for: BNP   Antibiotics: Anti-infectives (From admission, onward)    Start     Dose/Rate Route Frequency Ordered Stop   11/29/20 1800  meropenem (MERREM) 1 g in sodium chloride 0.9 % 100 mL IVPB  Status:  Discontinued        1 g 200 mL/hr over 30 Minutes Intravenous Every 12 hours 11/29/20 1523 12/02/20 1105   11/25/20 1700  ceFEPIme (MAXIPIME) 2 g in sodium chloride 0.9 % 100 mL IVPB  Status:  Discontinued        2 g 200 mL/hr over 30 Minutes Intravenous Every 24 hours 11/25/20 1125 11/28/20 1531   11/25/20 0300  cefTRIAXone (ROCEPHIN) 1 g in sodium chloride 0.9 % 100 mL IVPB  Status:  Discontinued        1 g 200 mL/hr over 30 Minutes Intravenous Every 24 hours 11/24/20 2154 11/25/20 1125   11/24/20 1730  vancomycin (VANCOCIN) 1,500 mg in sodium chloride 0.9 % 500 mL IVPB        1,500 mg 250 mL/hr over 120 Minutes Intravenous  Once 11/24/20 1716 11/24/20 2209   11/24/20 1715  ceFEPIme (MAXIPIME) 2 g in sodium chloride 0.9 % 100 mL IVPB        2 g 200 mL/hr over 30 Minutes Intravenous  Once 11/24/20 1712 11/24/20 1817   11/24/20 1715  metroNIDAZOLE (FLAGYL) IVPB 500 mg        500 mg 100 mL/hr over 60 Minutes Intravenous  Once 11/24/20 1712 11/24/20 2127   11/24/20 1715  vancomycin (VANCOCIN) IVPB 1000 mg/200 mL premix  Status:   Discontinued        1,000 mg 200 mL/hr over 60 Minutes Intravenous  Once 11/24/20 1712 11/24/20 1716        Radiology Reports  No results found.    DVT prophylaxis: No DVT prophylaxis, patient comfort care only  Code Status: DNR  Family Communication: No family at bedside   Consultants: Palliative care  Procedures:     Objective    Physical Examination:   General-appears in no acute distress Heart-S1-S2, regular, no murmur auscultated Lungs-clear to auscultation bilaterally, no wheezing or crackles auscultated Abdomen-soft, nontender, no organomegaly Extremities-no edema in the lower extremities Neuro-alert, oriented x3, no focal deficit noted  Status is: Inpatient  Dispo: The patient is from: Home              Anticipated d/c is to: Residential hospice              Anticipated d/c date is: 12/05/2020              Patient currently stable for discharge  Barrier to discharge-awaiting bed at residential hospice  COVID-19 Labs  No results for input(s): DDIMER, FERRITIN, LDH, CRP in the last 72 hours.  Lab Results  Component Value Date   [place] NEGATIVE 11/24/2020    Microbiology  Recent Results (from the past 240 hour(s))  Culture, blood (Routine x 2)     Status: Abnormal   Collection Time: 11/24/20  5:04 PM   Specimen: BLOOD RIGHT ARM  Result Value Ref Range Status   Specimen Description   Final    BLOOD RIGHT ARM Performed at [facility], [address], [place], [place] [address]    Special Requests   Final    BOTTLES DRAWN AEROBIC AND ANAEROBIC Blood Culture adequate volume Performed at [facility] [address] [place], [place] [address]    Culture  Setup Time   Final    GRAM NEGATIVE RODS IN BOTH AEROBIC AND ANAEROBIC BOTTLES Organism ID to follow CRITICAL RESULT CALLED TO, READ BACK BY AND VERIFIED WITH: PHARMD NATHAN BATCHELDER AT 8341 ON 11/25/20 [place] KJ    Culture (A)  Final    ENTEROBACTER  ASBURIAE SUSCEPTIBILITIES PERFORMED ON PREVIOUS CULTURE WITHIN THE LAST 5 DAYS. Performed at [facility], [place] [address] [place], [place] [address]    Report Status 11/27/2020 FINAL  Final  Blood Culture ID Panel (Reflexed)     Status: Abnormal   Collection Time: 11/24/20  5:04 PM  Result Value Ref Range Status   Enterococcus faecalis NOT DETECTED NOT DETECTED Final   Enterococcus Faecium NOT DETECTED NOT DETECTED Final   Listeria monocytogenes NOT DETECTED NOT DETECTED Final   Staphylococcus species NOT DETECTED NOT DETECTED Final   Staphylococcus aureus (BCID) NOT DETECTED NOT DETECTED Final   Staphylococcus epidermidis NOT DETECTED NOT DETECTED Final   Staphylococcus lugdunensis NOT DETECTED NOT DETECTED Final   Streptococcus species NOT DETECTED NOT DETECTED Final   Streptococcus agalactiae NOT DETECTED NOT DETECTED Final   Streptococcus pneumoniae NOT DETECTED NOT DETECTED Final   Streptococcus pyogenes NOT DETECTED NOT DETECTED Final   A.calcoaceticus-baumannii NOT DETECTED NOT DETECTED Final   Bacteroides fragilis NOT DETECTED NOT DETECTED Final   Enterobacterales DETECTED (A) NOT DETECTED Final    Comment: Enterobacterales represent a large order of gram negative bacteria, not a single organism. CRITICAL RESULT CALLED TO, READ BACK BY AND VERIFIED WITH: [place] AT 9798 ON 11/25/20 BY KJ    Enterobacter cloacae complex DETECTED (A) NOT DETECTED Final    Comment: CRITICAL RESULT CALLED TO, READ BACK BY AND VERIFIED WITH: [place] AT 1117 ON 11/25/20 BY KJ    Escherichia coli NOT DETECTED NOT DETECTED Final   Klebsiella aerogenes NOT DETECTED NOT DETECTED Final   Klebsiella oxytoca NOT DETECTED NOT DETECTED Final   Klebsiella pneumoniae NOT DETECTED NOT DETECTED Final   Proteus species NOT DETECTED NOT DETECTED Final   Salmonella species NOT DETECTED NOT DETECTED Final   Serratia marcescens NOT DETECTED NOT DETECTED Final   Haemophilus  influenzae NOT DETECTED NOT DETECTED Final   Neisseria meningitidis NOT DETECTED NOT DETECTED Final   Pseudomonas aeruginosa NOT  DETECTED NOT DETECTED Final   Stenotrophomonas maltophilia NOT DETECTED NOT DETECTED Final   Candida albicans NOT DETECTED NOT DETECTED Final   Candida auris NOT DETECTED NOT DETECTED Final   Candida glabrata NOT DETECTED NOT DETECTED Final   Candida krusei NOT DETECTED NOT DETECTED Final   Candida parapsilosis NOT DETECTED NOT DETECTED Final   Candida tropicalis NOT DETECTED NOT DETECTED Final   Cryptococcus neoformans/gattii NOT DETECTED NOT DETECTED Final   CTX-M ESBL NOT DETECTED NOT DETECTED Final   Carbapenem resistance IMP NOT DETECTED NOT DETECTED Final   Carbapenem resistance KPC NOT DETECTED NOT DETECTED Final   Carbapenem resistance NDM NOT DETECTED NOT DETECTED Final   Carbapenem resist OXA 48 LIKE NOT DETECTED NOT DETECTED Final   Carbapenem resistance VIM NOT DETECTED NOT DETECTED Final    Comment: Performed at [facility], [address], [place], [place] [address]  Culture, blood (Routine x 2)     Status: Abnormal   Collection Time: 11/24/20  5:09 PM   Specimen: BLOOD LEFT ARM  Result Value Ref Range Status   Specimen Description   Final    BLOOD LEFT ARM Performed at [facility], [place] [address] [place], [place] [address]    Special Requests   Final    BOTTLES DRAWN AEROBIC AND ANAEROBIC Blood Culture adequate volume Performed at [facility] [address] [place], [place] [address]    Culture  Setup Time   Final    GRAM POSITIVE COCCI GRAM NEGATIVE RODS Organism ID to follow IN BOTH AEROBIC AND ANAEROBIC BOTTLES CRITICAL RESULT CALLED TO, READ BACK BY AND VERIFIED WITH: PHARMD A.PHAN AT 1239 ON 11/25/2020 BY T.SAAD.    Culture (A)  Final    ENTEROBACTER ASBURIAE STAPHYLOCOCCUS EPIDERMIDIS THE SIGNIFICANCE OF ISOLATING THIS ORGANISM FROM A SINGLE SET OF BLOOD CULTURES WHEN MULTIPLE SETS ARE DRAWN  IS UNCERTAIN. PLEASE NOTIFY THE MICROBIOLOGY DEPARTMENT WITHIN ONE WEEK IF SPECIATION AND SENSITIVITIES ARE REQUIRED. Performed at [facility], [place] [address], [place], [place] [address]    Report Status 11/27/2020 FINAL  Final   Organism ID, Bacteria ENTEROBACTER ASBURIAE  Final      Susceptibility   Enterobacter asburiae - MIC*    CEFAZOLIN >=64 RESISTANT Resistant     CEFEPIME <=0.12 SENSITIVE Sensitive     CEFTAZIDIME <=1 SENSITIVE Sensitive     CEFTRIAXONE <=0.25 SENSITIVE Sensitive     CIPROFLOXACIN <=0.25 SENSITIVE Sensitive     GENTAMICIN <=1 SENSITIVE Sensitive     IMIPENEM <=0.25 SENSITIVE Sensitive     TRIMETH/SULFA <=20 SENSITIVE Sensitive     PIP/TAZO <=4 SENSITIVE Sensitive     * ENTEROBACTER ASBURIAE  Blood Culture ID Panel (Reflexed)     Status: Abnormal   Collection Time: 11/24/20  5:09 PM  Result Value Ref Range Status   Enterococcus faecalis NOT DETECTED NOT DETECTED Final   Enterococcus Faecium NOT DETECTED NOT DETECTED Final   Listeria monocytogenes NOT DETECTED NOT DETECTED Final   Staphylococcus species DETECTED (A) NOT DETECTED Final    Comment: CRITICAL RESULT CALLED TO, READ BACK BY AND VERIFIED WITH: PHARMD A.PHAN AT 1239 ON 11/25/2020 BY T.SAAD.    Staphylococcus aureus (BCID) NOT DETECTED NOT DETECTED Final   Staphylococcus epidermidis DETECTED (A) NOT DETECTED Final    Comment: Methicillin (oxacillin) resistant coagulase negative staphylococcus. Possible blood culture contaminant (unless isolated from more than one blood culture draw or clinical case suggests pathogenicity). No antibiotic treatment is indicated for  blood  culture contaminants. CRITICAL RESULT CALLED TO, READ BACK BY AND VERIFIED WITH: PHARMD A.PHAN AT 1239 ON 11/25/2020 BY T.SAAD.    Staphylococcus lugdunensis NOT DETECTED NOT DETECTED Final   Streptococcus species NOT DETECTED NOT DETECTED Final   Streptococcus agalactiae NOT DETECTED NOT DETECTED Final   Streptococcus  pneumoniae NOT DETECTED NOT DETECTED Final   Streptococcus pyogenes NOT DETECTED NOT DETECTED Final   A.calcoaceticus-baumannii NOT DETECTED NOT DETECTED Final   Bacteroides fragilis NOT DETECTED NOT DETECTED Final   Enterobacterales DETECTED (A) NOT DETECTED Final    Comment: Enterobacterales represent a large order of gram negative bacteria, not a single organism. CRITICAL RESULT CALLED TO, READ BACK BY AND VERIFIED WITH: PHARMD A.PHAN AT 1239 ON 11/25/2020 BY T.SAAD.    Enterobacter cloacae complex DETECTED (A) NOT DETECTED Final    Comment: CRITICAL RESULT CALLED TO, READ BACK BY AND VERIFIED WITH: PHARMD A.PHAN AT 1239 ON 11/25/2020 BY T.SAAD.    Escherichia coli NOT DETECTED NOT DETECTED Final   Klebsiella aerogenes NOT DETECTED NOT DETECTED Final   Klebsiella oxytoca NOT DETECTED NOT DETECTED Final   Klebsiella pneumoniae NOT DETECTED NOT DETECTED Final   Proteus species NOT DETECTED NOT DETECTED Final   Salmonella species NOT DETECTED NOT DETECTED Final   Serratia marcescens NOT DETECTED NOT DETECTED Final   Haemophilus influenzae NOT DETECTED NOT DETECTED Final   Neisseria meningitidis NOT DETECTED NOT DETECTED Final   Pseudomonas aeruginosa NOT DETECTED NOT DETECTED Final   Stenotrophomonas maltophilia NOT DETECTED NOT DETECTED Final   Candida albicans NOT DETECTED NOT DETECTED Final   Candida auris NOT DETECTED NOT DETECTED Final   Candida glabrata NOT DETECTED NOT DETECTED Final   Candida krusei NOT DETECTED NOT DETECTED Final   Candida parapsilosis NOT DETECTED NOT DETECTED Final   Candida tropicalis NOT DETECTED NOT DETECTED Final   Cryptococcus neoformans/gattii NOT DETECTED NOT DETECTED Final   CTX-M ESBL NOT DETECTED NOT DETECTED Final   Carbapenem resistance IMP NOT DETECTED NOT DETECTED Final   Carbapenem resistance KPC NOT DETECTED NOT DETECTED Final   Methicillin resistance mecA/C DETECTED (A) NOT DETECTED Final    Comment: CRITICAL RESULT CALLED TO, READ BACK  BY AND VERIFIED WITH: PHARMD A.PHAN AT 1239 ON 11/25/2020 BY T.SAAD.    Carbapenem resistance NDM NOT DETECTED NOT DETECTED Final   Carbapenem resist OXA 48 LIKE NOT DETECTED NOT DETECTED Final   Carbapenem resistance VIM NOT DETECTED NOT DETECTED Final    Comment: Performed at [facility], [place] [address], [place], [place] [address]  Resp Panel by RT-PCR (Flu A&B, Covid) Nasopharyngeal Swab     Status: None   Collection Time: 11/24/20  5:12 PM   Specimen: Nasopharyngeal Swab; Nasopharyngeal(NP) swabs in vial transport medium  Result Value Ref Range Status   SARS Coronavirus 2 by RT PCR NEGATIVE NEGATIVE Final    Comment: (NOTE) SARS-CoV-2 target nucleic acids are NOT DETECTED.  The SARS-CoV-2 RNA is generally detectable in upper respiratory specimens during the acute phase of infection. The lowest concentration of SARS-CoV-2 viral copies this assay can detect is 138 copies/mL. A negative result does not preclude SARS-Cov-2 infection and should not be used as the sole basis for treatment or other patient management decisions. A negative result may occur with  improper specimen collection/handling, submission of specimen other than nasopharyngeal swab, presence of viral mutation(s) within the areas targeted by this assay, and inadequate number of viral copies(<138 copies/mL). A negative result must be combined with clinical observations,  patient history, and epidemiological information. The expected result is Negative.  Fact Sheet for Patients:  EntrepreneurPulse.com.au  Fact Sheet for Healthcare Providers:  IncredibleEmployment.be  This test is no t yet approved or cleared by the [place] FDA and  has been authorized for detection and/or diagnosis of SARS-CoV-2 by FDA under an Emergency Use Authorization (EUA). This EUA will remain  in effect (meaning this test can be used) for the duration of the COVID-19 declaration under Section  564(b)(1) of the Act, 21 U.S.C.section 360bbb-3(b)(1), unless the authorization is terminated  or revoked sooner.       Influenza A by PCR NEGATIVE NEGATIVE Final   Influenza B by PCR NEGATIVE NEGATIVE Final    Comment: (NOTE) The Xpert Xpress SARS-CoV-2/FLU/RSV plus assay is intended as an aid in the diagnosis of influenza from Nasopharyngeal swab specimens and should not be used as a sole basis for treatment. Nasal washings and aspirates are unacceptable for Xpert Xpress SARS-CoV-2/FLU/RSV testing.  Fact Sheet for Patients: EntrepreneurPulse.com.au  Fact Sheet for Healthcare Providers: IncredibleEmployment.be  This test is not yet approved or cleared by the [place] FDA and has been authorized for detection and/or diagnosis of SARS-CoV-2 by FDA under an Emergency Use Authorization (EUA). This EUA will remain in effect (meaning this test can be used) for the duration of the COVID-19 declaration under Section 564(b)(1) of the Act, 21 U.S.C. section 360bbb-3(b)(1), unless the authorization is terminated or revoked.  Performed at [facility], [place] [address], [place], [place] [address]   Urine culture     Status: Abnormal   Collection Time: 11/24/20  8:30 PM   Specimen: In/Out Cath Urine  Result Value Ref Range Status   Specimen Description   Final    IN/OUT CATH URINE Performed at [facility] [address] [place] [address], [place], [place] [address]    Special Requests   Final    NONE Performed at [facility], [place] [address] [place], [place] [address]    Culture 60,000 COLONIES/mL ENTEROBACTER SPECIES (A)  Final   Report Status 11/27/2020 FINAL  Final   Organism ID, Bacteria ENTEROBACTER SPECIES (A)  Final      Susceptibility   Enterobacter species - MIC*    CEFAZOLIN >=64 RESISTANT Resistant     CEFEPIME <=0.12 SENSITIVE Sensitive     CEFTRIAXONE <=0.25 SENSITIVE Sensitive      CIPROFLOXACIN <=0.25 SENSITIVE Sensitive     GENTAMICIN <=1 SENSITIVE Sensitive     IMIPENEM <=0.25 SENSITIVE Sensitive     NITROFURANTOIN <=16 SENSITIVE Sensitive     TRIMETH/SULFA <=20 SENSITIVE Sensitive     PIP/TAZO <=4 SENSITIVE Sensitive     * 60,000 COLONIES/mL ENTEROBACTER SPECIES  Culture, blood (routine x 2)     Status: None   Collection Time: 11/27/20  5:37 AM   Specimen: BLOOD LEFT ARM  Result Value Ref Range Status   Specimen Description   Final    BLOOD LEFT ARM Performed at [facility] [address] [place], [place] [address]    Special Requests   Final    BOTTLES DRAWN AEROBIC ONLY Blood Culture adequate volume Performed at [facility] [address] [place], [place] [address]    Culture   Final    NO GROWTH 5 DAYS Performed at [facility], [place] [address] [place],  [address]    Report Status 12/02/2020 FINAL  Final  Culture, blood (routine x 2)  Status: None   Collection Time: 11/27/20  5:37 AM   Specimen: BLOOD  Result Value Ref Range Status   Specimen Description   Final    BLOOD LEFT WRIST Performed at [facility] [address], [place], [place] [address]    Special Requests   Final    BOTTLES DRAWN AEROBIC ONLY Blood Culture adequate volume Performed at [facility] [address], [place], [place] [address]    Culture   Final    NO GROWTH 5 DAYS Performed at [facility], [place] [address] [place], [place] [address]    Report Status 12/02/2020 FINAL  Final        Oswald Hillock   [facility] If 7PM-7AM, please contact night-coverage at [email or web address], Office  [phone]   12/03/2020, 6:39 PM  LOS: 9 days

## 2020-12-03 NOTE — Progress Notes (Signed)
Manufacturing engineer [facility]) Hospital Liaison note.    Chart reviewed and eligibility confirmed for [facility]. Spoke with family to make them aware.  Liaison team will follow up tomorrow regarding bed availability.  Thank you for the opportunity to participate in this patient's care.  Domenic Moras, BSN, RN [facility] Liaison (listed on [place] under [facility])    [phone] (24h on call)

## 2020-12-03 NOTE — Progress Notes (Signed)
Attempted to offer follow up support, but family had many visitors in the room at the time.  Patient's wife requested that I come back later for prayers.  I will plan to do this in the morning.    [place], Bcc Pager, [phone] 4:55 PM

## 2020-12-04 MED ORDER — ACETAMINOPHEN 325 MG PO TABS: 650.0000 mg | ORAL_TABLET | Freq: Four times a day (QID) | ORAL | Status: DC | PRN

## 2020-12-04 MED ORDER — BISACODYL 10 MG RE SUPP: 10.0000 mg | Freq: Once | RECTAL | Status: DC

## 2020-12-04 MED ORDER — METOPROLOL TARTRATE 25 MG PO TABS: 12.5000 mg | ORAL_TABLET | Freq: Two times a day (BID) | ORAL | Status: DC

## 2020-12-04 MED ORDER — ALBUTEROL SULFATE (2.5 MG/3ML) 0.083% IN NEBU: 2.5000 mg | INHALATION_SOLUTION | Freq: Four times a day (QID) | RESPIRATORY_TRACT | 12 refills | Status: DC | PRN

## 2020-12-04 NOTE — Progress Notes (Addendum)
Manufacturing engineer [facility]) Hospital Liaison note   Addendum:  Consents are complete at this time.  Please feel free to line up transportation and to call report.  TOC aware.   Chart reviewed and eligibility confirmed for [facility]. Spoke with family to confirm interest and explain services. Family agreeable to transfer today. TOC aware.    [facility] will notify TOC when registration paperwork has been completed to arrange transport, which should be early afternoon.   RN, please call report to [phone].  Please be sure a signed [place] DNR form is ready to transport with pt.  Thank you for the opportunity to participate in this patient's care.  Domenic Moras, BSN, RN [facility] Liaison (listed on [place] under Hospice[facility])    [phone] (24h on call)

## 2020-12-04 NOTE — Discharge Summary (Signed)
Physician Discharge Summary  Adler Chartrand KDX:[id number] DOB: 1919/05/28 DOA: 11/24/2020  PCP: Vernie Shanks, MD  Admit date: 11/24/2020 Discharge date: 12/04/2020  Time spent: 60 minutes  Recommendations for Outpatient Follow-up:  Patient to be discharged to residential hospice  Discharge Diagnoses:  Principal Problem:   Severe sepsis [facility]) Active Problems:   DM2 (diabetes mellitus, type 2) ([place])   HLD (hyperlipidemia)   Anemia   Parkinson disease ([place])   Acute cystitis   Acute metabolic encephalopathy   Lactic acidosis   Acute kidney injury superimposed on CKD ([place])   Generalized weakness   Acute hyponatremia   Discharge Condition: Stable  Diet recommendation: Comfort diet  Filed Weights   12/02/20 0557 12/03/20 0500 12/04/20 0601  Weight: 70.3 kg 69.5 kg 69.5 kg    History of present illness:  87 year old male with history of Parkinson disease, prostate cancer, s/p prostatectomy, CKD stage III presented with fall at home. He was found to have Enterobacter bacteremia from UTI.  He received total 8 days of IV antibiotics.  Antibiotics were stopped on 12/02/2020 as patient wished to proceed with full comfort care.    Hospital Course:   Enterobacter asburiae bacteremia -Treated with cefepime from 11/24/2020 to 11/28/2020 -Antibiotics were changed to imipenem to avoid neurotoxicity from cefepime -He received 8 days of antibiotics -Repeat blood culture from 11/27/2020 showed no growth -He has been transitioned to comfort measures only; awaiting bed at residential hospice   Metabolic encephalopathy -Secondary to sepsis/GNR bacteremia -CT head showed no acute intracranial process -TSH normal, ammonia level was normal -Confusion has resolved   Elevated troponin -Troponin was elevated 63-75-38 -Likely from demand ischemia   Staph epidermidis bacteremia -Likely contamination   Parkinson disease -Continue carbidopa/levodopa   Hypertension -Continue Norvasc,  metoprolol   Goals of care -Plan to go to residential hospice        Procedures:   Consultations: Palliative care  Discharge Exam: Vitals:   12/04/20 0601 12/04/20 0939  BP: (!) 156/66 (!) 155/67  Pulse: 64 74  Resp: 16   Temp: (!) 97.5 F (36.4 C)   SpO2: 96%     General: Appears in no acute distress Cardiovascular: S1-S2, regular Respiratory: Clear to auscultation bilaterally  Discharge Instructions   Discharge Instructions     Diet - low sodium heart healthy   Complete by: As directed    Increase activity slowly   Complete by: As directed       Allergies as of 12/04/2020   No Known Allergies      Medication List     STOP taking these medications    clotrimazole-betamethasone cream Commonly known as: LOTRISONE   multivitamin tablet   simvastatin 40 MG tablet Commonly known as: ZOCOR   vitamin C 1000 MG tablet       TAKE these medications    acetaminophen 325 MG tablet Commonly known as: TYLENOL Take 2 tablets (650 mg total) by mouth every 6 (six) hours as needed for mild pain (or Fever >/= 101).   albuterol (2.5 MG/3ML) 0.083% nebulizer solution Commonly known as: PROVENTIL Take 3 mLs (2.5 mg total) by nebulization every 6 (six) hours as needed for wheezing or shortness of breath.   Carbidopa-Levodopa ER 25-100 MG tablet controlled release Commonly known as: SINEMET CR TAKE 1 AND 1/2 TABLETS BY MOUTH THREE TIMES DAILY   metoprolol tartrate 25 MG tablet Commonly known as: LOPRESSOR Take 0.5 tablets (12.5 mg total) by mouth 2 (two) times daily.  No Known Allergies    The results of significant diagnostics from this hospitalization (including imaging, microbiology, ancillary and laboratory) are listed below for reference.    Significant Diagnostic Studies: DG Chest 1 View  Result Date: 11/25/2020 CLINICAL DATA:  Increasing weakness EXAM: CHEST  1 VIEW COMPARISON:  11/24/2020 FINDINGS: Cardiac shadow is stable. Aortic  calcifications are seen. Right basilar atelectasis is noted with mild blunting of the right costophrenic angle. Left lung is clear. No bony abnormality is seen. IMPRESSION: Right basilar atelectasis and small effusion. Electronically Signed   By: Inez Catalina M.D.   On: 11/25/2020 15:31   CT Head Wo Contrast  Result Date: 11/24/2020 CLINICAL DATA:  Unwitnessed fall EXAM: CT HEAD WITHOUT CONTRAST CT CERVICAL SPINE WITHOUT CONTRAST TECHNIQUE: Multidetector CT imaging of the head and cervical spine was performed following the standard protocol without intravenous contrast. Multiplanar CT image reconstructions of the cervical spine were also generated. COMPARISON:  11/02/2006 FINDINGS: CT HEAD FINDINGS Brain: No evidence of acute infarction, hemorrhage, hydrocephalus, extra-axial collection or mass lesion/mass effect. Mild low-density changes within the periventricular and subcortical white matter compatible with chronic microvascular ischemic change. Mild diffuse cerebral volume loss. Vascular: Atherosclerotic calcifications involving the large vessels of the skull base. No unexpected hyperdense vessel. Skull: Normal. Negative for fracture or focal lesion. Sinuses/Orbits: Partial opacification of the left frontal sinus. Remaining paranasal sinuses and mastoid air cells are clear. Orbital structures within normal limits. Other: Negative for scalp hematoma. CT CERVICAL SPINE FINDINGS Alignment: Facet joints are aligned without dislocation or traumatic listhesis. Dens and lateral masses are aligned. Straightening of the cervical lordosis. Skull base and vertebrae: No acute fracture. No primary bone lesion or focal pathologic process. The right C2-3 facet joint is fused. Soft tissues and spinal canal: No prevertebral fluid or swelling. No visible canal hematoma. Disc levels: Multilevel degenerative disc disease, most severe at C5-6 where there is endplate osteophytosis and posterior longitudinal ligament ossification  resulting in at least moderate canal stenosis. Multilevel facet and uncovertebral arthropathy is also present throughout the cervical spine. Upper chest: Negative. Other: Bilateral carotid atherosclerosis. IMPRESSION: 1. No evidence of acute intracranial process. 2. Mild chronic microvascular ischemic change and cerebral volume loss. 3. No evidence of acute fracture or traumatic listhesis of the cervical spine. 4. Multilevel degenerative disc disease and facet arthropathy of the cervical spine, most severe at C5-6 where there is at least moderate canal stenosis. Electronically Signed   By: Davina Poke D.O.   On: 11/24/2020 18:09   CT Cervical Spine Wo Contrast  Result Date: 11/24/2020 CLINICAL DATA:  Unwitnessed fall EXAM: CT HEAD WITHOUT CONTRAST CT CERVICAL SPINE WITHOUT CONTRAST TECHNIQUE: Multidetector CT imaging of the head and cervical spine was performed following the standard protocol without intravenous contrast. Multiplanar CT image reconstructions of the cervical spine were also generated. COMPARISON:  11/02/2006 FINDINGS: CT HEAD FINDINGS Brain: No evidence of acute infarction, hemorrhage, hydrocephalus, extra-axial collection or mass lesion/mass effect. Mild low-density changes within the periventricular and subcortical white matter compatible with chronic microvascular ischemic change. Mild diffuse cerebral volume loss. Vascular: Atherosclerotic calcifications involving the large vessels of the skull base. No unexpected hyperdense vessel. Skull: Normal. Negative for fracture or focal lesion. Sinuses/Orbits: Partial opacification of the left frontal sinus. Remaining paranasal sinuses and mastoid air cells are clear. Orbital structures within normal limits. Other: Negative for scalp hematoma. CT CERVICAL SPINE FINDINGS Alignment: Facet joints are aligned without dislocation or traumatic listhesis. Dens and lateral masses are aligned. Straightening of the  cervical lordosis. Skull base and  vertebrae: No acute fracture. No primary bone lesion or focal pathologic process. The right C2-3 facet joint is fused. Soft tissues and spinal canal: No prevertebral fluid or swelling. No visible canal hematoma. Disc levels: Multilevel degenerative disc disease, most severe at C5-6 where there is endplate osteophytosis and posterior longitudinal ligament ossification resulting in at least moderate canal stenosis. Multilevel facet and uncovertebral arthropathy is also present throughout the cervical spine. Upper chest: Negative. Other: Bilateral carotid atherosclerosis. IMPRESSION: 1. No evidence of acute intracranial process. 2. Mild chronic microvascular ischemic change and cerebral volume loss. 3. No evidence of acute fracture or traumatic listhesis of the cervical spine. 4. Multilevel degenerative disc disease and facet arthropathy of the cervical spine, most severe at C5-6 where there is at least moderate canal stenosis. Electronically Signed   By: Davina Poke D.O.   On: 11/24/2020 18:09   DG CHEST PORT 1 VIEW  Result Date: 11/28/2020 CLINICAL DATA:  87 year old male with weakness and altered mental status. EXAM: PORTABLE CHEST 1 VIEW COMPARISON:  Portable chest 11/26/2020 and earlier. FINDINGS: Portable AP semi upright view at 1033 hours. Stable low lung volumes and mediastinal contours. Tortuous thoracic aorta. Visualized tracheal air column is within normal limits. Mild lung base atelectasis. Otherwise Allowing for portable technique the lungs are clear. No pneumothorax. Oral contrast retained in nondilated large bowel in the upper abdomen. No acute osseous abnormality identified. IMPRESSION: Low lung volumes with mild atelectasis. Electronically Signed   By: Genevie Ann M.D.   On: 11/28/2020 10:56   DG CHEST PORT 1 VIEW  Result Date: 11/26/2020 CLINICAL DATA:  87 year old male with hypoxia. EXAM: PORTABLE CHEST 1 VIEW COMPARISON:  Chest radiograph dated 11/25/2020 FINDINGS: Minimal right lung base  atelectasis. Trace right pleural effusion may be present. No focal consolidation or pneumothorax. Mild cardiomegaly. Atherosclerotic calcification of the aorta. No acute osseous pathology. IMPRESSION: 1. No focal consolidation. 2. Mild cardiomegaly. Electronically Signed   By: Anner Crete M.D.   On: 11/26/2020 20:40   DG Chest Port 1 View  Result Date: 11/24/2020 CLINICAL DATA:  Unwitnessed fall. Fever. History of prostate cancer. EXAM: PORTABLE CHEST 1 VIEW COMPARISON:  07/05/2017 FINDINGS: Shallow inspiration. Linear atelectasis in the lung bases. Heart size and pulmonary vascularity are normal for technique. No pleural effusions. No pneumothorax. Calcified and tortuous aorta. Degenerative changes in the spine and shoulders. IMPRESSION: Shallow inspiration with linear atelectasis in the lung bases. Electronically Signed   By: Lucienne Capers M.D.   On: 11/24/2020 17:56   DG Swallowing Func-Speech Pathology  Result Date: 11/26/2020 Formatting of this result is different from the original. Objective Swallowing Evaluation: Type of Study: MBS-Modified Barium Swallow Study  Patient Details Name: Danuel Felicetti MRN: [id number] Date of Birth: [birth year] Today's Date: 11/26/2020 Time: SLP Start Time (ACUTE ONLY): 1233 -SLP Stop Time (ACUTE ONLY): 1300 SLP Time Calculation (min) (ACUTE ONLY): 27 min Past Medical History: Past Medical History: Diagnosis Date  Allergy   rhinitis  Diabetes mellitus   dx 3-11, A1C 6.7  Gout   Hiatal hernia   Hyperlipidemia   Parkinson disease ([place]) 10/23/2015  Prostate CA ([place]) 2002  s/p  x-ray therapy and surgery  Skin cancer   Trigeminal neuralgia   h/o , status post gamma knife procedure and trial with multiple drugs Past Surgical History: Past Surgical History: Procedure Laterality Date  CHOLECYSTECTOMY   HPI: Mr Hinkle is a 87 yo male adm to [facility] with AMS, UTI after 2 days  of somnolence and confusion at home.  CXR showed right basilar ATX and effusion.  Pt also with h/o prostate  cancer, large hiatal hernia repair, distal esophageal diverticulum.  He resides with his wife at home.  Swallow eval ordered. Wife and pt deny pt having h/o dysphagia, sensing reflux or coughing with intake.  Wife does report he lost weight prior to admission.  Plan for palliative meeting todoay. Rapid response called yesterday due to pt having rigors, tachypnea, and low grade fever.  Subjective: pt awake in bed, wife present and desired mbs Assessment / Plan / Recommendation CHL IP CLINICAL IMPRESSIONS 11/26/2020 Clinical Impression Mild oropharyngoesophageal dysphagia noted with trace aspiration of thin due to decreased oral control allowing spillage into larynx prior to swallow.  Aspiration with thin was just below vocal cords, cleared with cued cough and was prevented with small single sips of liquids.  Mildly prolonged mastication and oral transiting of cracker noted.   Pharyngeal swallow is strong without pharyngeal retention of solids including pudding and cracker.  Prominent CP/cricopharyngeal bar with suspected developing diverticulum likley due to decreased UES relaxation noted.  Thus cervical esophageal backflow of thin observed without pt sensation.  He did not aspirate mild pyriform sinus//CP backflowed material but certainly is at risk.  Upon esophageal sweep, pt appeared with esophageal diverticulum (noted in his medical record) and suspected mild motility issues - but radiologist not present to confirm and this test does not assess esophageal phase of swallow.  Suspect pt's primary symptoms are due to his cervical esophageal dysphagia and reviewed compensation strategies with his wife using video and teach back.  Advised pt consume drinks that are slightly thicker with his meals *eg V8, OJ, coffee with cream, Ensure, etc if increased comfort, decreased coughing with intake.  Recommend thin drinks (eg water) between meals for hydration.  Pt will benefit from staying upright after meals (wife reports he  lays down at home after eating) and consuming frequent small meals as able.  Crushing medications may help to decr risk of whole lodging at cricopharyngeal region and potentially backflowing/aspirated.  Will follow up for dysphagia management/education.  Pt's swallow ability instrumentally was much better than presented clinically.  He did NOT cough reflexively during his MBS. SLP Visit Diagnosis Dysphagia, pharyngoesophageal phase (R13.14);Dysphagia, oropharyngeal phase (R13.12) Attention and concentration deficit following -- Frontal lobe and executive function deficit following -- Impact on safety and function Moderate aspiration risk   CHL IP TREATMENT RECOMMENDATION 11/26/2020 Treatment Recommendations Therapy as outlined in treatment plan below   Prognosis 11/26/2020 Prognosis for Safe Diet Advancement Good Barriers to Reach Goals Time post onset Barriers/Prognosis Comment -- CHL IP DIET RECOMMENDATION 11/26/2020 SLP Diet Recommendations SLP Diet Recommendations: Dysphagia 3 (Mech soft) solids;Thin liquid - slightly thicker drinks with meals if prevents coughing, thin water between meals for hydration  Liquid Administration via: Cup;Straw  Medication Administration: Crushed with puree  Supervision: Patient able to self feed  Compensations: Slow rate;Small sips/bites  Postural Changes: Remain semi-upright after after feeds/meals (Comment);Seated upright at 90 degrees; small frequent meals  Oral Care Recommendations: Oral care BID Liquid Administration via  Medication Administration  Compensations  Postural Changes    CHL IP OTHER RECOMMENDATIONS 11/26/2020 Recommended Consults -- Oral Care Recommendations Oral care BID Other Recommendations --   CHL IP FOLLOW UP RECOMMENDATIONS 11/26/2020 Follow up Recommendations None   CHL IP FREQUENCY AND DURATION 11/26/2020 Speech Therapy Frequency (ACUTE ONLY) min 1 x/week Treatment Duration 1 week      CHL IP  ORAL PHASE 11/26/2020 Oral Phase Impaired Oral - Pudding Teaspoon --  Oral - Pudding Cup -- Oral - Honey Teaspoon -- Oral - Honey Cup -- Oral - Nectar Teaspoon WFL;Decreased bolus cohesion;Premature spillage Oral - Nectar Cup [facility];Decreased bolus cohesion;Premature spillage Oral - Nectar Straw -- Oral - Thin Teaspoon WFL;Premature spillage;Decreased bolus cohesion Oral - Thin Cup [facility];Decreased bolus cohesion;Premature spillage Oral - Thin Straw WFL;Premature spillage;Decreased bolus cohesion Oral - Puree WFL Oral - Mech Soft Delayed oral transit;Impaired mastication;Lingual pumping Oral - Regular -- Oral - Multi-Consistency -- Oral - Pill -- Oral Phase - Comment --  CHL IP PHARYNGEAL PHASE 11/26/2020 Pharyngeal Phase Impaired Pharyngeal- Pudding Teaspoon -- Pharyngeal -- Pharyngeal- Pudding Cup -- Pharyngeal -- Pharyngeal- Honey Teaspoon -- Pharyngeal -- Pharyngeal- Honey Cup -- Pharyngeal -- Pharyngeal- Nectar Teaspoon WFL Pharyngeal Material does not enter airway Pharyngeal- Nectar Cup [facility];Pharyngeal residue - cp segment Pharyngeal Material does not enter airway Pharyngeal- Nectar Straw Pharyngeal residue - cp segment;Delayed swallow initiation-vallecula Pharyngeal Material does not enter airway Pharyngeal- Thin Teaspoon WFL;Delayed swallow initiation-pyriform sinuses;Pharyngeal residue - cp segment Pharyngeal Material does not enter airway Pharyngeal- Thin Cup Delayed swallow initiation-pyriform sinuses;Reduced laryngeal elevation;Reduced airway/laryngeal closure;Penetration/Aspiration during swallow;Pharyngeal residue - cp segment Pharyngeal Material enters airway, remains ABOVE vocal cords then ejected out Pharyngeal- Thin Straw Delayed swallow initiation-pyriform sinuses;Reduced airway/laryngeal closure;Reduced laryngeal elevation;Penetration/Aspiration before swallow;Trace aspiration;Pharyngeal residue - cp segment Pharyngeal -- Pharyngeal- Puree WFL Pharyngeal Material does not enter airway Pharyngeal- Mechanical Soft WFL Pharyngeal Material does not enter airway Pharyngeal-  Regular -- Pharyngeal -- Pharyngeal- Multi-consistency -- Pharyngeal -- Pharyngeal- Pill -- Pharyngeal -- Pharyngeal Comment use of cup prevents trace amount of aspiration, disoordinated swallow with thin allowed liquids to spill into larynx prior to swallow trigger - very trace amount of aspiration of thin that cleared  CHL IP CERVICAL ESOPHAGEAL PHASE 11/26/2020 Cervical Esophageal Phase Impaired Pudding Teaspoon -- Pudding Cup -- Honey Teaspoon -- Honey Cup -- Nectar Teaspoon -- Nectar Cup Reduced cricopharyngeal relaxation;Prominent cricopharyngeal segment Nectar Straw Reduced cricopharyngeal relaxation;Prominent cricopharyngeal segment Thin Teaspoon Reduced cricopharyngeal relaxation;Prominent cricopharyngeal segment Thin Cup Prominent cricopharyngeal segment;Reduced cricopharyngeal relaxation;Esophageal backflow into cervical esophagus Thin Straw Reduced cricopharyngeal relaxation;Prominent cricopharyngeal segment;Esophageal backflow into cervical esophagus Puree Reduced cricopharyngeal relaxation Mechanical Soft Reduced cricopharyngeal relaxation Regular -- Multi-consistency -- Pill -- Cervical Esophageal Comment -- Macario Golds 11/26/2020, 2:15 PM              ECHOCARDIOGRAM COMPLETE  Result Date: 11/28/2020    ECHOCARDIOGRAM REPORT   Patient Name:   NICANOR MENDOLIA Date of Exam: 11/28/2020 Medical Rec #:  [id number]    Height:       66.0 in Accession #:    [id number]   Weight:       144.4 lb Date of Birth:  15-Jul-1918    BSA:          1.741 m Patient Age:    39 years     BP:           166/84 mmHg Patient Gender: M            HR:           112 bpm. Exam Location:  Inpatient Procedure: 2D Echo, Cardiac Doppler and Color Doppler Indications:    R94.31 Abnormal EKG  History:        Patient has prior history of Echocardiogram examinations, most                 recent 05/23/2012.  Sonographer:  [place] Referring Phys: [id number] [place]  1. Left ventricular ejection fraction,  by estimation, is 60 to 65%. The left ventricle has normal function. The left ventricle has no regional wall motion abnormalities. There is moderate left ventricular hypertrophy. Left ventricular diastolic function  could not be evaluated.  2. Right ventricular systolic function is normal. The right ventricular size is normal.  3. The mitral valve is normal in structure. Trivial mitral valve regurgitation.  4. Tricuspid valve regurgitation is moderate.  5. The aortic valve is grossly normal. Aortic valve regurgitation is not visualized. No aortic stenosis is present. FINDINGS  Left Ventricle: Left ventricular ejection fraction, by estimation, is 60 to 65%. The left ventricle has normal function. The left ventricle has no regional wall motion abnormalities. The left ventricular internal cavity size was normal in size. There is  moderate left ventricular hypertrophy. Left ventricular diastolic function could not be evaluated due to atrial fibrillation. Left ventricular diastolic function could not be evaluated. Right Ventricle: The right ventricular size is normal. No increase in right ventricular wall thickness. Right ventricular systolic function is normal. Left Atrium: Left atrial size was normal in size. Right Atrium: Right atrial size was normal in size. Pericardium: There is no evidence of pericardial effusion. Mitral Valve: The mitral valve is normal in structure. Trivial mitral valve regurgitation. Tricuspid Valve: The tricuspid valve is grossly normal. Tricuspid valve regurgitation is moderate. Aortic Valve: The aortic valve is grossly normal. Aortic valve regurgitation is not visualized. No aortic stenosis is present. Aortic valve mean gradient measures 4.0 mmHg. Aortic valve peak gradient measures 7.3 mmHg. Aortic valve area, by VTI measures 2.35 cm. Pulmonic Valve: The pulmonic valve was grossly normal. Pulmonic valve regurgitation is not visualized. Aorta: The aortic root and ascending aorta are  structurally normal, with no evidence of dilitation. IAS/Shunts: The atrial septum is grossly normal.  LEFT VENTRICLE PLAX 2D LVIDd:         3.50 cm LVIDs:         2.30 cm LV PW:         1.40 cm LV IVS:        1.40 cm LVOT diam:     1.70 cm LV SV:         37 LV SV Index:   21 LVOT Area:     2.27 cm  RIGHT VENTRICLE          IVC RV Basal diam:  3.60 cm  IVC diam: 2.10 cm LEFT ATRIUM           Index       RIGHT ATRIUM           Index LA diam:      3.30 cm 1.90 cm/m  RA Area:     16.20 cm LA Vol (A2C): 48.7 ml 27.97 ml/m RA Volume:   33.20 ml  19.07 ml/m LA Vol (A4C): 44.9 ml 25.79 ml/m  AORTIC VALVE AV Area (Vmax):    2.36 cm AV Area (Vmean):   2.36 cm AV Area (VTI):     2.35 cm AV Vmax:           135.50 cm/s AV Vmean:          93.550 cm/s AV VTI:            0.158 m AV Peak Grad:      7.3 mmHg AV Mean Grad:      4.0 mmHg LVOT Vmax:  141.00 cm/s LVOT Vmean:        97.300 cm/s LVOT VTI:          0.164 m LVOT/AV VTI ratio: 1.03  AORTA Ao Root diam: 3.20 cm Ao Asc diam:  3.30 cm TRICUSPID VALVE TR Peak grad:   49.6 mmHg TR Vmax:        352.00 cm/s  SHUNTS Systemic VTI:  0.16 m Systemic Diam: 1.70 cm Mertie Moores MD Electronically signed by Mertie Moores MD Signature Date/Time: 11/28/2020/5:28:44 PM    Final    VAS [place] LOWER EXTREMITY VENOUS (DVT)  Result Date: 11/27/2020  Lower Venous DVT Study Patient Name:  ADEBAYO ENSMINGER  Date of Exam:   11/27/2020 Medical Rec #: [id number]     Accession #:    [id number] Date of Birth: 1918/12/12     Patient Gender: M Patient Age:   89Y Exam Location:  [facility] Procedure:      VAS [place] LOWER EXTREMITY VENOUS (DVT) Referring Phys: [id number] CALDWELL POWELL JR --------------------------------------------------------------------------------  Indications: Elevated d-dimer.  Comparison Study: 04-18-2020 Right lower extremity venous was negative for DVT. Performing Technologist: Darlin Coco RDMS,RVT  Examination Guidelines: A complete evaluation includes B-mode  imaging, spectral Doppler, color Doppler, and power Doppler as needed of all accessible portions of each vessel. Bilateral testing is considered an integral part of a complete examination. Limited examinations for reoccurring indications may be performed as noted. The reflux portion of the exam is performed with the patient in reverse Trendelenburg.  +---------+---------------+---------+-----------+----------+--------------+ RIGHT    CompressibilityPhasicitySpontaneityPropertiesThrombus Aging +---------+---------------+---------+-----------+----------+--------------+ CFV      Full           Yes      Yes                                 +---------+---------------+---------+-----------+----------+--------------+ SFJ      Full                                                        +---------+---------------+---------+-----------+----------+--------------+ FV Prox  Full                                                        +---------+---------------+---------+-----------+----------+--------------+ FV Mid   Full                                                        +---------+---------------+---------+-----------+----------+--------------+ FV DistalFull                                                        +---------+---------------+---------+-----------+----------+--------------+ PFV      Full                                                        +---------+---------------+---------+-----------+----------+--------------+  POP      Full           Yes      Yes                                 +---------+---------------+---------+-----------+----------+--------------+ PTV      Full                                                        +---------+---------------+---------+-----------+----------+--------------+ PERO     Full                                                        +---------+---------------+---------+-----------+----------+--------------+    +---------+---------------+---------+-----------+----------+--------------+ LEFT     CompressibilityPhasicitySpontaneityPropertiesThrombus Aging +---------+---------------+---------+-----------+----------+--------------+ CFV      Full           Yes      Yes                                 +---------+---------------+---------+-----------+----------+--------------+ SFJ      Full                                                        +---------+---------------+---------+-----------+----------+--------------+ FV Prox  Full                                                        +---------+---------------+---------+-----------+----------+--------------+ FV Mid   Full                                                        +---------+---------------+---------+-----------+----------+--------------+ FV DistalFull                                                        +---------+---------------+---------+-----------+----------+--------------+ PFV      Full                                                        +---------+---------------+---------+-----------+----------+--------------+ POP      Full           Yes      Yes                                 +---------+---------------+---------+-----------+----------+--------------+  PTV      Full                                                        +---------+---------------+---------+-----------+----------+--------------+ PERO     Full                                                        +---------+---------------+---------+-----------+----------+--------------+     Summary: RIGHT: - There is no evidence of deep vein thrombosis in the lower extremity.  - No cystic structure found in the popliteal fossa.  LEFT: - There is no evidence of deep vein thrombosis in the lower extremity.  - No cystic structure found in the popliteal fossa.  *See table(s) above for measurements and observations. Electronically signed  by Jamelle Haring on 11/27/2020 at 2:10:18 PM.    Final     Microbiology: Recent Results (from the past 240 hour(s))  Culture, blood (Routine x 2)     Status: Abnormal   Collection Time: 11/24/20  5:04 PM   Specimen: BLOOD RIGHT ARM  Result Value Ref Range Status   Specimen Description   Final    BLOOD RIGHT ARM Performed at [facility], [address] [place], [place] [address]    Special Requests   Final    BOTTLES DRAWN AEROBIC AND ANAEROBIC Blood Culture adequate volume Performed at [facility] [address], [place], [place] [address]    Culture  Setup Time   Final    GRAM NEGATIVE RODS IN BOTH AEROBIC AND ANAEROBIC BOTTLES Organism ID to follow CRITICAL RESULT CALLED TO, READ BACK BY AND VERIFIED WITH: PHARMD NATHAN BATCHELDER AT 9379 ON 11/25/20 [place] KJ    Culture (A)  Final    ENTEROBACTER ASBURIAE SUSCEPTIBILITIES PERFORMED ON PREVIOUS CULTURE WITHIN THE LAST 5 DAYS. Performed at [facility], [place] [address], [place], [place] [address]    Report Status 11/27/2020 FINAL  Final  Blood Culture ID Panel (Reflexed)     Status: Abnormal   Collection Time: 11/24/20  5:04 PM  Result Value Ref Range Status   Enterococcus faecalis NOT DETECTED NOT DETECTED Final   Enterococcus Faecium NOT DETECTED NOT DETECTED Final   Listeria monocytogenes NOT DETECTED NOT DETECTED Final   Staphylococcus species NOT DETECTED NOT DETECTED Final   Staphylococcus aureus (BCID) NOT DETECTED NOT DETECTED Final   Staphylococcus epidermidis NOT DETECTED NOT DETECTED Final   Staphylococcus lugdunensis NOT DETECTED NOT DETECTED Final   Streptococcus species NOT DETECTED NOT DETECTED Final   Streptococcus agalactiae NOT DETECTED NOT DETECTED Final   Streptococcus pneumoniae NOT DETECTED NOT DETECTED Final   Streptococcus pyogenes NOT DETECTED NOT DETECTED Final   A.calcoaceticus-baumannii NOT DETECTED NOT DETECTED Final   Bacteroides fragilis NOT DETECTED NOT DETECTED  Final   Enterobacterales DETECTED (A) NOT DETECTED Final    Comment: Enterobacterales represent a large order of gram negative bacteria, not a single organism. CRITICAL RESULT CALLED TO, READ BACK BY AND VERIFIED WITH: [place] AT 7353 ON 11/25/20 BY KJ    Enterobacter cloacae complex DETECTED (A) NOT DETECTED Final    Comment: CRITICAL RESULT CALLED  TO, READ BACK BY AND VERIFIED WITH: [place] AT 4315 ON 11/25/20 BY KJ    Escherichia coli NOT DETECTED NOT DETECTED Final   Klebsiella aerogenes NOT DETECTED NOT DETECTED Final   Klebsiella oxytoca NOT DETECTED NOT DETECTED Final   Klebsiella pneumoniae NOT DETECTED NOT DETECTED Final   Proteus species NOT DETECTED NOT DETECTED Final   Salmonella species NOT DETECTED NOT DETECTED Final   Serratia marcescens NOT DETECTED NOT DETECTED Final   Haemophilus influenzae NOT DETECTED NOT DETECTED Final   Neisseria meningitidis NOT DETECTED NOT DETECTED Final   Pseudomonas aeruginosa NOT DETECTED NOT DETECTED Final   Stenotrophomonas maltophilia NOT DETECTED NOT DETECTED Final   Candida albicans NOT DETECTED NOT DETECTED Final   Candida auris NOT DETECTED NOT DETECTED Final   Candida glabrata NOT DETECTED NOT DETECTED Final   Candida krusei NOT DETECTED NOT DETECTED Final   Candida parapsilosis NOT DETECTED NOT DETECTED Final   Candida tropicalis NOT DETECTED NOT DETECTED Final   Cryptococcus neoformans/gattii NOT DETECTED NOT DETECTED Final   CTX-M ESBL NOT DETECTED NOT DETECTED Final   Carbapenem resistance IMP NOT DETECTED NOT DETECTED Final   Carbapenem resistance KPC NOT DETECTED NOT DETECTED Final   Carbapenem resistance NDM NOT DETECTED NOT DETECTED Final   Carbapenem resist OXA 48 LIKE NOT DETECTED NOT DETECTED Final   Carbapenem resistance VIM NOT DETECTED NOT DETECTED Final    Comment: Performed at [facility], [address] [place], [place] [address]  Culture, blood (Routine x 2)     Status:  Abnormal   Collection Time: 11/24/20  5:09 PM   Specimen: BLOOD LEFT ARM  Result Value Ref Range Status   Specimen Description   Final    BLOOD LEFT ARM Performed at [facility], [place] [address] [place], [place] [address]    Special Requests   Final    BOTTLES DRAWN AEROBIC AND ANAEROBIC Blood Culture adequate volume Performed at [facility] [address] [place], [place] [address]    Culture  Setup Time   Final    GRAM POSITIVE COCCI GRAM NEGATIVE RODS Organism ID to follow IN BOTH AEROBIC AND ANAEROBIC BOTTLES CRITICAL RESULT CALLED TO, READ BACK BY AND VERIFIED WITH: PHARMD A.PHAN AT 1239 ON 11/25/2020 BY T.SAAD.    Culture (A)  Final    ENTEROBACTER ASBURIAE STAPHYLOCOCCUS EPIDERMIDIS THE SIGNIFICANCE OF ISOLATING THIS ORGANISM FROM A SINGLE SET OF BLOOD CULTURES WHEN MULTIPLE SETS ARE DRAWN IS UNCERTAIN. PLEASE NOTIFY THE MICROBIOLOGY DEPARTMENT WITHIN ONE WEEK IF SPECIATION AND SENSITIVITIES ARE REQUIRED. Performed at [facility], [place] [address], [place], [place] [address]    Report Status 11/27/2020 FINAL  Final   Organism ID, Bacteria ENTEROBACTER ASBURIAE  Final      Susceptibility   Enterobacter asburiae - MIC*    CEFAZOLIN >=64 RESISTANT Resistant     CEFEPIME <=0.12 SENSITIVE Sensitive     CEFTAZIDIME <=1 SENSITIVE Sensitive     CEFTRIAXONE <=0.25 SENSITIVE Sensitive     CIPROFLOXACIN <=0.25 SENSITIVE Sensitive     GENTAMICIN <=1 SENSITIVE Sensitive     IMIPENEM <=0.25 SENSITIVE Sensitive     TRIMETH/SULFA <=20 SENSITIVE Sensitive     PIP/TAZO <=4 SENSITIVE Sensitive     * ENTEROBACTER ASBURIAE  Blood Culture ID Panel (Reflexed)     Status: Abnormal   Collection Time: 11/24/20  5:09 PM  Result Value Ref Range Status   Enterococcus faecalis NOT DETECTED NOT DETECTED Final  Enterococcus Faecium NOT DETECTED NOT DETECTED Final   Listeria monocytogenes NOT DETECTED NOT DETECTED Final   Staphylococcus species DETECTED (A) NOT  DETECTED Final    Comment: CRITICAL RESULT CALLED TO, READ BACK BY AND VERIFIED WITH: PHARMD A.PHAN AT 1239 ON 11/25/2020 BY T.SAAD.    Staphylococcus aureus (BCID) NOT DETECTED NOT DETECTED Final   Staphylococcus epidermidis DETECTED (A) NOT DETECTED Final    Comment: Methicillin (oxacillin) resistant coagulase negative staphylococcus. Possible blood culture contaminant (unless isolated from more than one blood culture draw or clinical case suggests pathogenicity). No antibiotic treatment is indicated for blood  culture contaminants. CRITICAL RESULT CALLED TO, READ BACK BY AND VERIFIED WITH: PHARMD A.PHAN AT 1239 ON 11/25/2020 BY T.SAAD.    Staphylococcus lugdunensis NOT DETECTED NOT DETECTED Final   Streptococcus species NOT DETECTED NOT DETECTED Final   Streptococcus agalactiae NOT DETECTED NOT DETECTED Final   Streptococcus pneumoniae NOT DETECTED NOT DETECTED Final   Streptococcus pyogenes NOT DETECTED NOT DETECTED Final   A.calcoaceticus-baumannii NOT DETECTED NOT DETECTED Final   Bacteroides fragilis NOT DETECTED NOT DETECTED Final   Enterobacterales DETECTED (A) NOT DETECTED Final    Comment: Enterobacterales represent a large order of gram negative bacteria, not a single organism. CRITICAL RESULT CALLED TO, READ BACK BY AND VERIFIED WITH: PHARMD A.PHAN AT 1239 ON 11/25/2020 BY T.SAAD.    Enterobacter cloacae complex DETECTED (A) NOT DETECTED Final    Comment: CRITICAL RESULT CALLED TO, READ BACK BY AND VERIFIED WITH: PHARMD A.PHAN AT 1239 ON 11/25/2020 BY T.SAAD.    Escherichia coli NOT DETECTED NOT DETECTED Final   Klebsiella aerogenes NOT DETECTED NOT DETECTED Final   Klebsiella oxytoca NOT DETECTED NOT DETECTED Final   Klebsiella pneumoniae NOT DETECTED NOT DETECTED Final   Proteus species NOT DETECTED NOT DETECTED Final   Salmonella species NOT DETECTED NOT DETECTED Final   Serratia marcescens NOT DETECTED NOT DETECTED Final   Haemophilus influenzae NOT DETECTED NOT  DETECTED Final   Neisseria meningitidis NOT DETECTED NOT DETECTED Final   Pseudomonas aeruginosa NOT DETECTED NOT DETECTED Final   Stenotrophomonas maltophilia NOT DETECTED NOT DETECTED Final   Candida albicans NOT DETECTED NOT DETECTED Final   Candida auris NOT DETECTED NOT DETECTED Final   Candida glabrata NOT DETECTED NOT DETECTED Final   Candida krusei NOT DETECTED NOT DETECTED Final   Candida parapsilosis NOT DETECTED NOT DETECTED Final   Candida tropicalis NOT DETECTED NOT DETECTED Final   Cryptococcus neoformans/gattii NOT DETECTED NOT DETECTED Final   CTX-M ESBL NOT DETECTED NOT DETECTED Final   Carbapenem resistance IMP NOT DETECTED NOT DETECTED Final   Carbapenem resistance KPC NOT DETECTED NOT DETECTED Final   Methicillin resistance mecA/C DETECTED (A) NOT DETECTED Final    Comment: CRITICAL RESULT CALLED TO, READ BACK BY AND VERIFIED WITH: PHARMD A.PHAN AT 1239 ON 11/25/2020 BY T.SAAD.    Carbapenem resistance NDM NOT DETECTED NOT DETECTED Final   Carbapenem resist OXA 48 LIKE NOT DETECTED NOT DETECTED Final   Carbapenem resistance VIM NOT DETECTED NOT DETECTED Final    Comment: Performed at [facility], [place] [address], [place], [place] [address]  Resp Panel by RT-PCR (Flu A&B, Covid) Nasopharyngeal Swab     Status: None   Collection Time: 11/24/20  5:12 PM   Specimen: Nasopharyngeal Swab; Nasopharyngeal(NP) swabs in vial transport medium  Result Value Ref Range Status   SARS Coronavirus 2 by RT PCR NEGATIVE NEGATIVE Final    Comment: (NOTE) SARS-CoV-2 target nucleic acids are  NOT DETECTED.  The SARS-CoV-2 RNA is generally detectable in upper respiratory specimens during the acute phase of infection. The lowest concentration of SARS-CoV-2 viral copies this assay can detect is 138 copies/mL. A negative result does not preclude SARS-Cov-2 infection and should not be used as the sole basis for treatment or other patient management decisions. A negative result may  occur with  improper specimen collection/handling, submission of specimen other than nasopharyngeal swab, presence of viral mutation(s) within the areas targeted by this assay, and inadequate number of viral copies(<138 copies/mL). A negative result must be combined with clinical observations, patient history, and epidemiological information. The expected result is Negative.  Fact Sheet for Patients:  EntrepreneurPulse.com.au  Fact Sheet for Healthcare Providers:  IncredibleEmployment.be  This test is no t yet approved or cleared by the [place] FDA and  has been authorized for detection and/or diagnosis of SARS-CoV-2 by FDA under an Emergency Use Authorization (EUA). This EUA will remain  in effect (meaning this test can be used) for the duration of the COVID-19 declaration under Section 564(b)(1) of the Act, 21 U.S.C.section 360bbb-3(b)(1), unless the authorization is terminated  or revoked sooner.       Influenza A by PCR NEGATIVE NEGATIVE Final   Influenza B by PCR NEGATIVE NEGATIVE Final    Comment: (NOTE) The Xpert Xpress SARS-CoV-2/FLU/RSV plus assay is intended as an aid in the diagnosis of influenza from Nasopharyngeal swab specimens and should not be used as a sole basis for treatment. Nasal washings and aspirates are unacceptable for Xpert Xpress SARS-CoV-2/FLU/RSV testing.  Fact Sheet for Patients: EntrepreneurPulse.com.au  Fact Sheet for Healthcare Providers: IncredibleEmployment.be  This test is not yet approved or cleared by the [place] FDA and has been authorized for detection and/or diagnosis of SARS-CoV-2 by FDA under an Emergency Use Authorization (EUA). This EUA will remain in effect (meaning this test can be used) for the duration of the COVID-19 declaration under Section 564(b)(1) of the Act, 21 U.S.C. section 360bbb-3(b)(1), unless the authorization is terminated  or revoked.  Performed at [facility], [place] [address] [place], [place] [address]   Urine culture     Status: Abnormal   Collection Time: 11/24/20  8:30 PM   Specimen: In/Out Cath Urine  Result Value Ref Range Status   Specimen Description   Final    IN/OUT CATH URINE Performed at [facility] [address], [place], [place] [address]    Special Requests   Final    NONE Performed at [facility], [place] [address], [place], [place] [address]    Culture 60,000 COLONIES/mL ENTEROBACTER SPECIES (A)  Final   Report Status 11/27/2020 FINAL  Final   Organism ID, Bacteria ENTEROBACTER SPECIES (A)  Final      Susceptibility   Enterobacter species - MIC*    CEFAZOLIN >=64 RESISTANT Resistant     CEFEPIME <=0.12 SENSITIVE Sensitive     CEFTRIAXONE <=0.25 SENSITIVE Sensitive     CIPROFLOXACIN <=0.25 SENSITIVE Sensitive     GENTAMICIN <=1 SENSITIVE Sensitive     IMIPENEM <=0.25 SENSITIVE Sensitive     NITROFURANTOIN <=16 SENSITIVE Sensitive     TRIMETH/SULFA <=20 SENSITIVE Sensitive     PIP/TAZO <=4 SENSITIVE Sensitive     * 60,000 COLONIES/mL ENTEROBACTER SPECIES  Culture, blood (routine x 2)     Status: None   Collection Time: 11/27/20  5:37 AM   Specimen: BLOOD LEFT ARM  Result Value Ref Range Status  Specimen Description   Final    BLOOD LEFT ARM Performed at [facility] [address] [place], [place] [address]    Special Requests   Final    BOTTLES DRAWN AEROBIC ONLY Blood Culture adequate volume Performed at [facility] [address], [place], [place] [address]    Culture   Final    NO GROWTH 5 DAYS Performed at [facility], [place] [address], [place], [place] [address]    Report Status 12/02/2020 FINAL  Final  Culture, blood (routine x 2)     Status: None   Collection Time: 11/27/20  5:37 AM   Specimen: BLOOD  Result Value Ref Range Status   Specimen Description    Final    BLOOD LEFT WRIST Performed at [facility] [address], [place], [place] [address]    Special Requests   Final    BOTTLES DRAWN AEROBIC ONLY Blood Culture adequate volume Performed at [facility] [address], [place], [place] [address]    Culture   Final    NO GROWTH 5 DAYS Performed at [facility], [place] [address], [place], [place] [address]    Report Status 12/02/2020 FINAL  Final     Labs: Basic Metabolic Panel: Recent Labs  Lab 11/28/20 0539 11/30/20 0643 12/01/20 1021 12/02/20 0858  NA 142 143 143 141  K 3.6 3.7 4.2 4.5  CL 111 111 109 109  CO2 24 27 29 28   GLUCOSE 138* 135* 166* 105*  BUN 55* 48* 53* 48*  CREATININE 1.66* 1.32* 1.45* 1.37*  CALCIUM 7.8* 8.2* 8.4* 8.1*  MG 2.4  --   --   --    Liver Function Tests: Recent Labs  Lab 11/28/20 0539  AST 25  ALT <5  ALKPHOS 50  BILITOT 0.3  PROT 5.4*  ALBUMIN 2.0*   No results for input(s): LIPASE, AMYLASE in the last 168 hours. No results for input(s): AMMONIA in the last 168 hours. CBC: Recent Labs  Lab 11/28/20 0539 11/30/20 0643  WBC 6.9 10.3  NEUTROABS 5.6  --   HGB 7.9* 8.7*  HCT 24.4* 25.9*  MCV 100.8* 99.2  PLT 107* 147*   Cardiac Enzymes: No results for input(s): CKTOTAL, CKMB, CKMBINDEX, TROPONINI in the last 168 hours. BNP: BNP (last 3 results) No results for input(s): BNP in the last 8760 hours.  ProBNP (last 3 results) No results for input(s): PROBNP in the last 8760 hours.  CBG: Recent Labs  Lab 11/27/20 1748 11/28/20 0003 11/28/20 0613 11/28/20 1150 11/29/20 0016  GLUCAP 137* 187* 129* 112* 152*       Signed:  Oswald Hillock MD.  [facility] 12/04/2020, 1:18 PM

## 2020-12-04 NOTE — TOC Transition Note (Signed)
Transition of Care [facility]) - CM/SW Discharge Note   Patient Details  Name: [name] MRN: [id number] Date of Birth: 05-21-1919  [facility] CM/SW Contact:  Lynnell Catalan, RN Phone Number: 12/04/2020, 3:08 PM   Clinical Narrative:    Pt to dc to [facility] today. [facility] contacted for transport. Yellow DNR on chart for transport.     Barriers to Discharge: Continued Medical Work up  Discharge Plan and Services   Discharge Planning Services: CM Consult                Readmission Risk Interventions No flowsheet data found.

## 2020-12-05 NOTE — Progress Notes (Signed)
I stopped in earlier in the day to provide prayer as his wife had requested.  That was not a good time, but I was able to be with them before transfer.  I provided prayer and presence to patient and family.  Ava, [place] Pager, [phone] 9:32 AM

## 2020-12-08 ENCOUNTER — Ambulatory Visit: Payer: Medicare PPO

## 2020-12-16 ENCOUNTER — Ambulatory Visit: Payer: Medicare PPO | Attending: Family Medicine

## 2021-06-23 IMAGING — CT CT HEAD W/O CM
4 series · 15 of 47 positions shown, 17 images · non-contrast
Comparison: 11/02/2006

CLINICAL DATA: Unwitnessed fall

EXAM:
CT HEAD WITHOUT CONTRAST
CT CERVICAL SPINE WITHOUT CONTRAST
TECHNIQUE: Multidetector CT imaging of the head and cervical spine was
performed following the standard protocol without intravenous
contrast. Multiplanar CT image reconstructions of the cervical spine
were also generated.

[Series 3: head wo · axial · 0.40mm/px · z∈[-154,-34]mm · 7 of 32 slices shown, 9 images]
[im 4/32  brain]
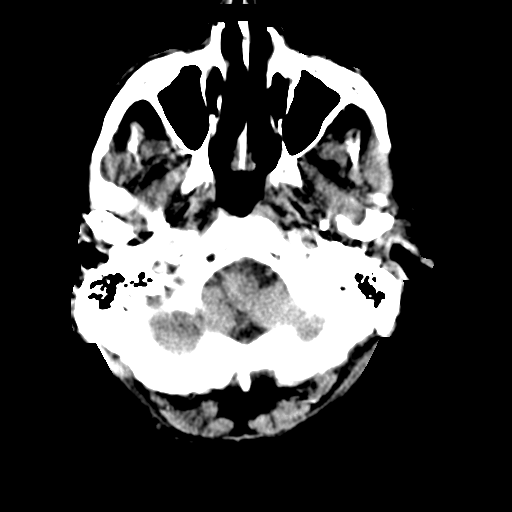
[im 4/32  bone]
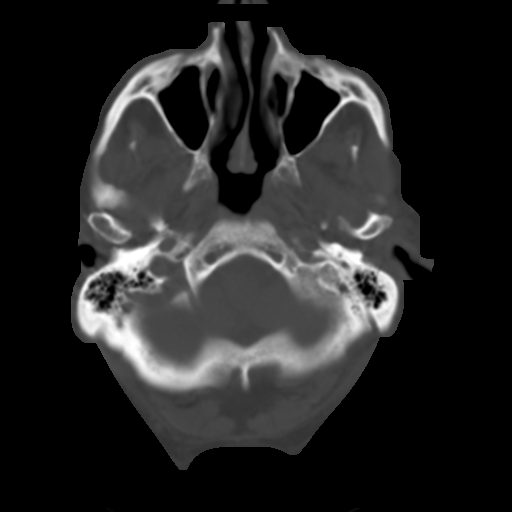
[im 8/32  brain]
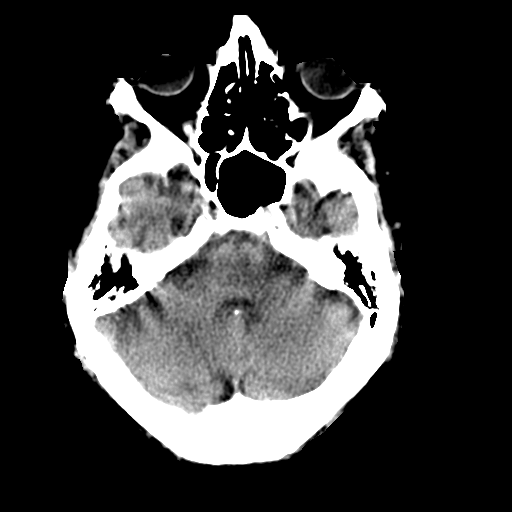
[im 12/32  brain]
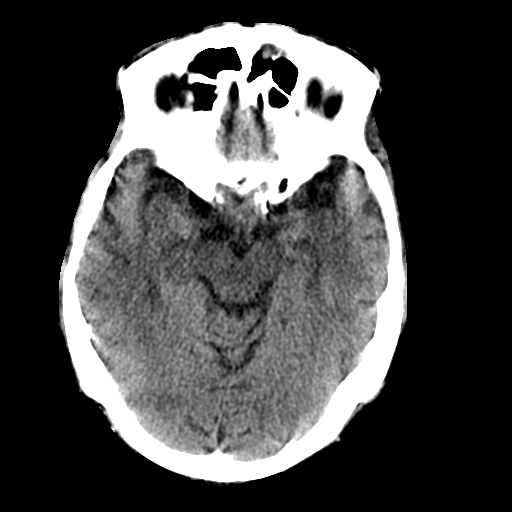
[im 16/32  brain]
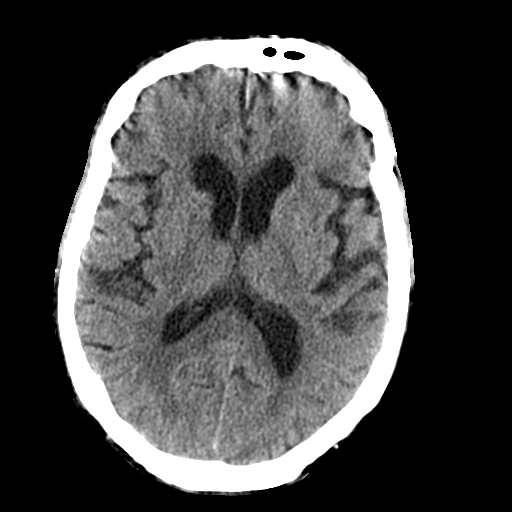
[im 20/32  brain]
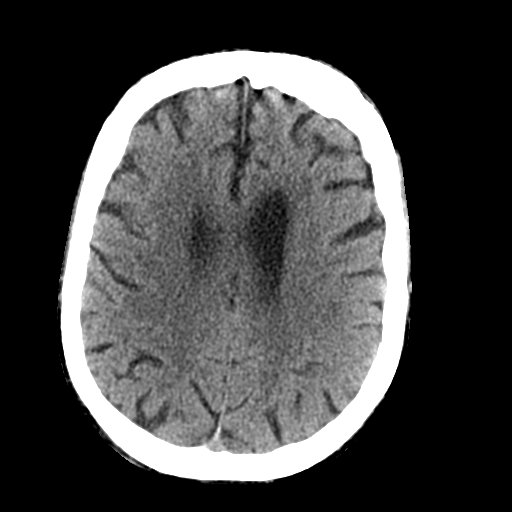
[im 20/32  bone]
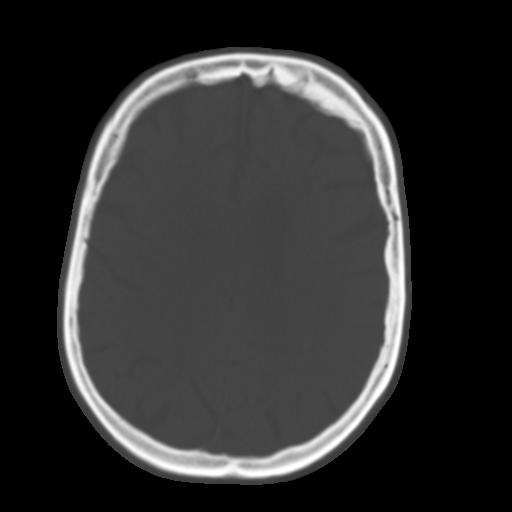
[im 24/32  brain]
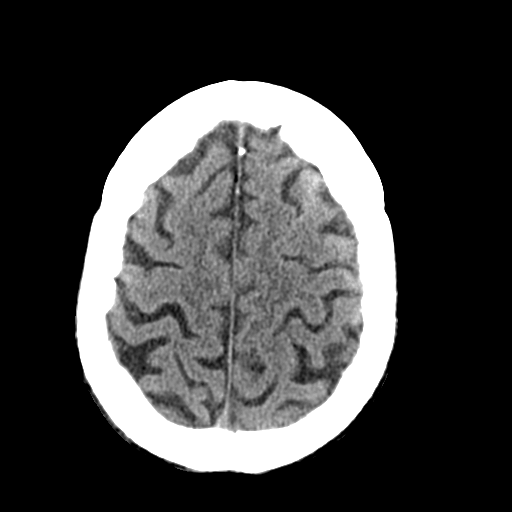
[im 28/32  brain]
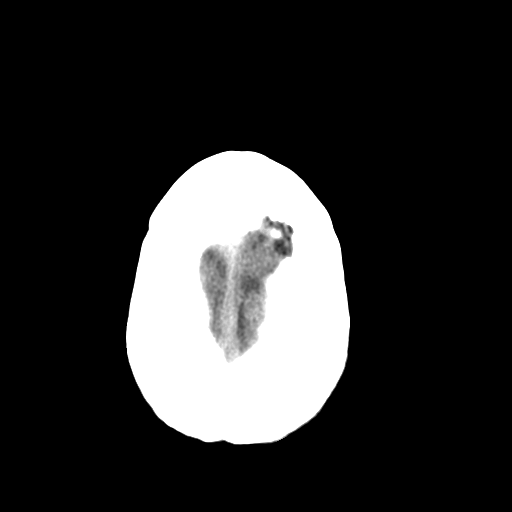

[Series 4: head bone · axial · 0.40mm/px · z∈[-155,-139]mm · 2 of 78 slices shown]
[im 8/78  bone]
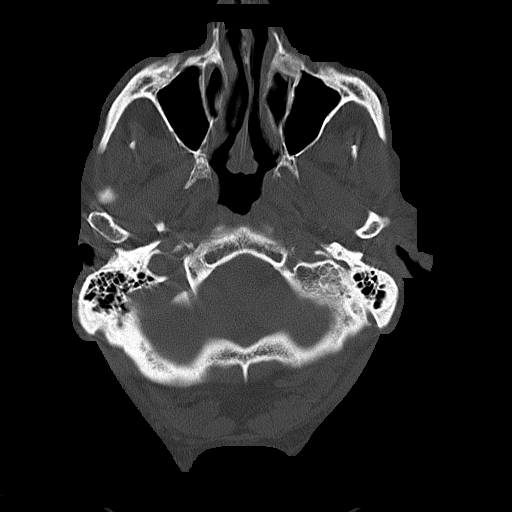
[im 16/78  bone]
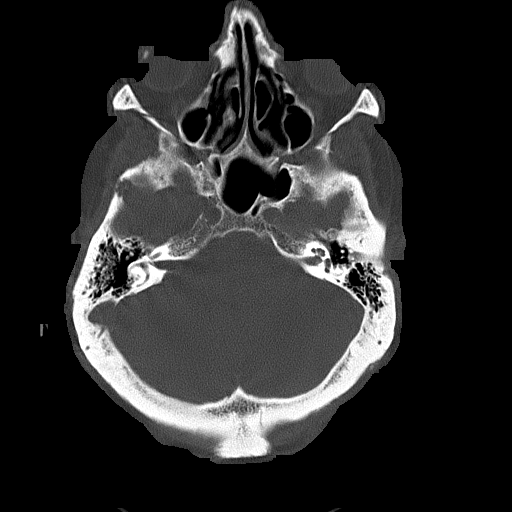

[Series 6: coronal soft tissue · coronal · 0.30mm/px · 3 of 67 slices shown]
[im 24/67  brain]
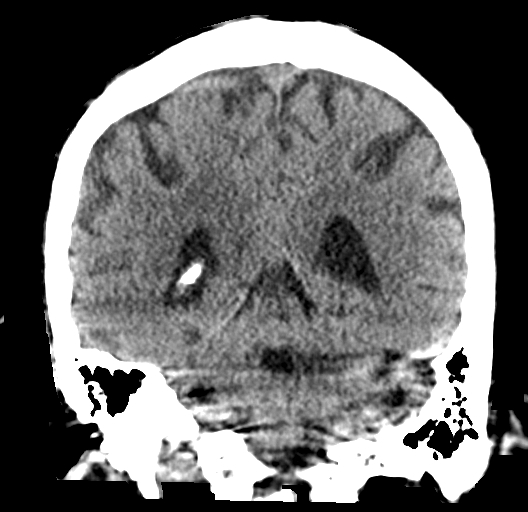
[im 30/67  brain]
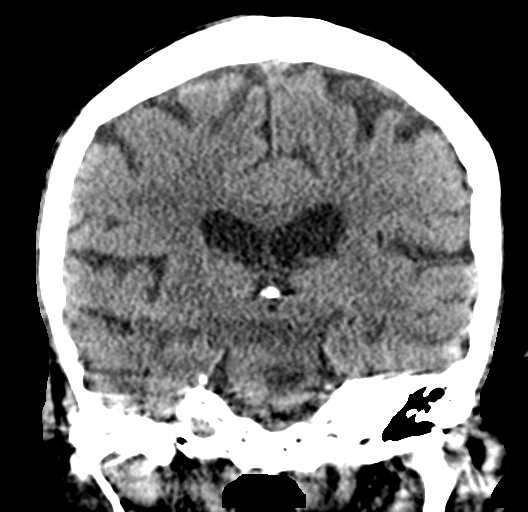
[im 37/67  brain]
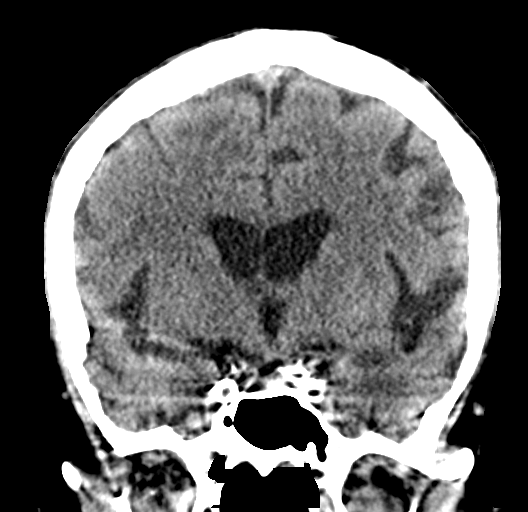

[Series 7: sagittal soft tissue · sagittal · 0.30mm/px · 3 of 54 slices shown]
[im 18/54  brain]
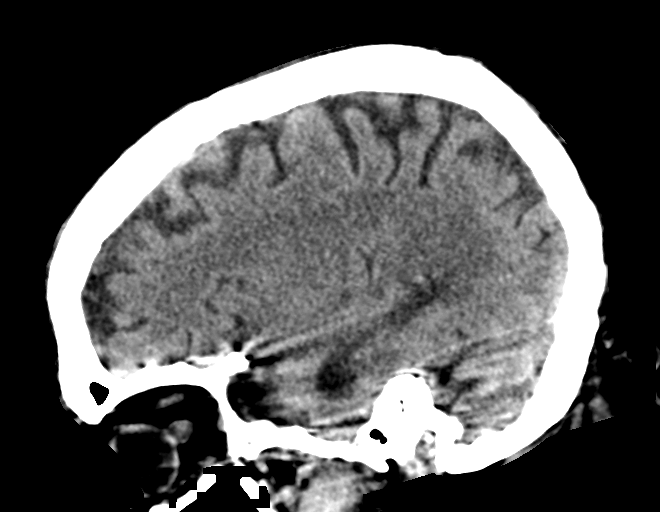
[im 27/54  brain]
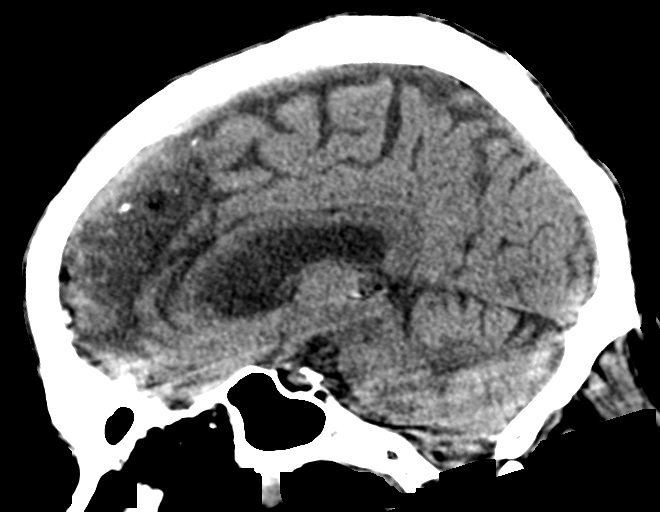
[im 36/54  brain]
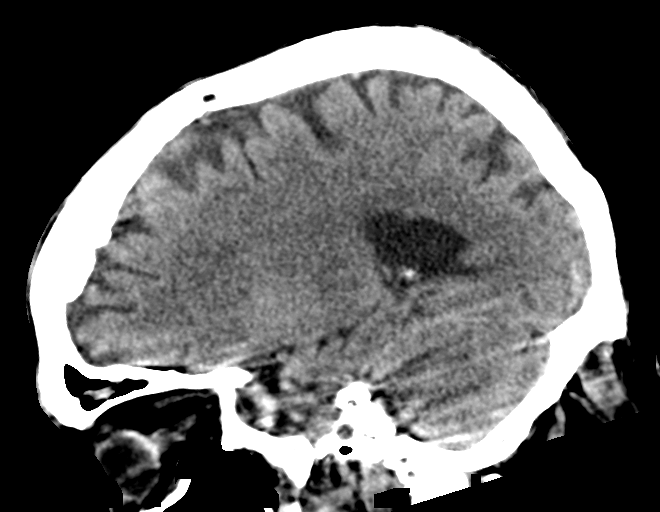

[15 of 47 positions shown; findings below may reference images not displayed]

FINDINGS: CT HEAD FINDINGS

Brain: No evidence of acute infarction, hemorrhage, hydrocephalus,
extra-axial collection or mass lesion/mass effect. Mild low-density
changes within the periventricular and subcortical white matter
compatible with chronic microvascular ischemic change. Mild diffuse
cerebral volume loss.

Vascular: Atherosclerotic calcifications involving the large vessels
of the skull base. No unexpected hyperdense vessel.

Skull: Normal. Negative for fracture or focal lesion.

Sinuses/Orbits: Partial opacification of the left frontal sinus.
Remaining paranasal sinuses and mastoid air cells are clear. Orbital
structures within normal limits.

Other: Negative for scalp hematoma.

CT CERVICAL SPINE FINDINGS

Alignment: Facet joints are aligned without dislocation or traumatic
listhesis. Dens and lateral masses are aligned. Straightening of the
cervical lordosis.

Skull base and vertebrae: No acute fracture. No primary bone lesion
or focal pathologic process. The right C2-3 facet joint is fused.

Soft tissues and spinal canal: No prevertebral fluid or swelling. No
visible canal hematoma.

Disc levels: Multilevel degenerative disc disease, most severe at
C5-6 where there is endplate osteophytosis and posterior
longitudinal ligament ossification resulting in at least moderate
canal stenosis. Multilevel facet and uncovertebral arthropathy is
also present throughout the cervical spine.

Upper chest: Negative.

Other: Bilateral carotid atherosclerosis.
IMPRESSION: 1. No evidence of acute intracranial process.
2. Mild chronic microvascular ischemic change and cerebral volume
loss.
3. No evidence of acute fracture or traumatic listhesis of the
cervical spine.
4. Multilevel degenerative disc disease and facet arthropathy of the
cervical spine, most severe at C5-6 where there is at least moderate
canal stenosis.

## 2021-06-25 IMAGING — DX DG CHEST 1V PORT
1 series · 1 of 1 positions shown · non-contrast
Comparison: Chest radiograph dated 11/25/2020

CLINICAL DATA: [AGE] male with hypoxia.

EXAM:
PORTABLE CHEST 1 VIEW

[chest ap]
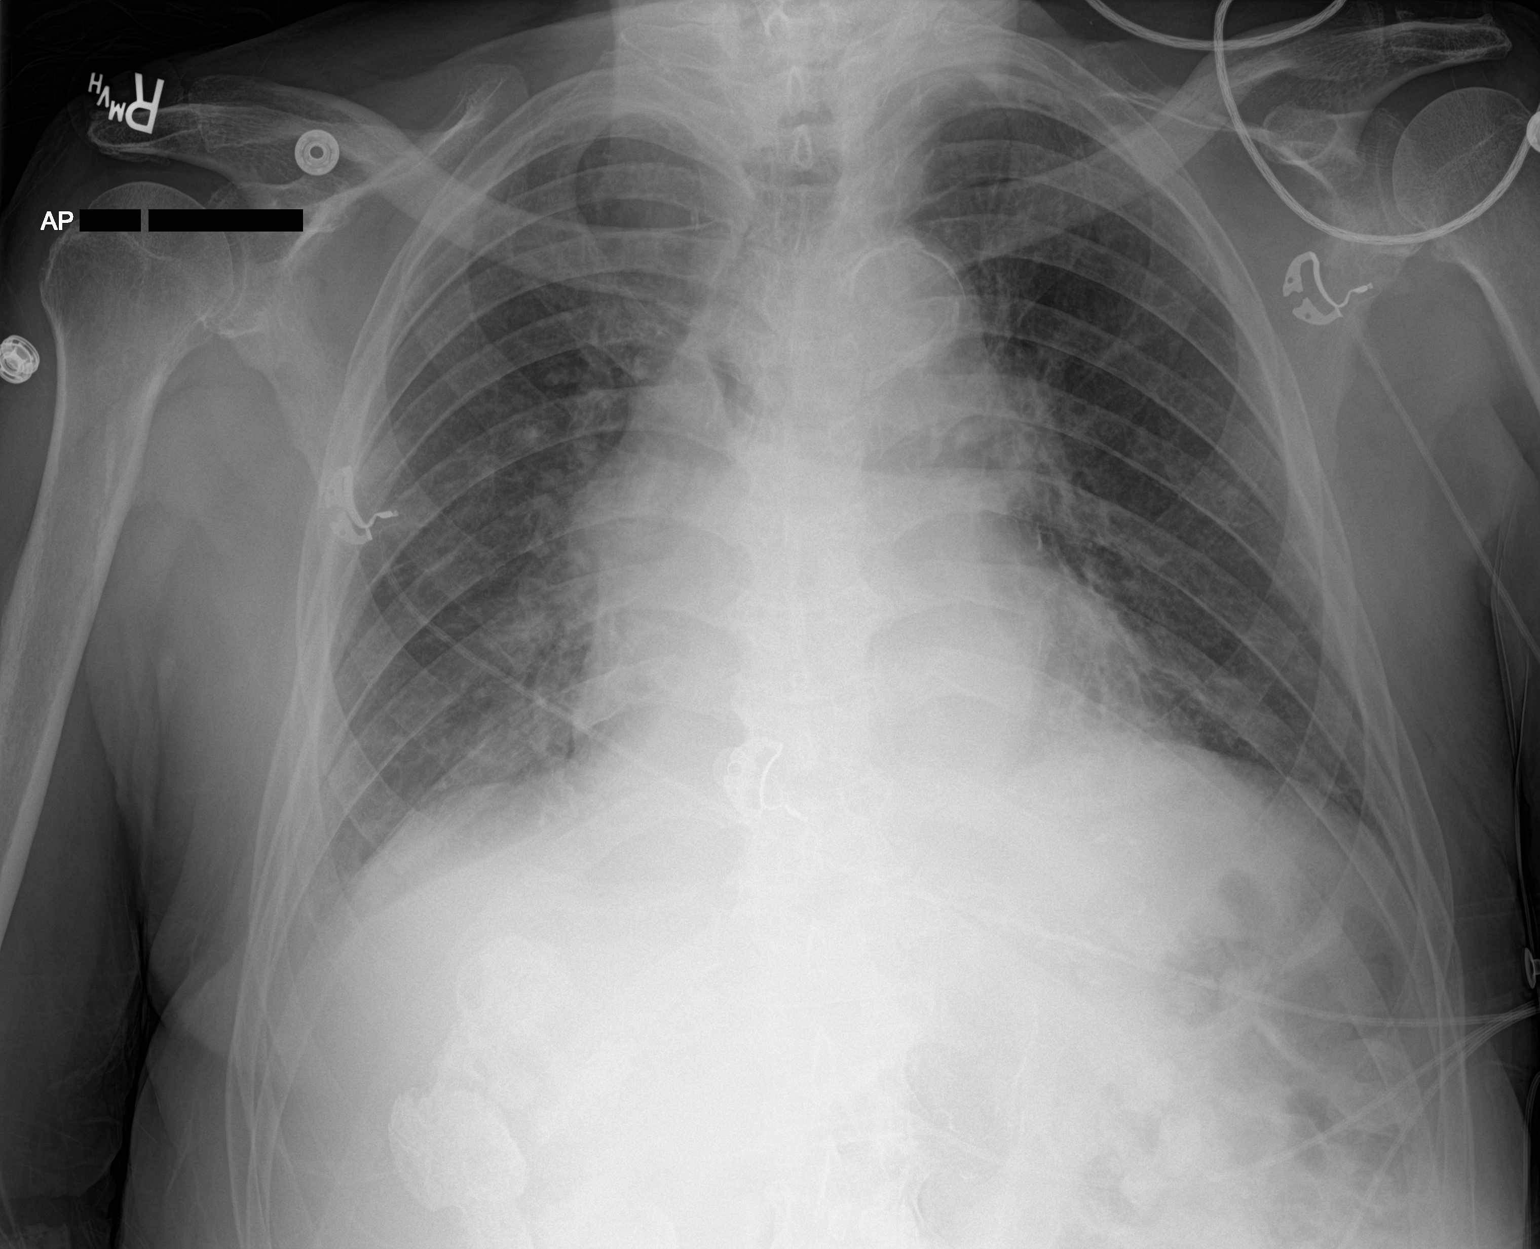

[1 of 1 positions shown; findings below may reference images not displayed]

FINDINGS: Minimal right lung base atelectasis. Trace right pleural effusion
may be present. No focal consolidation or pneumothorax. Mild
cardiomegaly. Atherosclerotic calcification of the aorta. No acute
osseous pathology.
IMPRESSION: 1. No focal consolidation.
2. Mild cardiomegaly.

## 2021-06-27 IMAGING — DX DG CHEST 1V PORT
1 series · 1 of 1 positions shown · non-contrast
Comparison: Portable chest 11/26/2020 and earlier.

CLINICAL DATA: [AGE] male with weakness and altered mental
status.

EXAM:
PORTABLE CHEST 1 VIEW

[chest ap]
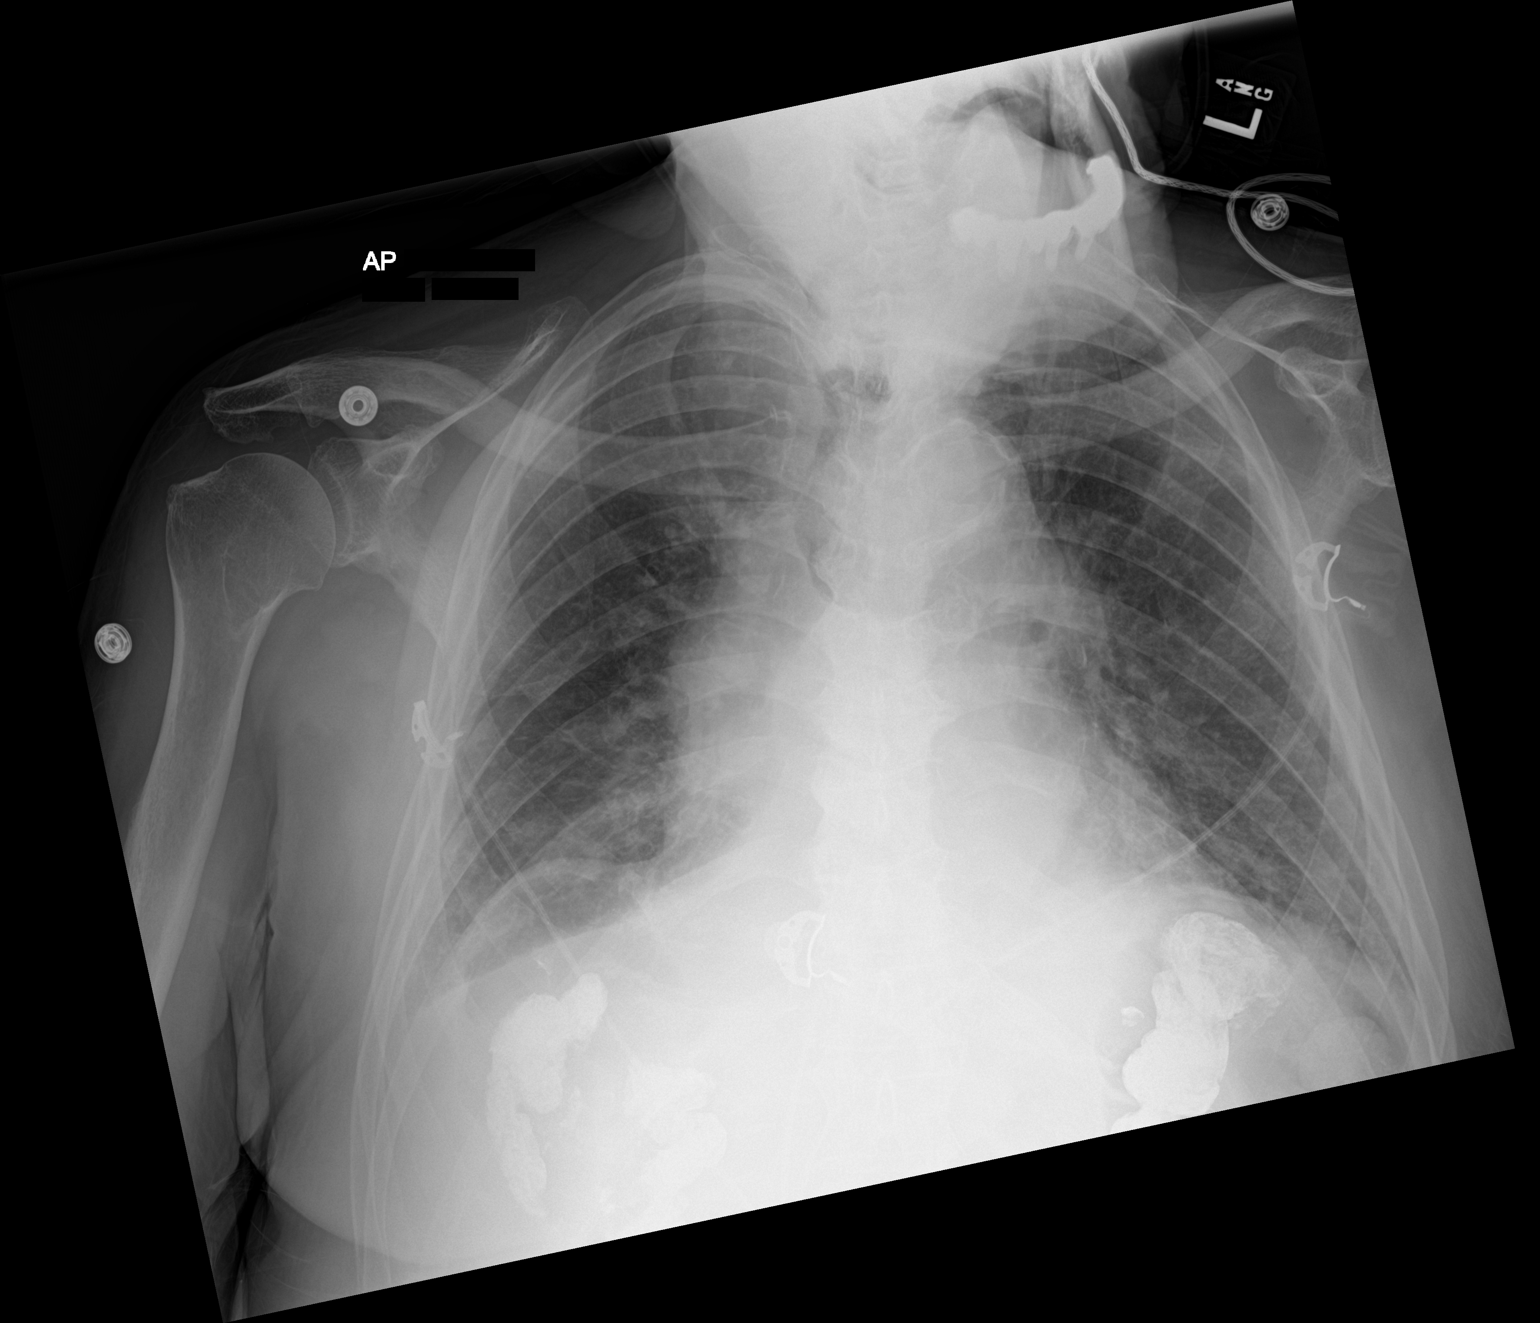

[1 of 1 positions shown; findings below may reference images not displayed]

FINDINGS: Portable AP semi upright view at 5688 hours. Stable low lung volumes
and mediastinal contours. Tortuous thoracic aorta. Visualized
tracheal air column is within normal limits. Mild lung base
atelectasis. Otherwise Allowing for portable technique the lungs are
clear. No pneumothorax. Oral contrast retained in nondilated large
bowel in the upper abdomen. No acute osseous abnormality identified.
IMPRESSION: Low lung volumes with mild atelectasis.

## 2022-04-14 DEATH — deceased
